# Patient Record
Sex: Female | Born: 1955
Health system: Southern US, Community
[De-identification: ages and names within clinical notes are randomized; demographics above are authoritative.]

## PROBLEM LIST (undated history)

## (undated) DIAGNOSIS — G609 Hereditary and idiopathic neuropathy, unspecified: Secondary | ICD-10-CM

## (undated) DIAGNOSIS — G43909 Migraine, unspecified, not intractable, without status migrainosus: Secondary | ICD-10-CM

## (undated) DIAGNOSIS — F32A Depression, unspecified: Secondary | ICD-10-CM

## (undated) DIAGNOSIS — G43719 Chronic migraine without aura, intractable, without status migrainosus: Secondary | ICD-10-CM

## (undated) DIAGNOSIS — G4710 Hypersomnia, unspecified: Secondary | ICD-10-CM

## (undated) DIAGNOSIS — N3281 Overactive bladder: Secondary | ICD-10-CM

## (undated) DIAGNOSIS — G4730 Sleep apnea, unspecified: Secondary | ICD-10-CM

## (undated) DIAGNOSIS — K219 Gastro-esophageal reflux disease without esophagitis: Secondary | ICD-10-CM

## (undated) DIAGNOSIS — G4700 Insomnia, unspecified: Secondary | ICD-10-CM

## (undated) DIAGNOSIS — E119 Type 2 diabetes mellitus without complications: Secondary | ICD-10-CM

## (undated) DIAGNOSIS — Z9889 Other specified postprocedural states: Secondary | ICD-10-CM

## (undated) DIAGNOSIS — F329 Major depressive disorder, single episode, unspecified: Secondary | ICD-10-CM

## (undated) DIAGNOSIS — IMO0001 Reserved for inherently not codable concepts without codable children: Secondary | ICD-10-CM

## (undated) DIAGNOSIS — M797 Fibromyalgia: Secondary | ICD-10-CM

## (undated) DIAGNOSIS — B1920 Unspecified viral hepatitis C without hepatic coma: Secondary | ICD-10-CM

## (undated) DIAGNOSIS — M545 Low back pain: Secondary | ICD-10-CM

## (undated) DIAGNOSIS — T7840XA Allergy, unspecified, initial encounter: Secondary | ICD-10-CM

## (undated) DIAGNOSIS — F17200 Nicotine dependence, unspecified, uncomplicated: Secondary | ICD-10-CM

## (undated) DIAGNOSIS — M1990 Unspecified osteoarthritis, unspecified site: Secondary | ICD-10-CM

## (undated) HISTORY — PX: HIP SURGERY: SHX245

## (undated) HISTORY — DX: Depression, unspecified: F32.A

## (undated) HISTORY — PX: PELVIC LAPAROSCOPY: SHX162

## (undated) HISTORY — DX: Chronic migraine without aura, intractable, without status migrainosus: G43.719

## (undated) HISTORY — DX: Other specified postprocedural states: Z98.890

## (undated) HISTORY — DX: Overactive bladder: N32.81

## (undated) HISTORY — DX: Unspecified viral hepatitis C without hepatic coma: B19.20

## (undated) HISTORY — DX: Fibromyalgia: M79.7

## (undated) HISTORY — DX: Low back pain: M54.5

## (undated) HISTORY — PX: SINUS SURGERY WITH INSTATRAK: SHX5215

## (undated) HISTORY — PX: COLONOSCOPY: SHX174

## (undated) HISTORY — DX: Insomnia, unspecified: G47.00

## (undated) HISTORY — DX: Sleep apnea, unspecified: G47.30

## (undated) HISTORY — DX: Gastro-esophageal reflux disease without esophagitis: K21.9

## (undated) HISTORY — DX: Unspecified osteoarthritis, unspecified site: M19.90

## (undated) HISTORY — DX: Allergy, unspecified, initial encounter: T78.40XA

## (undated) HISTORY — PX: FOOT SURGERY: SHX648

## (undated) HISTORY — DX: Hereditary and idiopathic neuropathy, unspecified: G60.9

## (undated) HISTORY — DX: Nicotine dependence, unspecified, uncomplicated: F17.200

## (undated) HISTORY — PX: EXPLORATORY LAPAROTOMY: SUR591

## (undated) HISTORY — DX: Migraine, unspecified, not intractable, without status migrainosus: G43.909

## (undated) HISTORY — DX: Type 2 diabetes mellitus without complications: E11.9

## (undated) HISTORY — DX: Reserved for inherently not codable concepts without codable children: IMO0001

## (undated) HISTORY — DX: Hypersomnia, unspecified: G47.10

## (undated) HISTORY — DX: Major depressive disorder, single episode, unspecified: F32.9

---

## 1978-01-13 DIAGNOSIS — B1920 Unspecified viral hepatitis C without hepatic coma: Secondary | ICD-10-CM

## 1978-01-13 HISTORY — DX: Unspecified viral hepatitis C without hepatic coma: B19.20

## 1997-08-06 ENCOUNTER — Inpatient Hospital Stay (HOSPITAL_COMMUNITY): Admission: AD | Admit: 1997-08-06 | Discharge: 1997-08-06 | Payer: Self-pay | Admitting: Gynecology

## 1997-08-17 ENCOUNTER — Ambulatory Visit (HOSPITAL_COMMUNITY): Admission: RE | Admit: 1997-08-17 | Discharge: 1997-08-17 | Payer: Self-pay | Admitting: Gynecology

## 1997-08-29 ENCOUNTER — Inpatient Hospital Stay (HOSPITAL_COMMUNITY): Admission: AD | Admit: 1997-08-29 | Discharge: 1997-08-29 | Payer: Self-pay | Admitting: Obstetrics and Gynecology

## 1997-10-25 ENCOUNTER — Inpatient Hospital Stay (HOSPITAL_COMMUNITY): Admission: AD | Admit: 1997-10-25 | Discharge: 1997-10-25 | Payer: Self-pay | Admitting: Gynecology

## 1997-10-26 ENCOUNTER — Inpatient Hospital Stay (HOSPITAL_COMMUNITY): Admission: AD | Admit: 1997-10-26 | Discharge: 1997-10-28 | Payer: Self-pay | Admitting: Gynecology

## 1997-11-02 ENCOUNTER — Encounter: Payer: Self-pay | Admitting: Obstetrics and Gynecology

## 1997-11-02 ENCOUNTER — Ambulatory Visit (HOSPITAL_COMMUNITY): Admission: RE | Admit: 1997-11-02 | Discharge: 1997-11-02 | Payer: Self-pay | Admitting: Obstetrics and Gynecology

## 1997-11-24 ENCOUNTER — Other Ambulatory Visit: Admission: RE | Admit: 1997-11-24 | Discharge: 1997-11-24 | Payer: Self-pay | Admitting: Gynecology

## 1999-01-14 HISTORY — PX: OTHER SURGICAL HISTORY: SHX169

## 1999-05-21 ENCOUNTER — Ambulatory Visit (HOSPITAL_COMMUNITY): Admission: RE | Admit: 1999-05-21 | Discharge: 1999-05-21 | Payer: Self-pay

## 1999-10-04 ENCOUNTER — Ambulatory Visit (HOSPITAL_COMMUNITY): Admission: RE | Admit: 1999-10-04 | Discharge: 1999-10-04 | Payer: Self-pay | Admitting: Gynecology

## 1999-10-04 ENCOUNTER — Encounter: Payer: Self-pay | Admitting: Gynecology

## 1999-10-08 ENCOUNTER — Other Ambulatory Visit: Admission: RE | Admit: 1999-10-08 | Discharge: 1999-10-08 | Payer: Self-pay | Admitting: Gynecology

## 1999-10-10 ENCOUNTER — Encounter: Payer: Self-pay | Admitting: Gynecology

## 1999-10-10 ENCOUNTER — Ambulatory Visit (HOSPITAL_COMMUNITY): Admission: RE | Admit: 1999-10-10 | Discharge: 1999-10-10 | Payer: Self-pay | Admitting: Gynecology

## 1999-10-22 ENCOUNTER — Encounter: Payer: Self-pay | Admitting: Gynecology

## 1999-10-22 ENCOUNTER — Ambulatory Visit (HOSPITAL_COMMUNITY): Admission: RE | Admit: 1999-10-22 | Discharge: 1999-10-22 | Payer: Self-pay | Admitting: Gynecology

## 1999-10-23 ENCOUNTER — Encounter (INDEPENDENT_AMBULATORY_CARE_PROVIDER_SITE_OTHER): Payer: Self-pay

## 1999-10-23 ENCOUNTER — Other Ambulatory Visit: Admission: RE | Admit: 1999-10-23 | Discharge: 1999-10-23 | Payer: Self-pay | Admitting: Gynecology

## 1999-10-29 ENCOUNTER — Encounter (INDEPENDENT_AMBULATORY_CARE_PROVIDER_SITE_OTHER): Payer: Self-pay | Admitting: *Deleted

## 1999-10-29 ENCOUNTER — Ambulatory Visit (HOSPITAL_COMMUNITY): Admission: RE | Admit: 1999-10-29 | Discharge: 1999-10-29 | Payer: Self-pay | Admitting: Gastroenterology

## 1999-10-29 DIAGNOSIS — Z9889 Other specified postprocedural states: Secondary | ICD-10-CM

## 1999-10-29 HISTORY — DX: Other specified postprocedural states: Z98.890

## 1999-10-30 ENCOUNTER — Encounter (INDEPENDENT_AMBULATORY_CARE_PROVIDER_SITE_OTHER): Payer: Self-pay | Admitting: Specialist

## 1999-10-30 ENCOUNTER — Inpatient Hospital Stay (HOSPITAL_COMMUNITY): Admission: RE | Admit: 1999-10-30 | Discharge: 1999-11-02 | Payer: Self-pay | Admitting: Gynecology

## 1999-10-30 ENCOUNTER — Encounter (INDEPENDENT_AMBULATORY_CARE_PROVIDER_SITE_OTHER): Payer: Self-pay

## 1999-10-30 HISTORY — PX: ABDOMINAL HYSTERECTOMY: SHX81

## 2001-02-17 ENCOUNTER — Other Ambulatory Visit: Admission: RE | Admit: 2001-02-17 | Discharge: 2001-02-17 | Payer: Self-pay | Admitting: Gynecology

## 2001-03-23 ENCOUNTER — Encounter: Admission: RE | Admit: 2001-03-23 | Discharge: 2001-03-23 | Payer: Self-pay | Admitting: Gynecology

## 2001-03-23 ENCOUNTER — Encounter: Payer: Self-pay | Admitting: Gynecology

## 2002-05-04 ENCOUNTER — Other Ambulatory Visit: Admission: RE | Admit: 2002-05-04 | Discharge: 2002-05-04 | Payer: Self-pay | Admitting: Gynecology

## 2003-10-23 ENCOUNTER — Other Ambulatory Visit: Admission: RE | Admit: 2003-10-23 | Discharge: 2003-10-23 | Payer: Self-pay | Admitting: Gynecology

## 2004-01-09 ENCOUNTER — Ambulatory Visit (HOSPITAL_COMMUNITY): Admission: RE | Admit: 2004-01-09 | Discharge: 2004-01-09 | Payer: Self-pay | Admitting: Neurology

## 2004-12-26 ENCOUNTER — Ambulatory Visit (HOSPITAL_COMMUNITY): Admission: RE | Admit: 2004-12-26 | Discharge: 2004-12-26 | Payer: Self-pay | Admitting: Preventative Medicine

## 2005-07-28 ENCOUNTER — Encounter: Admission: RE | Admit: 2005-07-28 | Discharge: 2005-07-28 | Payer: Self-pay | Admitting: Gynecology

## 2005-08-01 ENCOUNTER — Other Ambulatory Visit: Admission: RE | Admit: 2005-08-01 | Discharge: 2005-08-01 | Payer: Self-pay | Admitting: Gynecology

## 2005-08-15 ENCOUNTER — Encounter: Admission: RE | Admit: 2005-08-15 | Discharge: 2005-08-15 | Payer: Self-pay | Admitting: Gastroenterology

## 2006-03-26 ENCOUNTER — Ambulatory Visit (HOSPITAL_COMMUNITY): Admission: RE | Admit: 2006-03-26 | Discharge: 2006-03-26 | Payer: Self-pay | Admitting: Neurology

## 2006-09-22 ENCOUNTER — Other Ambulatory Visit: Admission: RE | Admit: 2006-09-22 | Discharge: 2006-09-22 | Payer: Self-pay | Admitting: Gynecology

## 2006-10-26 ENCOUNTER — Encounter: Admission: RE | Admit: 2006-10-26 | Discharge: 2006-10-26 | Payer: Self-pay | Admitting: Gynecology

## 2006-10-30 ENCOUNTER — Encounter: Admission: RE | Admit: 2006-10-30 | Discharge: 2006-10-30 | Payer: Self-pay | Admitting: Gynecology

## 2007-08-02 ENCOUNTER — Emergency Department (HOSPITAL_COMMUNITY): Admission: EM | Admit: 2007-08-02 | Discharge: 2007-08-02 | Payer: Self-pay | Admitting: Emergency Medicine

## 2007-10-12 ENCOUNTER — Emergency Department (HOSPITAL_COMMUNITY): Admission: EM | Admit: 2007-10-12 | Discharge: 2007-10-12 | Payer: Self-pay | Admitting: Emergency Medicine

## 2007-12-28 ENCOUNTER — Emergency Department (HOSPITAL_COMMUNITY): Admission: EM | Admit: 2007-12-28 | Discharge: 2007-12-28 | Payer: Self-pay | Admitting: Emergency Medicine

## 2008-05-07 ENCOUNTER — Encounter: Payer: Self-pay | Admitting: Orthopedic Surgery

## 2008-05-07 ENCOUNTER — Emergency Department (HOSPITAL_COMMUNITY): Admission: EM | Admit: 2008-05-07 | Discharge: 2008-05-07 | Payer: Self-pay | Admitting: Emergency Medicine

## 2008-05-10 ENCOUNTER — Ambulatory Visit: Payer: Self-pay | Admitting: Orthopedic Surgery

## 2008-05-10 DIAGNOSIS — S52539A Colles' fracture of unspecified radius, initial encounter for closed fracture: Secondary | ICD-10-CM | POA: Insufficient documentation

## 2008-05-17 ENCOUNTER — Ambulatory Visit: Payer: Self-pay | Admitting: Orthopedic Surgery

## 2008-05-25 ENCOUNTER — Ambulatory Visit: Payer: Self-pay | Admitting: Orthopedic Surgery

## 2008-05-31 ENCOUNTER — Ambulatory Visit: Payer: Self-pay | Admitting: Orthopedic Surgery

## 2008-06-22 ENCOUNTER — Ambulatory Visit: Payer: Self-pay | Admitting: Orthopedic Surgery

## 2008-07-26 ENCOUNTER — Ambulatory Visit: Payer: Self-pay | Admitting: Orthopedic Surgery

## 2008-08-02 ENCOUNTER — Encounter (HOSPITAL_COMMUNITY): Admission: RE | Admit: 2008-08-02 | Discharge: 2008-09-01 | Payer: Self-pay | Admitting: Orthopedic Surgery

## 2008-08-07 ENCOUNTER — Encounter: Payer: Self-pay | Admitting: Orthopedic Surgery

## 2008-08-18 ENCOUNTER — Encounter: Payer: Self-pay | Admitting: Orthopedic Surgery

## 2008-08-23 ENCOUNTER — Encounter: Payer: Self-pay | Admitting: Orthopedic Surgery

## 2008-08-28 ENCOUNTER — Encounter: Payer: Self-pay | Admitting: Orthopedic Surgery

## 2008-11-15 ENCOUNTER — Encounter: Payer: Self-pay | Admitting: Orthopedic Surgery

## 2009-01-29 ENCOUNTER — Ambulatory Visit (HOSPITAL_COMMUNITY): Admission: RE | Admit: 2009-01-29 | Discharge: 2009-01-29 | Payer: Self-pay | Admitting: Neurology

## 2009-10-24 ENCOUNTER — Ambulatory Visit: Payer: Self-pay | Admitting: Orthopedic Surgery

## 2009-10-24 DIAGNOSIS — M7660 Achilles tendinitis, unspecified leg: Secondary | ICD-10-CM

## 2009-10-24 DIAGNOSIS — B1710 Acute hepatitis C without hepatic coma: Secondary | ICD-10-CM | POA: Insufficient documentation

## 2009-10-25 ENCOUNTER — Encounter: Payer: Self-pay | Admitting: Orthopedic Surgery

## 2009-10-26 ENCOUNTER — Encounter: Payer: Self-pay | Admitting: Orthopedic Surgery

## 2009-11-01 ENCOUNTER — Encounter: Payer: Self-pay | Admitting: Orthopedic Surgery

## 2009-11-15 ENCOUNTER — Ambulatory Visit: Admission: AD | Admit: 2009-11-15 | Discharge: 2009-11-15 | Payer: Self-pay | Admitting: Neurology

## 2009-12-19 ENCOUNTER — Encounter (INDEPENDENT_AMBULATORY_CARE_PROVIDER_SITE_OTHER): Payer: Self-pay | Admitting: *Deleted

## 2010-02-03 ENCOUNTER — Encounter: Payer: Self-pay | Admitting: Gynecology

## 2010-02-04 ENCOUNTER — Encounter: Payer: Self-pay | Admitting: Gynecology

## 2010-02-12 NOTE — Letter (Signed)
Summary: *Orthopedic No Show Letter  [facility]  [address]  [place], [place] [address]   Phone: [phone]  Fax: [phone]    12/19/2009     Patricia Jones [address]  [place], [place] [address]        Dear [name],   Our records indicate that you missed your scheduled appointment with Dr. Beaulah Corin on December 06/2009. Please contact this office to reschedule your appointment as soon as possible.  It is important that you keep your scheduled appointments with your physician, so we can provide you the best care possible.        Sincerely,   Dr. Terrance Mass, MD [facility] Phone [phone]

## 2010-02-12 NOTE — Medication Information (Signed)
Summary: Prescription for a cane  Prescription for a cane   Imported By: Jacklynn Ganong 10/25/2009 07:59:46  _____________________________________________________________________  External Attachment:    Type:   Image     Comment:   External Document

## 2010-02-12 NOTE — Letter (Signed)
Summary: Pjhysician's order for equipment  Pjhysician's order for equipment   Imported By: Jacklynn Ganong 11/01/2009 16:54:32  _____________________________________________________________________  External Attachment:    Type:   Image     Comment:   External Document

## 2010-02-12 NOTE — Letter (Signed)
Summary: History form  History form   Imported By: Jacklynn Ganong 10/26/2009 08:28:34  _____________________________________________________________________  External Attachment:    Type:   Image     Comment:   External Document

## 2010-02-12 NOTE — Assessment & Plan Note (Signed)
Summary: RT ANKLE PAIN NEEDS XR/HUMANA/DOONQUAH/BSF   Visit Type:  new problem Referring Provider:  Dr. Gerilyn Pilgrim  CC:  right ankle pain.  History of Present Illness: I saw Patricia Jones in the office today for an initial visit.  She is a 55 years old woman with the complaint of:  right ankle pain.  Xrays today.  Medications: Methadone, Gabapentin, Adderal, Fioricet, Sumatriptan injection, Soma, Prozac, Valium, Tylenol.  The patient has pain in the Achilles tendon of her RIGHT ankle which is exacerbated by ambulation.  Sometimes the swelling will go down when she has stayed off of it for a period of time.  She is followed for chronic pain by a local neurologist.  At this point she has tried some ibuprofen, ice and bracing but continues to have discomfort.  Her pain is rated 9/10 and is described as constant.  She does not recall any injury.  Pain is present for 3 months  Allergies: 1)  ! Penicillin 2)  ! Sulfa  Past History:  Past Surgical History: Last updated: 05/10/2008 hysterectomy sinus surgery laproscopic left hip  Family History: Last updated: 05/10/2008 NA  Social History: Last updated: 05/10/2008 Patient is divorced.  unemployed  Risk Factors: Alcohol Use: 0 (05/10/2008) Caffeine Use: 3 (05/10/2008)  Risk Factors: Smoking Status: current (05/10/2008) Packs/Day: 0.5 (05/10/2008)  Past Medical History: fibromyalgia sleep apnea migraines chronic fatique depression Hepatitis C  Family History: Reviewed history from 05/10/2008 and no changes required. NA  Social History: Reviewed history from 05/10/2008 and no changes required. Patient is divorced.  unemployed  Review of Systems Constitutional:  Complains of fatigue; denies weight loss, weight gain, fever, and chills. Cardiovascular:  Denies chest pain, palpitations, fainting, and murmurs. Respiratory:  Complains of short of breath and snoring; denies wheezing, couch, tightness, pain on  inspiration, and snoring . Gastrointestinal:  Complains of heartburn, nausea, and vomiting; denies diarrhea, constipation, and blood in your stools. Genitourinary:  Complains of frequency, urgency, difficulty urinating, flank pain, and bleeding in urine; denies painful urination. Neurologic:  Complains of numbness, tingling, and unsteady gait; denies dizziness, tremors, and seizure. Musculoskeletal:  Complains of joint pain, swelling, stiffness, and muscle pain; denies instability. Endocrine:  Complains of heat or cold intolerance; denies excessive thirst and exessive urination. Psychiatric:  Complains of depression; denies nervousness, anxiety, and hallucinations. Skin:  Denies changes in the skin, poor healing, rash, itching, and redness. HEENT:  Denies blurred or double vision, eye pain, redness, and watering. Immunology:  Complains of seasonal allergies; denies sinus problems and allergic to bee stings. Hemoatologic:  Denies easy bleeding and brusing.  Physical Exam  Additional Exam:  this is a well-developed does somewhat contact slender female in no acute distress but is lying down on her side on the exam table when I came in the room saying that she is tired.  He has good pulse in perfusion no swelling or pretibial edema in the RIGHT lower extremity.  Her skin is intact no rashes.  Normal sensation is noted.  She is awake and alert his joint x3 her mood is calm.  She in place with a slight limp somewhat on her toes.  Inspection revealed swelling and tenderness over the Achilles, the plantar fascia is nontender the peroneal tendons are nontender the posterior tibial tendon nontender.  There is swelling at the Achilles insertion posteriorly there is no pump bump or gallops.  Tendon continuity feels good Janee Morn test is negative for rupture ankle range of motion is normal, plantarflexion strength normal.  Ankle joint stable.   Impression & Recommendations:  Problem # 1:  ACHILLES  TENDINITIS (ICD-726.71) Assessment New  x-rays were obtained of the RIGHT ankle 3 views AP lateral and oblique  Findings no bone spurs or calcifications are noted in the tendon.  The plantar fifth surface of the heel is normal as well.  Ankle joint looks good.  Impression normal ankle joint x-ray  Impression Achilles tendinitis recommend rest protected weightbearing, bracing, return in 6 weeks  Orders: Est. Patient Level IV ([address]109) Ankle x-ray complete,  minimum 3 views (60454)  Patient Instructions: 1)  WEAR CAM WALKER X 6 WEEKS  2)  CANE LEFT HAND  3)  RETURN 6 WEEKS

## 2010-02-15 NOTE — Letter (Signed)
Summary: Referral notes from Dr. Gerilyn Pilgrim  Referral notes from Dr. Gerilyn Pilgrim   Imported By: Jacklynn Ganong 10/26/2009 08:28:03  _____________________________________________________________________  External Attachment:    Type:   Image     Comment:   External Document

## 2010-03-05 ENCOUNTER — Other Ambulatory Visit: Payer: Self-pay | Admitting: Family Medicine

## 2010-03-05 DIAGNOSIS — Z1231 Encounter for screening mammogram for malignant neoplasm of breast: Secondary | ICD-10-CM

## 2010-05-22 ENCOUNTER — Ambulatory Visit: Payer: Self-pay | Admitting: Urgent Care

## 2010-05-31 NOTE — H&P (Signed)
[facility]  Patient:    Patricia Jones, Patricia Jones                       MRN: [id number] Attending:  Gaetano Hawthorne. Lily Peer, M.D.                         History and Physical  The patient is scheduled for surgery tomorrow at 1 p.m.  Please have History & Physical available.  CHIEF COMPLAINT: 1. Chronic left lower quadrant pain. 2. Left ovarian mass.  HISTORY:  The patient is a 55 year old, gravida 4, para 2, AB1, who was seen in the office on October 04, 1999, complaining on left lower quadrant pain that had been worse for several weeks.  Review of her records indicated that she was seen on February 15, 1999, for similar complaints and was found to have on ultrasound an ovary that had a left complex cystic mass measuring 37 x 26 x 38 mm with a bright echogenic internal echo in a reticular pattern with multiple septations.  There was questionable entertained diagnosed of a hemorrhagic cyst, and she was instructed to return for followup ultrasound and did not do so until September.  Her right ovary was reported to be normal. Her uterus had a frontal anterior fibroid measuring 28 x 13 x 27 mm with little calcification noted.  On examination on September 21, she was exquisitely tender in the left adnexa but no rebound or guarding.  She had good bowel sounds.  On pelvic examination, it was difficult to assess her adnexa adequately, so an ultrasound was performed to compare with previous ultrasound of 7 months prior.  The ultrasound demonstrated that the left adnexa mass had a complex cyst which had increased in size to 48 x 46 x 43 mm with a rim of ovarian tissue seen displaced peripherally. There was internal debris and layering inside the cyst, and the right ovary was normal.  The uterus essentially remained the same with the previously mentioned fibroid.  On further questioning, the patient stated that her sister was diagnosed with ovarian cancer at the age of 37 and  died at the age of 67.  In addition, as part of her workup, she had an abdominal and pelvic CT scan. The abdominal CT demonstrated no abnormality noted, no evidence of pelvic or periaortic lymphadenopathy.  The pelvic CT scan demonstrated cystic mass in the left adnexa measuring 5.3 x 4. 6 cm, no evidence of pelvic lymphadenopathy or ascites.  Due to the fact that she was also a chronic smoker, a chest x-ray was done.  No active disease was noted; only scoliosis was described.  As part f her evaluation, she also had a double contrast barium enema, and it was noted there was an approximately 1.5 cm filling defect in the ascending colon immediately adjacent to the to the cecum.  This filling defect had a rounded contour.  Given the lack of mobility, the appearance was most consistent with a polyp.  There was an 8 mm filling defect in the distal portion of the transverse colon as well which could reflect a small polyp as well.  She was asked to see Dr. Anselmo Rod, gastroenterologist, who prepped her the day before a planned colonoscopy and received a telephone call today, October 16, stating that no polyps were seen in the area described n the barium enema, but she had a small polyps in the rectosigmoid area at  10 cm which were biopsied, and she thought that it looked totally benign.  Of note, the pathology report is still pending since this was just done today.  Due to her sister with history of ovarian cancer and this persistent ovarian cyst, tumor markers were also obtained such as CA125, HCG, and alpha-fetoprotein which were all described to be normal.  Her mammogram done in October of this year was reported to be normal as well.  Due to the fact that recently, besides her left lower quadrant pain, she was complaining of menorrhagia, an endometrial biopsy was done on October 10 which demonstrated weakly secretory endometrium associated with degenerative changes consistent with  breakthrough bleeding associated with hormonal therapy.  The patient was not using any form of contraception but was placed on Megace 20 mg b.i.d. to stop her bleeding prior to her surgery.  She then had a normal hemoglobin and hematocrit reported, and her Pap smear also was normal on October 08, 1999. She was seen in the office recently, on October 10, for vaginal light discharge, and she was found to have evidence of bacterial vaginosis, and she was placed on clindamycin 300 mg b.i.d. for a 7-day course.  She also has complained of a lot of pelvic pain and has been on normal narcotics as well as nonsteroidals with minimal relief of the symptoms.  Due to her insurance requesting a second opinion, she had seen a Dr. Helane Gunther who concurred with the planned operation of total abdominal hysterectomy with bilateral salpingo-oophorectomy.  PAST MEDICAL HISTORY:  She had menarche at the age of 22.  She had an injury whereby she fell off a horse and had a nondisplaced fracture at the age of 80 of the right ileum and inferior pubic ramus on the left with no subsequent sequelae reported.  She has been a chronic smoker for many years.  She is using withdrawal as form of contraception.  During her pregnancy, she had preterm labor and also had gestational diabetes.  ALLERGIES:  PENICILLIN, SULFA, and VALIUM.  PAST SURGICAL HISTORY:  She has had multiple operations to include exploratory laparoscopy three times as well as exploratory laparotomy twice at which time she had ovarian cyst, pelvic adhesions, tubal disease requiring tuboplasty, as well as endometriosis being encountered at various times.  She stated she had been transfused after one of her surgeries and received 2 units of blood. She has complained as well of dysmenorrhea and heavy periods and dyspareunia.  FAMILY HISTORY:  Sister with ovarian cancer was diagnosed at age 40, and she died at the age of 92.  Mother has history of  heart disease and father with some form of cancer.  The patient does not recall the name.  She has five brothers and three sisters.  PHYSICAL EXAMINATION:  GENERAL:  Thin, white female.   HEENT:  Unremarkable.  NECK:  Supple.  Trachea midline.  No carotid bruits, no thyromegaly.  LUNGS:  Clear to auscultation without rhonchi or wheezes.  HEART:  Regular rate and rhythm without any murmurs or gallops.  BREASTS:  Examination done during her annual visit had been reported to be normal.  ABDOMEN:  Soft with tenderness in the left lower quadrant but no rebound or guarding.  PELVIC:  Bartholin, urethral, and Skene within normal limits.  Vagina and cervix without any gross lesions, recently being treated for bacterial vaginosis.  Uterus was upper limits of normal, anteverted.  Left adnexa difficult to evaluate due to the patients tenderness.  Right adnexa had no palpable masses or tenderness.  RECTAL:  Examination concurred.  ASSESSMENT:  A 55 year old gravida 3, para 2, AB1, with chronic worsening pelvic pain to left lower quadrant, enlarging ovarian cyst from February of this year to present ultrasound done in September of this year.  The patient has a strong family history of ovarian cancer, with sister diagnosed at the age of 25 and died at age of 32.  The patient had negative workup such as tumor markers, barium enema, suspicious for colonic polyps which was followed up with a colonoscopy on October 16 with two small polyps removed from the rectosigmoid area.  Pathology report pending at this time.  Abdominal and pelvic CT scan without evidence of abdominal or pelvic lymphadenopathy.  We had gone through an extensive discussion with the patient.  Initially, we had planned for an exploratory laparotomy, total abdominal hysterectomy, and left salpingo-oophorectomy; but, due to the fact that the patient is now 25 years of age and her sister with ovarian cancer, she would like  to proceed with bilateral salpingo-oophorectomy regardless of the findings intraoperatively.  She knows that she will be need to be placed on estrogen replacement therapy for the remainder of her life since she had had multiple operations.  She will be on a GoLYTELY bowel prep before her surgery.  She will receive prophylaxis antibiotic to prevent infection.  She will also have pneumatic compression stockings to prevent DVT and subsequently pulmonary embolism.  Potential scenarios from the operation such as the complications that we may encounter due to multiple pelvic surgeries she has had in the past include infection, hemorrhage which may require blood transfusion with potential risk of hepatitis, AIDS, and anaphylactic reaction, also in the event of any trauma to internal organs such as to the bladder, intestines, any corrective surgery such as colostomy, segmental resection.  In the event that this is a malignancy, the patient is fully aware that she will need to undergo a complete staging procedure to include periaortic and pelvic lymphadenectomy along with partial omentectomy as well as a TAH-BSO.  The patient is aware of all of the above and potential complications and accepts and will proceed with such procedure.  PLAN:  The patient is scheduled for total abdominal hysterectomy with bilateral salpingo-oophorectomy and possible staging procedure on Wednesday, October 17, at 1 p.m. at [facility].  Please have History & Physical available. DD:  10/29/99 TD:  10/29/99 Job: [id number]

## 2010-05-31 NOTE — Op Note (Signed)
[facility]  Patient:    Patricia Jones, Patricia Jones                MRN: [id number] Proc. Date: 10/30/99 Adm. Date:  [id number] Disc. Date: [id number] Attending:  Charna Elizabeth                           Operative Report  PREOPERATIVE DIAGNOSES: 1. Left pelvic mass. 2. Menorrhagia. 3. Pelvic pain.  POSTOPERATIVE DIAGNOSES: 1. Left pelvic pain. 2. Menorrhagia. 3. Pelvic pain. 4. Pelvic adhesions.  PROCEDURE PERFORMED: 1. Escharotomy. 2. Pelvic adhesiolysis. 3. Total abdominal hysterectomy and bilateral salpingo-oophorectomy.  SURGEON:  Juan H. Lily Peer, M.D.  Mammie LorenzoRande Brunt. Clarke-Pearson, M.D.  INDICATION FOR OPERATION:  A 55 year old gravida 4, para 2, AB 1, with left pelvic mass, chronic pelvic pain, and menorrhagia.  FINDINGS:  A left ovarian cyst measured 3 x 3 cm, encased in pelvic adhesions and adhered to the left pelvic sidewall, involving the sigmoid colon.  Normal right tube and ovary and uterus.  DESCRIPTION OF PROCEDURE:  After the patient was adequately counseled, she came to the operating room, where she underwent a successful general endotracheal anesthesia.  Her abdomen was prepped and draped in the usual sterile fashion. A Foley catheter had been previously placed for monitoring of urinary output.  The previous midline scar was excised and with a separate scalpel, the incision was carried down from the subcutaneous tissue down to the rectus fascia in midline.  A nick was made, and the fascia was incised from a cephalic to a caudal fashion, and the peritoneal cavity was entered. The Buchwalter retractors were in place after the patient was placed in the Trendelenburg position.  Inspection of the pelvic cavity demonstrated left ovarian mass adhered to the left pelvic sidewall with adherence of the sigmoid colon.  Meticulous dissection was required in an effort to free the left ovary from the sigmoid colon, keeping in full view  the left ureter.  The left infundibulopelvic ligament was identified and was clamped x 2 and excised and the proximal portion suture ligated x 2 with 0 Vicryl suture.  After this, since the left round ligament had been transected, the anterior broad ligament was incised to the level of the internal cervical os.  After skeletonization of the parametrial tissue, a Heaney clamp was placed and the remaining broad ligament and cardinal ligament were clamped, cut, and suture ligated with 0 Vicryl suture to the level of the left fornix.  Attention was then placed to the right round ligament, which was transected.  The anterior broad ligament was incised to the level of the anterior cervical os.  The right ureter was identified, and the right infundibulopelvic ligament was brought into view and was clamped, cut, and suture ligated with 0 Vicryl suture x 2.  After skeletonization, the remainder of the broad ligament and cardinal ligaments were serially clamped, cut, and suture ligated to the level of the right vaginal fornix.  At this time, curved Heaney clamps were utilized and placed at the angles, and with the use of Satinsky scissors, the cervix was excised from the vagina and passed off the operative field.  Of note, previous to this, the left ovarian mass had been passed off the operative field after it had been dissected away from the sigmoid colon, and it had been submitted for frozen sample.  Dr. Elijah Birk _____, the pathologist, had notified us and informed us that this was  a benign process, so no further additional procedure was required at this time.  Of note, also the patient did have pelvic washings, which were also submitted for histologic evaluation.  The angle sutures were securely ligated with 0 Vicryl sutures, and the remaining vaginal cuff was secured with interrupted sutures of 0 Vicryl suture.  The pelvic cavity was copiously irrigated with normal saline solution.  Individual bleeders  were Bovie cauterized, and the raw surfaces of both pelvic sidewalls were protected with individual layers of Interceed to prevent further adhesion formation in those areas.  The sponge count and needle count were correct, and the Buchwalter retractors were removed.  The visceral peritoneum was not reapproximated, and the fascia was closed with a running stitch of 0 PDS in a running stitch fashion.  The subcutaneous tissue was approximated with 2-0 Vicryl suture, and the skin was reapproximated with staples.  Xeroform gauze was placed along with _____ dressing.  The patient was extubated and transferred to the recovery room with stable vital signs.  Blood loss for the procedure was reported to be minimal, and IV fluid was 1650 cc of lactated Ringers, and urine output was 200 cc and clear.  The patient did receive 1 g of Cefotan prophylactically preoperatively, and she had pneumatic compression stockings to prevent DVT. DD:  10/30/99 TD:  10/31/99 Job: [id number]

## 2010-05-31 NOTE — Procedures (Signed)
[facility]  Patient:    Patricia Jones, Patricia Jones                MRN: [id number] Proc. Date: 10/29/99 Adm. Date:  [id number] Attending:  Charna Elizabeth CC:         Gaetano Hawthorne. Lily Peer, M.D.                           Procedure Report  DATE OF BIRTH:  24-Oct-1955  REFERRING PHYSICIAN:  Gaetano Hawthorne. Lily Peer, M.D.  PROCEDURE PERFORMED:  Colonoscopy with hot biopsies x 2.  ENDOSCOPIST:  Anselmo Rod, M.D.  INSTRUMENT USED:  Olympus video colonoscope.  INDICATIONS FOR PROCEDURE:  The patient is a 55 year old white female scheduled for a hysterectomy on October 30, 1998 and was found to have abnormal barium enema done for work-up of diarrhea.  The patient was found to have a filling defect in the ascending colon and transverse colon. Colonoscopy was being done for possible polypectomy preoperatively.  The patient has had a change in bowel habits with more diarrhea in the recent past and also has a family history of colon cancer.  PREPROCEDURE PREPARATION:  Informed consent was procured from the patient. The patient was fasted for eight hours prior to the procedure and prepped with a bottle of magnesium citrate and a gallon of NuLytely the night prior to the procedure.  PREPROCEDURE PHYSICAL:  The patient had stable vital signs.  Neck supple. Chest clear to auscultation.  S1, S2 regular.  Abdomen soft with normal abdominal bowel sounds.  DESCRIPTION OF PROCEDURE:  The patient was placed in the left lateral decubitus position and sedated with 50 mg of Demerol and 5 mg of Versed intravenously.  Once the patient was adequately sedated and maintained on low-flow oxygen and continuous cardiac monitoring, the Olympus video colonoscope was advanced from the rectum to the cecum without difficulty.  The patient had an excellent prep.  The patient had no masses, polyps, erosions, ulcerations in the cecum, right colon, transverse colon or descending  colon except for two diminutive polyps in the rectosigmoid at 10 cm.  The rest of the exam was normal.  The diminutive polyp was removed by hot biopsy forceps.  IMPRESSION:  Normal colonoscopy except for two diminutive polyps removed by hot biopsy forceps from 10 cm.  RECOMMENDATIONS: 1. Proceed with hysterectomy in the morning. 2. Considering the patients family history of colon cancer, repeat    colorectal  cancer screening is recommended in the next five years or    earlier if need be.DD:  10/29/99 TD:  10/30/99 Job: [id number]

## 2010-05-31 NOTE — Discharge Summary (Signed)
[facility]  Patient:    Patricia Jones, Patricia Jones                MRN: [id number] Adm. Date:  [id number] Disc. Date: [id number] Attending:  Tonye Royalty                           Discharge Summary  HISTORY OF PRESENT ILLNESS:  The patient is a 55 year old gravida 4, para 2, AB 1 who on the morning of October 17 underwent total abdominal hysterectomy with bilateral salpingo-oophorectomy along with escharatomy along with pelvic adhesiolysis secondary to a left pelvic mass, menorrhagia and chronic pelvic pain. Findings during this surgery demonstrated an ovarian cyst that measured 3.5 cm in size encased in pelvic adhesions to the left pelvic side wall wrapped around the sigmoid colon. A small cyst on both ovaries. The patient tolerated the procedure well. There was minimal blood loss. Intraoperatively, she had received a gram of Cefotan prophylaxis. Postoperatively she did well. Tmax was 99.2, hemoglobin and hematocrit were 11.9 and 35.1 respectively with a platelet count of 197,000. Urine output in the previous 24 hours had been adequate and clear. She was started on Climara transdermal patch 0.1 mg to apply q. weekly and was started on a clear liquid diet. On her second postoperative day, she continued to do well and her diet was advanced in the evening to a regular diet. She was up and ambulating. Her PCA pump had already been discontinued and she was voiding well. On her third postoperative day, she had already passed flatus, her vital signs were stable, she continued to remain afebrile. She did experience still some hot flashes despite being on Climara transdermal patch. She was ambulating, voiding well and tolerating a regular diet well and was ready to be discharged home. Her final path report had demonstrated the left ovary and fallopian tube benign hemorrhagic cyst, unremarkable fallopian tube, no evidence of malignancy. The cervix without  any evidence of dysplasia, endometrium, no evidence of hyperplasia or malignancy and uterus without evidence of submucosal intramural subserosal leiomyoma and adenomyosis was identified but no malignancy. Right ovary benign hemorrhagic cyst and benign follicular cyst, no evidence of malignancy and unremarkable right fallopian tube. Some of the hemosiderin deposits that were noted that werent suspicious for endometriosis of both ovaries. These will concur with the patients previous history of previous surgeries where this had been diagnosed as well as family history.  PROCEDURES PERFORMED: 1. Total abdominal hysterectomy and bilateral salpingo-oophorectomy. 2. Pelvic adhesiolysis. 3. Escharatomy.  FINAL DIAGNOSES:  Adenomyosis, uterine leiomyoma and hemorrhagic ovarian cyst.  FOLLOW-UP AND DISPOSITION:  The patient was discharged home on the third postoperative day. She was up ambulating well tolerating a regular diet well in good condition to be discharged and was afebrile. She was asked to discontinue the Climara transdermal patch tomorrow and to start estrotest 1 tablet p.o. q. daily. Also calcium supplementation consisting of 1200-1500 mg q. daily. She will return to the office on October 23 or 24 to have her staples removed. She was given discharge instructions and dos and donts and will follow in the office accordingly. DD:  11/02/99 TD:  11/03/99 Job: [id number]

## 2010-06-17 ENCOUNTER — Encounter: Payer: Self-pay | Admitting: Internal Medicine

## 2010-06-27 ENCOUNTER — Ambulatory Visit: Payer: Self-pay | Admitting: Urgent Care

## 2010-07-11 ENCOUNTER — Encounter: Payer: Self-pay | Admitting: Urgent Care

## 2010-07-11 ENCOUNTER — Ambulatory Visit (INDEPENDENT_AMBULATORY_CARE_PROVIDER_SITE_OTHER): Payer: Self-pay | Admitting: Urgent Care

## 2010-07-11 VITALS — BP 128/73 | HR 86 | Temp 98.0°F | Ht 68.0 in | Wt 162.0 lb

## 2010-07-11 DIAGNOSIS — K219 Gastro-esophageal reflux disease without esophagitis: Secondary | ICD-10-CM | POA: Insufficient documentation

## 2010-07-11 DIAGNOSIS — Z860100 Personal history of colon polyps, unspecified: Secondary | ICD-10-CM

## 2010-07-11 DIAGNOSIS — B1920 Unspecified viral hepatitis C without hepatic coma: Secondary | ICD-10-CM

## 2010-07-11 MED ORDER — OMEPRAZOLE 20 MG PO CPDR: 20.0000 mg | DELAYED_RELEASE_CAPSULE | Freq: Every day | ORAL | Status: DC

## 2010-07-11 NOTE — Assessment & Plan Note (Addendum)
Patricia Jones is a 55 y.o. caucasian female w/ chronic GERD for many yrs.  Not currently on PPI.  No hx EGD.  Given worsening symptoms, will need further evaluation w/ EGD to r/o complicated GERD, PUD, gastritis or less likely malignancy.  Concerns due to abdominal bloating/distention w/ CPAP & I suspect this is due to pt swallowing air at night.    Resume omeprazole 20mg  daily. GERD literature & diet. I have discussed risks & benefits which include, but are not limited to, bleeding, infection, perforation & drug reaction.  The patient agrees with this plan & written consent will be obtained.   EGD and possibly colonoscopy (if due) in the near future in the OR.  Procedure will need to be done with deep sedation (propofol) in the OR under the direction of anesthesia services for hx chronic use of multiple psychoactive medications.

## 2010-07-11 NOTE — Progress Notes (Signed)
Cc to pcp °

## 2010-07-11 NOTE — Patient Instructions (Addendum)
1-800-QUIT-NOW Begin omeprazole 20 mg daily 30 min before breakfast  Diet for GERD or PUD Nutrition therapy can help ease the discomfort of gastroesophageal reflux disease (GERD) and peptic ulcer disease (PUD).  HOME CARE INSTRUCTIONS  Eat your meals slowly, in a relaxed setting.   Eat 5 to 6 small meals per day.   If a food causes distress, stop eating it for a period of time.  FOODS TO AVOID:  Coffee, regular or decaffeinated.  Cola beverages, regular or low calorie.   Tea, regular or decaffeinated.   Pepper.   Cocoa.   High fat foods including meats.   Butter, margarine, hydrogenated oil (trans fats).  Peppermint or spearmint (if you have GERD).   Fruits and vegetables as tolerated.   Alcoholic beverages.   Nicotine (smoking or chewing). This is one of the most potent stimulants to acid production in the gastrointestinal tract.   Any food that seems to aggravate your condition.   If you have questions regarding your diet, call your caregiver's office or a registered dietitian. OTHER TIPS IF YOU HAVE GERD:  Lying flat may make symptoms worse. Keep the head of your bed raised 6 to 9 inches by using a foam wedge or blocks under the legs of the bed.   Do not lay down until 3 hours after eating a meal.   Daily physical activity may help reduce symptoms.  MAKE SURE YOU:   Understand these instructions.   Will watch your condition.   Will get help right away if you are not doing well or get worse.  Document Released: 12/30/2004 Document Re-Released: 05/18/2008 [facility] Patient Information 2011 [place], [place]. Acid Reflux (GERD) Acid reflux is also called gastroesophageal reflux disease (GERD). Your stomach makes acid to help digest food. Acid reflux happens when acid from your stomach goes into the tube between your mouth and stomach (esophagus). Your stomach is protected from the acid, but this tube is not. When acid gets into the tube, it may cause a burning  feeling in the chest (heartburn). Besides heartburn, other health problems can happen if the acid keeps going into the tube. Some causes of acid reflux include:  Being overweight.   Smoking.   Drinking alcohol.   Eating large meals.   Eating meals and then going to bed right away.   Eating certain foods.   Increased stomach acid production.  HOME CARE  Take all medicine as told by your doctor.   You may need to:   Lose weight.   Avoid alcohol.   Quit smoking.   Do not eat big meals. It is better to eat smaller meals throughout the day.   Do not eat a meal and then nap or go to bed.   Sleep with your head higher than your stomach.   Avoid foods that bother you.   You may need more tests, or you may need to see a special doctor.  GET HELP RIGHT AWAY IF:  You have chest pain that is different than before.   You have pain that goes to your arms, jaw, or between your shoulder blades.   You throw up (vomit) blood, dark brown liquid, or your throw up looks like coffee grounds.   You have trouble swallowing.   You have trouble breathing or cannot stop coughing.   You feel dizzy or pass out.   Your skin is cool, wet, and pale.   Your medicine is not helping.  MAKE SURE YOU:   Understand  these instructions.   Will watch your condition.   Will get help right away if you are not doing well or get worse.  Document Released: 06/18/2007 Document Re-Released: 03/26/2009 [facility] Patient Information 2011 [place], [place].

## 2010-07-11 NOTE — Assessment & Plan Note (Signed)
May be due for repeat colonoscopy given hx ? Adenomatous polyps. Will request records/biopsy report from Dr Arty Baumgartner in [place].

## 2010-07-11 NOTE — Assessment & Plan Note (Signed)
Carries hx of transmission years ago with documented jaundice & hospitalization.  Few years ago, pt says she had no active viremia.

## 2010-07-11 NOTE — Progress Notes (Signed)
Referring Provider: Gerilyn Pilgrim Primary Care Physician:  Beryle Beams, MD Primary Gastroenterologist:  Dr. Darrick Penna  Chief Complaint  Patient presents with  . Gastrophageal Reflux    cpap fills stomach with air     HPI:  Patricia Jones is a 55 y.o. female here as a referral from Dr. Gerilyn Pilgrim for GERD.  Pt gives hx chronic GERD.  Worse recently.  "If I lean forward everything rolls up throat & comes out nose."  C/o regurgitation several times per week.  +Water brash.  Symptoms worse at night.  Denies nausea or vomiting.  C/o lots of bloating w/ use of c pap & severe flatulence and gas pains after awakening.  Denies dysphagia, odynophagia.  Appetite ok.  Heartburn & indigestion several times per week.  Tried TUMS.  Has been off/on PPIs for multiple yrs, none recently.  Wt loss 20# unintentional x 1 yr.  Tried prilosec in past, seemed to help some.   BM normal.  No constipation, occasional diarrhea 3-5 stools/day.  Worse w/ certain foods w/ urgency.  Denies rectal bleeding or melena. Hx colonoscooy in [place] 2006 (?) believes she is overdue for follow-up on polyps.  Past Medical History  Diagnosis Date  . Depression   . Migraines   . Fibromyalgia   . Sleep apnea     CPAP  . S/P colonoscopy 2006/2001    Dr Tito Dine?  . GERD (gastroesophageal reflux disease)   . Hepatitis C 1980    symptomatic w/ jaundice, low viral load later, never treated per pt   Past Surgical History  Procedure Date  . Complete hyst 2001  . Sinus surgery with instatrak   . Hip surgery     laproscopic left   Current Outpatient Prescriptions  Medication Sig Dispense Refill  . amphetamine-dextroamphetamine (ADDERALL) 20 MG tablet Take 20 mg by mouth daily.        . butalbital-acetaminophen-caffeine (FIORICET, ESGIC) 50-325-40 MG per tablet       . carisoprodol (SOMA) 350 MG tablet       . diazepam (VALIUM) 5 MG tablet       . FLUoxetine (PROZAC) 40 MG capsule Take 20 mg by mouth Daily.      Marland Kitchen gabapentin  (NEURONTIN) 600 MG tablet       . methadone (DOLOPHINE) 5 MG tablet       . methylPREDNIsolone (MEDROL DOSPACK) 4 MG tablet       . SUMAtriptan (IMITREX) 6 MG/0.5ML SOLN       . omeprazole (PRILOSEC) 20 MG capsule Take 1 capsule (20 mg total) by mouth daily.  30 capsule  5   Allergies as of 07/11/2010 - Review Complete 07/11/2010  Allergen Reaction Noted  . Penicillins Rash   . Sulfonamide derivatives Rash 05/10/2008   Family History  Problem Relation Age of Onset  . Aneurysm Mother   . Coronary artery disease Father   . Stroke Father   . Ovarian cancer Sister    History   Social History  . Marital Status: Divorced    Spouse Name: N/A    Number of Children: 2  . Years of Education: N/A   Occupational History  . disabled      previously RN @ [employer]   Social History Main Topics  . Smoking status: Current Everyday Smoker -- 1.0 packs/day for 40 years    Types: Cigarettes  . Smokeless tobacco: Not on file  . Alcohol Use: No     heavy etoh x 30 yrs, quit 14  yrs ago  . Drug Use: No     maijuana use  . Sexually Active: Not on file  Review of Systems: Gen: c/o chronic fatigue & body aches w/ fibromyalgia. CV: Denies chest pain, angina, palpitations, syncope, orthopnea, PND, peripheral edema, and claudication. Resp: Denies dyspnea at rest, dyspnea with exercise, cough, sputum, wheezing, coughing up blood, and pleurisy. GI: Denies vomiting blood, jaundice, and fecal incontinence.    GU : Denies urinary burning, blood in urine, urinary frequency, urinary hesitancy, nocturnal urination, and urinary incontinence. MS: Chronic aches, pains, joint pain, back pain.  Derm: Denies rash, itching, dry skin, hives, moles, warts, or unhealing ulcers.  Psych: Denies depression, anxiety, memory loss, suicidal ideation, hallucinations, paranoia, and confusion. Heme: Denies bruising, bleeding, and enlarged lymph nodes.  Physical Exam: BP 128/73  Pulse 86  Temp(Src) 98 F (36.7 C) (Temporal)   Ht 5\' 8"  (1.727 m)  Wt 162 lb (73.483 kg)  BMI 24.63 kg/m2 General:   Alert,  Well-developed, well-nourished, pleasant and cooperative in NAD Head:  Normocephalic and atraumatic. Eyes:  Sclera clear, no icterus.   Conjunctiva pink. Ears:  Normal auditory acuity. Nose:  No deformity, discharge,  or lesions. Mouth:  No deformity or lesions, dentition normal. Neck:  Supple; no masses or thyromegaly. Lungs:  Clear throughout to auscultation.   No wheezes, crackles, or rhonchi. No acute distress. Heart:  Regular rate and rhythm; no murmurs, clicks, rubs,  or gallops. Abdomen:  Soft, nontender and nondistended. No masses, hepatosplenomegaly or hernias noted. Normal bowel sounds, without guarding, and without rebound.   Rectal:  Deferred until time of colonoscopy.   Msk:  Symmetrical without gross deformities. Normal posture. Pulses:  Normal pulses noted. Extremities:  Without clubbing or edema. Neurologic:  Alert and  oriented x4;  grossly normal neurologically. Skin:  Intact without significant lesions or rashes. Cervical Nodes:  No significant cervical adenopathy. Psych:  Alert and cooperative. Normal mood and affect.

## 2010-07-12 NOTE — Progress Notes (Signed)
AGREE

## 2010-07-16 NOTE — Progress Notes (Addendum)
Did we get TCS report/bx reports from Dr Loreta Ave is [facility] yet?  Reviewed 2007 & 2001 TCS/Bx reports-multiple hyperplastic polyps +maternal grandmother w/ hx of colon CA Will discuss w/ Dr Darrick Penna timing next colonoscopy.

## 2010-07-23 ENCOUNTER — Encounter: Payer: Self-pay | Admitting: Family Medicine

## 2010-07-23 DIAGNOSIS — G4700 Insomnia, unspecified: Secondary | ICD-10-CM | POA: Insufficient documentation

## 2010-07-23 DIAGNOSIS — F17200 Nicotine dependence, unspecified, uncomplicated: Secondary | ICD-10-CM | POA: Insufficient documentation

## 2010-07-24 ENCOUNTER — Encounter: Payer: Self-pay | Admitting: Urgent Care

## 2010-08-01 NOTE — Progress Notes (Signed)
Need PR from pt. How's omeprazole working?

## 2010-08-01 NOTE — Progress Notes (Signed)
Called patient, she is doing ok and the  Omeprazole is working.

## 2010-08-03 ENCOUNTER — Emergency Department (HOSPITAL_COMMUNITY): Payer: Medicare HMO

## 2010-08-03 ENCOUNTER — Encounter (HOSPITAL_COMMUNITY): Payer: Self-pay | Admitting: *Deleted

## 2010-08-03 ENCOUNTER — Emergency Department (HOSPITAL_COMMUNITY)
Admission: EM | Admit: 2010-08-03 | Discharge: 2010-08-03 | Disposition: A | Discharge status: Home / Self Care | Payer: Medicare HMO | Attending: Emergency Medicine | Admitting: Emergency Medicine

## 2010-08-03 DIAGNOSIS — F329 Major depressive disorder, single episode, unspecified: Secondary | ICD-10-CM | POA: Insufficient documentation

## 2010-08-03 DIAGNOSIS — Y92009 Unspecified place in unspecified non-institutional (private) residence as the place of occurrence of the external cause: Secondary | ICD-10-CM | POA: Insufficient documentation

## 2010-08-03 DIAGNOSIS — S51809A Unspecified open wound of unspecified forearm, initial encounter: Secondary | ICD-10-CM | POA: Insufficient documentation

## 2010-08-03 DIAGNOSIS — IMO0001 Reserved for inherently not codable concepts without codable children: Secondary | ICD-10-CM | POA: Insufficient documentation

## 2010-08-03 DIAGNOSIS — F3289 Other specified depressive episodes: Secondary | ICD-10-CM | POA: Insufficient documentation

## 2010-08-03 DIAGNOSIS — F17200 Nicotine dependence, unspecified, uncomplicated: Secondary | ICD-10-CM | POA: Insufficient documentation

## 2010-08-03 DIAGNOSIS — G4730 Sleep apnea, unspecified: Secondary | ICD-10-CM | POA: Insufficient documentation

## 2010-08-03 DIAGNOSIS — G43909 Migraine, unspecified, not intractable, without status migrainosus: Secondary | ICD-10-CM | POA: Insufficient documentation

## 2010-08-03 DIAGNOSIS — K219 Gastro-esophageal reflux disease without esophagitis: Secondary | ICD-10-CM | POA: Insufficient documentation

## 2010-08-03 DIAGNOSIS — T148XXA Other injury of unspecified body region, initial encounter: Secondary | ICD-10-CM

## 2010-08-03 MED ORDER — AMOXICILLIN-POT CLAVULANATE 875-125 MG PO TABS
ORAL_TABLET | ORAL | Status: AC
  Filled 2010-08-03: qty 1

## 2010-08-03 MED ORDER — AMOXICILLIN-POT CLAVULANATE 875-125 MG PO TABS: 1.0000 | ORAL_TABLET | Freq: Two times a day (BID) | ORAL | Status: AC

## 2010-08-03 MED ORDER — TETANUS-DIPHTH-ACELL PERTUSSIS 5-2.5-18.5 LF-MCG/0.5 IM SUSP
INTRAMUSCULAR | Status: AC
  Administered 2010-08-03: 0.5 mL via INTRAMUSCULAR
  Filled 2010-08-03: qty 0.5

## 2010-08-03 MED ORDER — HYDROCODONE-ACETAMINOPHEN 5-325 MG PO TABS: ORAL_TABLET | ORAL | Status: DC

## 2010-08-03 MED ORDER — DIPHTHERIA-TETANUS TOXOIDS 6.7-5 LFU/0.5ML IM INJ: 0.5000 mL | INJECTION | Freq: Once | INTRAMUSCULAR | Status: DC

## 2010-08-03 MED ORDER — AMOXICILLIN-POT CLAVULANATE 875-125 MG PO TABS
1.0000 | ORAL_TABLET | Freq: Once | ORAL | Status: AC
  Administered 2010-08-03: 1 via ORAL

## 2010-08-03 MED ORDER — BACITRACIN ZINC 500 UNIT/GM EX OINT
TOPICAL_OINTMENT | CUTANEOUS | Status: AC
  Administered 2010-08-03: 21:00:00
  Filled 2010-08-03: qty 0.9

## 2010-08-03 NOTE — ED Notes (Signed)
Pt states her cat bit and scratched her right forearm. Abrasions and puncture wounds to arm. Bleeding controlled. NAD. States cat is up to date on shots. Pt states deputy came to her home (animal control)

## 2010-08-03 NOTE — ED Notes (Signed)
Tetanus IM given to rt. deltoid

## 2010-08-03 NOTE — ED Provider Notes (Addendum)
History     Chief Complaint  Patient presents with  . Animal Bite   Patient is a 55 y.o. female presenting with animal bite. The history is provided by the patient. No language interpreter was used.  Animal Bite  The incident occurred just prior to arrival (pt states the cat is a pet.  she is not sure whether it has been vaccinated for rabies.   the cat was fighting with another pet cat.  the animal rarely if ever goes outdoors.). The incident occurred at home. She came to the ER via EMS. There is an injury to the right forearm. The pain is moderate. There have been no prior injuries to these areas. She is right-handed. Her tetanus status is unknown. She has been behaving normally. There were no sick contacts. She has received no recent medical care.    Past Medical History  Diagnosis Date  . Depression   . Migraines   . Fibromyalgia   . Sleep apnea     CPAP  . S/P colonoscopy 10/29/1999    Dr Georgia Dom polyps-hyperplastic  . GERD (gastroesophageal reflux disease)   . Hepatitis C 1980    symptomatic w/ jaundice, NEGATIVE HCV RNA  08/2005  . Allergy   . Tobacco dependence   . Insomnia   . S/P colonoscopy 09/12/2005    4 hyperplastic polyps    Past Surgical History  Procedure Date  . Complete hyst 2001  . Sinus surgery with instatrak   . Hip surgery     laproscopic left  . Abdominal hysterectomy     Family History  Problem Relation Age of Onset  . Aneurysm Mother   . Coronary artery disease Father   . Stroke Father   . Ovarian cancer Sister   . Colon cancer Maternal Grandmother     History  Substance Use Topics  . Smoking status: Current Everyday Smoker -- 1.0 packs/day for 40 years    Types: Cigarettes  . Smokeless tobacco: Not on file  . Alcohol Use: No     heavy etoh x 30 yrs, quit 14 yrs ago    OB History    Grav Para Term Preterm Abortions TAB SAB Ect Mult Living                  Review of Systems  Skin: Positive for wound.    Physical Exam  BP  118/67  Pulse 72  Temp(Src) 98.2 F (36.8 C) (Oral)  Resp 20  Ht 5' 8.5" (1.74 m)  Wt 160 lb (72.576 kg)  BMI 23.97 kg/m2  SpO2 97%  Physical Exam  Nursing note and vitals reviewed. Constitutional: She is oriented to person, place, and time. Vital signs are normal. She appears well-developed and well-nourished.  HENT:  Head: Normocephalic and atraumatic.  Right Ear: External ear normal.  Left Ear: External ear normal.  Nose: Nose normal.  Mouth/Throat: No oropharyngeal exudate.  Eyes: Conjunctivae and EOM are normal. Pupils are equal, round, and reactive to light. Right eye exhibits no discharge. Left eye exhibits no discharge. No scleral icterus.  Neck: Normal range of motion. Neck supple. No JVD present. No tracheal deviation present. No thyromegaly present.  Cardiovascular: Normal rate, regular rhythm, normal heart sounds, intact distal pulses and normal pulses.  Exam reveals no gallop and no friction rub.   No murmur heard. Pulmonary/Chest: Effort normal and breath sounds normal. No stridor. No respiratory distress. She has no wheezes. She has no rales. She exhibits no tenderness.  Abdominal: Soft. Normal appearance and bowel sounds are normal. She exhibits no distension and no mass. There is no tenderness. There is no rebound and no guarding.  Musculoskeletal: Normal range of motion. She exhibits no edema and no tenderness.  Lymphadenopathy:    She has no cervical adenopathy.  Neurological: She is alert and oriented to person, place, and time. She has normal reflexes. No cranial nerve deficit. Coordination normal. GCS eye subscore is 4. GCS verbal subscore is 5. GCS motor subscore is 6.  Reflex Scores:      Tricep reflexes are 2+ on the right side and 2+ on the left side.      Bicep reflexes are 2+ on the right side and 2+ on the left side.      Brachioradialis reflexes are 2+ on the right side and 2+ on the left side.      Patellar reflexes are 2+ on the right side and 2+ on  the left side.      Achilles reflexes are 2+ on the right side and 2+ on the left side. Skin: Skin is warm and dry. Abrasion noted. No rash noted. She is not diaphoretic.     Psychiatric: She has a normal mood and affect. Her speech is normal and behavior is normal. Judgment and thought content normal. Cognition and memory are normal.    ED Course  Procedures  MDM The pt initially stated that her mother told her she was allergic to penicillin.  She has taken amoxicillin, augmentin and several of the cephasporins without problem.   Medical screening examination/treatment/procedure(s) were performed by non-physician practitioner and as supervising physician I was immediately available for consultation/collaboration.  Worthy Rancher, PA 08/03/10 2007  Worthy Rancher, PA 08/03/10 2014  Donnetta Hutching, MD 08/22/10 1610  Donnetta Hutching, MD 09/09/10 [phone]

## 2010-08-07 ENCOUNTER — Telehealth: Payer: Self-pay | Admitting: Gastroenterology

## 2010-08-07 NOTE — Telephone Encounter (Signed)
Called pt to discuss need for EGD and TCS. 2 nonworking numbers on profile. LVM at [phone]. Will await call.

## 2010-08-12 ENCOUNTER — Telehealth: Payer: Self-pay | Admitting: Gastroenterology

## 2010-08-12 NOTE — Progress Notes (Signed)
Dr Darrick Penna recommends next colonoscopy 2017. Will arrange EGD w/ [facility].

## 2010-08-12 NOTE — Telephone Encounter (Signed)
Called pt to discuss. LVM.

## 2010-08-13 ENCOUNTER — Telehealth: Payer: Self-pay

## 2010-08-13 ENCOUNTER — Telehealth: Payer: Self-pay | Admitting: Gastroenterology

## 2010-08-13 NOTE — Telephone Encounter (Signed)
Pt is scheduled to see Lorenza Burton, NP on 08/16/2010 in office to set up for EGD in OR.

## 2010-08-13 NOTE — Telephone Encounter (Signed)
Called pt. LVM-call [phone] to discuss next TCS/EGD.

## 2010-08-16 ENCOUNTER — Ambulatory Visit: Payer: Medicare HMO | Admitting: Urgent Care

## 2010-12-03 ENCOUNTER — Other Ambulatory Visit: Payer: Self-pay | Admitting: *Deleted

## 2010-12-03 ENCOUNTER — Encounter: Payer: Self-pay | Admitting: Gynecology

## 2010-12-03 ENCOUNTER — Other Ambulatory Visit (HOSPITAL_COMMUNITY)
Admission: RE | Admit: 2010-12-03 | Discharge: 2010-12-03 | Disposition: A | Discharge status: Home / Self Care | Payer: Medicare HMO | Source: Ambulatory Visit | Attending: Gynecology | Admitting: Gynecology

## 2010-12-03 ENCOUNTER — Telehealth: Payer: Self-pay | Admitting: *Deleted

## 2010-12-03 ENCOUNTER — Ambulatory Visit (INDEPENDENT_AMBULATORY_CARE_PROVIDER_SITE_OTHER): Payer: Medicare HMO | Admitting: Gynecology

## 2010-12-03 DIAGNOSIS — E041 Nontoxic single thyroid nodule: Secondary | ICD-10-CM

## 2010-12-03 DIAGNOSIS — Z01419 Encounter for gynecological examination (general) (routine) without abnormal findings: Secondary | ICD-10-CM

## 2010-12-03 DIAGNOSIS — Z124 Encounter for screening for malignant neoplasm of cervix: Secondary | ICD-10-CM | POA: Insufficient documentation

## 2010-12-03 DIAGNOSIS — F17200 Nicotine dependence, unspecified, uncomplicated: Secondary | ICD-10-CM

## 2010-12-03 DIAGNOSIS — N951 Menopausal and female climacteric states: Secondary | ICD-10-CM

## 2010-12-03 DIAGNOSIS — R634 Abnormal weight loss: Secondary | ICD-10-CM

## 2010-12-03 DIAGNOSIS — Z86010 Personal history of colonic polyps: Secondary | ICD-10-CM

## 2010-12-03 DIAGNOSIS — M810 Age-related osteoporosis without current pathological fracture: Secondary | ICD-10-CM

## 2010-12-03 DIAGNOSIS — Z780 Asymptomatic menopausal state: Secondary | ICD-10-CM

## 2010-12-03 DIAGNOSIS — Z1211 Encounter for screening for malignant neoplasm of colon: Secondary | ICD-10-CM

## 2010-12-03 DIAGNOSIS — B1920 Unspecified viral hepatitis C without hepatic coma: Secondary | ICD-10-CM

## 2010-12-03 DIAGNOSIS — Z79899 Other long term (current) drug therapy: Secondary | ICD-10-CM

## 2010-12-03 DIAGNOSIS — R823 Hemoglobinuria: Secondary | ICD-10-CM

## 2010-12-03 LAB — COMPREHENSIVE METABOLIC PANEL
ALT: <8 U/L (ref 0–35)
Albumin: 3.9 g/dL (ref 3.5–5.2)
CO2: 32 mEq/L (ref 19–32)
Calcium: 9.3 mg/dL (ref 8.4–10.5)
Chloride: 102 mEq/L (ref 96–112)
Creat: 0.63 mg/dL (ref 0.50–1.10)
Potassium: 4.5 mEq/L (ref 3.5–5.3)
Sodium: 140 mEq/L (ref 135–145)
Total Protein: 6.8 g/dL (ref 6.0–8.3)

## 2010-12-03 LAB — T4: T4, Total: 9.7 ug/dL (ref 5.0–12.5)

## 2010-12-03 LAB — POC HEMOCCULT BLD/STL (OFFICE/1-CARD/DIAGNOSTIC): Fecal Occult Blood, POC: NEGATIVE

## 2010-12-03 MED ORDER — ESTRADIOL 1 MG PO TABS: 1.0000 mg | ORAL_TABLET | Freq: Every day | ORAL | Status: DC

## 2010-12-03 NOTE — Progress Notes (Signed)
REGIS WILAND July 13, 1955 [id number]   History:    55 y.o.  for annual exam . Patient has not been seen the office since 2008. Patient with past history of TAH BSO. Had been on Estratest but has not been taking it because she did not have a prescription without coming in for annual exam. She is having vasomotor symptoms. Review of her record indicates that she's a chronic smoker has history of osteoporosis, and hepatitis C. Patient's last chest x-ray was in 2007.  Patient's primary physician is Dr. Vernon Prey at [facility]. Patient is also been followed by Dr. Beryle Beams, who is been following her for sleep apnea. Review of her record also indicated 2007 patient had benign colonic polyps.    Past medical history,surgical history, family history and social history were all reviewed and documented in the EPIC chart.  Gynecologic History No LMP recorded. Patient has had a hysterectomy. Contraception: status post hysterectomy Last Pap:  2008 . Results were: normal Last mammogram:  2008 . Results were: normal  Obstetric History OB History    Grav Para Term Preterm Abortions TAB SAB Ect Mult Living   3 2 2  1  1   2      # Outc Date GA Lbr Len/2nd Wgt Sex Del Anes PTL Lv   1 TRM     F SVD  No Yes   2 TRM     F SVD  No Yes   3 SAB                ROS:  Was performed and pertinent positives and negatives are included in the history.  Exam: chaperone present  BP 136/84  Ht 5' 8.25" (1.734 m)  Wt 158 lb (71.668 kg)  BMI 23.85 kg/m2  Body mass index is 23.85 kg/(m^2).  General appearance : Well developed well nourished female. No acute distress HEENT: Neck supple, trachea midline, no carotid bruits, centrally located 2 cm mobile mass in her thyroid gland Lungs: Clear to auscultation, no rhonchi or wheezes, or rib retractions  Heart: Regular rate and rhythm, no murmurs or gallops Breast:Examined in sitting and supine position were symmetrical in appearance, no palpable  masses or tenderness,  no skin retraction, no nipple inversion, no nipple discharge, no skin discoloration, no axillary or supraclavicular lymphadenopathy Abdomen: no palpable masses or tenderness, no rebound or guarding Extremities: no edema or skin discoloration or tenderness  Pelvic:  Bartholin, Urethra, Skene Glands: Within normal limits             Vagina: No gross lesions or discharge  Cervix:Absent   Uterus  Absent   Adnexa  Without masses or tenderness  Anus and perineum  normal   Rectovaginal  normal sphincter tone without palpated masses or tenderness             Hemoccult  obtained results pending at time of this dictation      Assessment/Plan:  55 y.o. female for annual exam  with a multitude of medical problems please see problem list in epic. Patient will be scheduled to have a mammogram. It has been recommended she followup with her gastroenterologist for her colonoscopy. Due to her history of osteoporosis whereby her AP spine had a T score of -2.5 will reschedule bone density study and to see me shortly thereafter to discuss management. Meanwhile she was instructed to take calcium and vitamin D twice a day. She needs to curtail her smoking habit. She should engage  in weightbearing exercises 45 minutes 3 or 4 times a week. We will schedule for her to have a  Chest x-ray due to her chronic smoking. She was counseled as to the detrimental effects of smoking. During the examination today she was found to have a centrally located thyroid nodule and for this reason we're going to schedule her to have a thyroid scan as well. The following labs will be drawn today: CBC, comprehensive metabolic panel, thyroid function test, urinalysis, cholesterol, urinalysis, and Pap smear.     Ok Edwards MD, 2:57 PM 12/03/2010

## 2010-12-03 NOTE — Telephone Encounter (Signed)
Message copied by Libby Maw on Tue Dec 03, 2010  3:37 PM ------      Message from: Ok Edwards      Created: Tue Dec 03, 2010  3:11 PM       Arland Usery, we need to schedule a thyroid ultrasound on this patient with a 2 cm centrally located mobile mass. Also at the same time she is to schedule a chest x-ray PA and lateral because of her long-standing history of being a chronic smoker one pack per day.

## 2010-12-03 NOTE — Telephone Encounter (Signed)
Patient informed appointment set up with [facility] on 12/11/10 @ 9am.

## 2010-12-09 ENCOUNTER — Other Ambulatory Visit: Payer: Self-pay | Admitting: *Deleted

## 2010-12-09 DIAGNOSIS — R3129 Other microscopic hematuria: Secondary | ICD-10-CM

## 2010-12-11 ENCOUNTER — Ambulatory Visit (HOSPITAL_COMMUNITY)
Admission: RE | Admit: 2010-12-11 | Discharge: 2010-12-11 | Disposition: A | Discharge status: Home / Self Care | Payer: Medicare HMO | Source: Ambulatory Visit | Attending: Gynecology | Admitting: Gynecology

## 2010-12-11 ENCOUNTER — Ambulatory Visit (INDEPENDENT_AMBULATORY_CARE_PROVIDER_SITE_OTHER): Payer: Medicare HMO | Admitting: Gynecology

## 2010-12-11 DIAGNOSIS — E041 Nontoxic single thyroid nodule: Secondary | ICD-10-CM

## 2010-12-11 DIAGNOSIS — R599 Enlarged lymph nodes, unspecified: Secondary | ICD-10-CM | POA: Insufficient documentation

## 2010-12-11 DIAGNOSIS — R3129 Other microscopic hematuria: Secondary | ICD-10-CM

## 2010-12-11 DIAGNOSIS — F17200 Nicotine dependence, unspecified, uncomplicated: Secondary | ICD-10-CM

## 2010-12-11 DIAGNOSIS — E079 Disorder of thyroid, unspecified: Secondary | ICD-10-CM | POA: Insufficient documentation

## 2011-01-16 ENCOUNTER — Ambulatory Visit (INDEPENDENT_AMBULATORY_CARE_PROVIDER_SITE_OTHER): Payer: Medicare HMO

## 2011-01-16 DIAGNOSIS — M810 Age-related osteoporosis without current pathological fracture: Secondary | ICD-10-CM

## 2011-01-23 ENCOUNTER — Ambulatory Visit (INDEPENDENT_AMBULATORY_CARE_PROVIDER_SITE_OTHER): Payer: Medicare HMO | Admitting: Gynecology

## 2011-01-23 ENCOUNTER — Encounter: Payer: Self-pay | Admitting: Gynecology

## 2011-01-23 DIAGNOSIS — M810 Age-related osteoporosis without current pathological fracture: Secondary | ICD-10-CM

## 2011-01-23 DIAGNOSIS — E041 Nontoxic single thyroid nodule: Secondary | ICD-10-CM

## 2011-01-23 NOTE — Progress Notes (Signed)
Patient is a 56 year old who was seen in the office on November 20 for her annual exam. Patient with prior history of TAH BSO. Patient chronic smoker smokes one half pack cigarette per day for over 20 years. Patient with history of osteoporosis and hepatitis C. Patient was having vasomotor symptoms as well. Patient has not been seen the office since 2008. At time of her examination a 2 cm mobile mass was noted in her thyroid gland and she was sent for thyroid ultrasound as well as thyroid function tests results as follows:  Thyroid gland is diffusely inhomogeneous.  Focal nodules: There are multiple bilateral nodules. The total  approximately nine nodules are seen.  RIGHT LOBE:  1. Medial mid pole 1.9 x 1.2 x 1.3 cm mixed solid and cystic  nodule. No calcification is identified.  2. Lateral mid pole 1.8 x 1.1 x 0.9 cm mixed solid and cystic  nodule. No calcification identified.  3. 1.2 x 0.7 x 1.1 cm mixed solid and cystic nodule in the  superior pole.  4. Two sub-centimeter mixed cystic and solid nodules in the mid  right thyroid lobe, one measuring 7 mm in one measuring 6 mm in  greatest size.  LEFT LOBE:  1. Lower pole 2.0 x 1.8 x 1.8 cm mixed solid and cystic nodule.  No calcification identified.  2. Superior pole 1.8 x 1.0 x 1.9 cm mixed cystic and solid nodule.  No calcification identified.  3. 0.8 x 0.6 x 0.7 cm predominately cystic nodule lateral superior  pole.  ISTHMUS: 0.5 x 0.5 x 0.6 predominately cystic nodule in the left  aspect of the isthmus.  Lymphadenopathy: None visualized.  IMPRESSION:  Enlarged thyroid with multiple bilateral thyroid nodules. Findings  suggest goiter. Ultrasound-guided fine needle aspiration should be  considered of the largest nodule(s). The largest nodule in is in  the lower pole of the left lobe (2.0 cm). The second largest  nodule is in the mid pole of the right lobe (1.9 cm).  Patient had normal thyroid function tests as well as comprehensive  metabolic panel.  Patient also recently had a bone density study and was here to discuss both. Her bone density study indicated that her lowest T score was in the AP spine with a value of -2.8 the remainder of the regions of interest where in the osteopenic range. She's had significant bone loss in both the AP spine and left and right hip. We had a lengthy discussion of the severity of her osteoporosis been attributed to her many years of chronic smoking. Her bone mass is 25% below normal and she has an atonic greater risk of spinal fracture her hip T score was indicated she's 20-25% below normal on her bone mass and has a 7 time greater risk of hip fracture. We discussed importance of calcium and vitamin D twice a day as well as weightbearing exercises 45 minutes 3 or 4 times a week and stopping her smoking habit all together. Literature information was provided on anti-smoking techniques. We discussed different treatment options for her osteoporosis I believe that with her T score the AP spine of -2.8 and her chronic smoking that we need to build a bone quickly does Forteo IM daily for 2 years would be recommended. The risks benefits and pros and cons were discussed to include osteosarcoma. We will have her tested for calcium vitamin D and PTH today. She will be referred to Dr. Lurene Shadow endocrinologist whom I have discussed this case about her  thyroid nodules for further evaluation. Consultation 30 minutes.

## 2011-01-23 NOTE — Patient Instructions (Addendum)
Osteoporosis Osteoporosis is a disease of the bones that makes them weaker and prone to break (fracture). By their mid-30s, most people begin to gradually lose bone strength. If this is severe enough, osteoporosis may occur. Osteopenia is a less severe weakness of the bones, which places you at risk for osteoporosis. It is important to identify if you have osteoporosis or osteopenia. Bone fractures from osteoporosis (especially hip and spine fractures) are a major cause of hospitalization, loss of independence, and can lead to life-threatening complications. CAUSES  There are a number of causes and risk factors:  Gender. Women are at a higher risk for osteoporosis than men.   Age. Bone formation slows down with age.   Ethnicity. For unclear reasons, white and Asian women are at higher risk for osteoporosis. Hispanic and African American women are at increased, but lesser, risk.   Family history of osteoporosis can mean that you are at a higher risk for getting it.   History of bone fractures indicates you may be at higher risk of another.   Calcium is very important for bone health and strength. Not enough calcium in your diet increases your risk for osteoporosis. Vitamin D is important for calcium metabolism. You get vitamin D from sunlight, foods, or supplements.   Physical activity. Bones get stronger with weight-bearing exercise and weaker without use.   Smoking is associated with decreased bone strength.   Medicines. Cortisone medicines, too much thyroid medicine, some cancer and seizure medicines, and others can     weaken bones and cause osteoporosis.   Decreased body weight is associated with osteoporosis. The small amount of estrogen-type molecules produced in fat cells seems to protect the bones.   Menopausal decrease in the hormone estrogen can cause osteoporosis.   Low levels of the hormone testosterone can cause osteoporosis.   Some medical conditions can lead to  osteoporosis (hyperthyroidism, hyperparathyroidism, B12 deficiency).  SYMPTOMS  Usually, no symptoms are felt as the bones weaken. The first symptoms are generally related to bone fractures. You may have silent, tiny bone fractures, especially in your spine. This can cause height loss and forward bending of the spine (kyphosis). DIAGNOSIS  You or your caregiver may suspect osteoporosis based on height loss and kyphosis. Osteoporosis or osteopenia may be identified on an X-ray done for other reasons. A bone density measurement will likely be taken. Your bones are often measured at your lower spine or your hips. Measurement is done by an X-ray called a DEXA scan, or sometimes by a computerized X-ray scan (CT or CAT scan). Other tests may be done to find the cause of osteoporosis, such as blood tests to measure calcium and vitamin D, or to monitor treatment. TREATMENT  The goal of osteoporosis treatment is to prevent fractures. This is done through medicine and home care treatments. Treatment will slow the weakening of your bones and strengthen them where possible. Measures to decrease the likelihood of falling and fracturing a bone are also important. Medicine  You may need supplements if you are not getting enough calcium, vitamin D, and vitamin B12.   If you are female and menopausal, you should discuss the option of estrogen replacement or estrogen-like medicine with your caregiver.   Medicines can be taken by mouth or injection to help build bone strength. When taken by mouth, there are important directions that you need to follow.   Calcitonin is a hormone made by the thyroid gland that can help build bone strength and decrease fracture risk  in the spine. It can be taken by nasal spray or injection.   Parathyroid hormone can be injected to help build bone strength.   You will need to continue to get enough calcium intake with any of these medicines.  FALL PREVENTION  If you are unsteady on  your feet, use a cane, walker, or walk with someone's help.   Remove loose rugs or electrical cords from your home.   Keep your home well lit at night. Use glasses if you need them.   Avoid icy streets and wet or waxed floors.   Hold the railing when using stairs.   Watch out for your pets.   Install grab bars in your bathroom.   Exercise. Physical activity, especially weight-bearing exercise, helps strengthen bones. Strength and balance exercise, such as tai chi, helps prevent falls.   Alcohol and some medicines can make you more likely to fall. Discuss alcohol use with your caregiver. Ask your caregiver if any of your medicines might increase your risk for falling. Ask if safer alternatives are available.  HOME CARE INSTRUCTIONS   Try to prevent and avoid falls.   To pick up objects, bend at the knees. Do not bend with your back.   Do not smoke. If you smoke, ask for help to stop.   Have adequate calcium and vitamin D in your diet. Talk with your caregiver about amounts.   Before exercising, ask your caregiver what exercises will be good for you.   Only take over-the-counter or prescription medicines for pain, discomfort, or fever as directed by your caregiver.  SEEK MEDICAL CARE IF:   You have had a fracture and your pain is not controlled.   You have had a fracture and you are not able to return to activities as expected.   You are reinjured.   You develop side effects from medicines, especially stomach pain or trouble swallowing.   You develop new, unexplained problems.  SEEK IMMEDIATE MEDICAL CARE IF:   You develop sudden, severe pain in your back.   You develop pain after an injury or fall.  Document Released: 10/09/2004 Document Revised: 09/11/2010 Document Reviewed: 08/27/2006 [facility] Patient Information 2012 [place], [place].  Smoking Cessation This document explains the best ways for you to quit smoking and new treatments to help. It lists new medicines  that can double or triple your chances of quitting and quitting for good. It also considers ways to avoid relapses and concerns you may have about quitting, including weight gain. NICOTINE: A POWERFUL ADDICTION If you have tried to quit smoking, you know how hard it can be. It is hard because nicotine is a very addictive drug. For some people, it can be as addictive as heroin or cocaine. Usually, people make 2 or 3 tries, or more, before finally being able to quit. Each time you try to quit, you can learn about what helps and what hurts. Quitting takes hard work and a lot of effort, but you can quit smoking. QUITTING SMOKING IS ONE OF THE MOST IMPORTANT THINGS YOU WILL EVER DO.  You will live longer, feel better, and live better.   The impact on your body of quitting smoking is felt almost immediately:   Within 20 minutes, blood pressure decreases. Pulse returns to its normal level.   After 8 hours, carbon monoxide levels in the blood return to normal. Oxygen level increases.   After 24 hours, chance of heart attack starts to decrease. Breath, hair, and body stop smelling  like smoke.   After 48 hours, damaged nerve endings begin to recover. Sense of taste and smell improve.   After 72 hours, the body is virtually free of nicotine. Bronchial tubes relax and breathing becomes easier.   After 2 to 12 weeks, lungs can hold more air. Exercise becomes easier and circulation improves.   Quitting will reduce your risk of having a heart attack, stroke, cancer, or lung disease:   After 1 year, the risk of coronary heart disease is cut in half.   After 5 years, the risk of stroke falls to the same as a nonsmoker.   After 10 years, the risk of lung cancer is cut in half and the risk of other cancers decreases significantly.   After 15 years, the risk of coronary heart disease drops, usually to the level of a nonsmoker.   If you are pregnant, quitting smoking will improve your chances of having a  healthy baby.   The people you live with, especially your children, will be healthier.   You will have extra money to spend on things other than cigarettes.  FIVE KEYS TO QUITTING Studies have shown that these 5 steps will help you quit smoking and quit for good. You have the best chances of quitting if you use them together: 1. Get ready.  2. Get support and encouragement.  3. Learn new skills and behaviors.  4. Get medicine to reduce your nicotine addiction and use it correctly.  5. Be prepared for relapse or difficult situations. Be determined to continue trying to quit, even if you do not succeed at first.  1. GET READY  Set a quit date.   Change your environment.   Get rid of ALL cigarettes, ashtrays, matches, and lighters in your home, car, and place of work.   Do not let people smoke in your home.   Review your past attempts to quit. Think about what worked and what did not.   Once you quit, do not smoke. NOT EVEN A PUFF!  2. GET SUPPORT AND ENCOURAGEMENT Studies have shown that you have a better chance of being successful if you have help. You can get support in many ways.  Tell your family, friends, and coworkers that you are going to quit and need their support. Ask them not to smoke around you.   Talk to your caregivers (doctor, dentist, nurse, pharmacist, psychologist, and/or smoking counselor).   Get individual, group, or telephone counseling and support. The more counseling you have, the better your chances are of quitting. Programs are available at [employer] and health centers. Call your local health department for information about programs in your area.   Spiritual beliefs and practices may help some smokers quit.   Quit meters are Photographer that keep track of quit statistics, such as amount of "quit-time," cigarettes not smoked, and money saved.   Many smokers find one or more of the many self-help books available  useful in helping them quit and stay off tobacco.  3. LEARN NEW SKILLS AND BEHAVIORS  Try to distract yourself from urges to smoke. Talk to someone, go for a walk, or occupy your time with a task.   When you first try to quit, change your routine. Take a different route to work. Drink tea instead of coffee. Eat breakfast in a different place.   Do something to reduce your stress. Take a hot bath, exercise, or read a book.   Plan something enjoyable  to do every day. Reward yourself for not smoking.   Explore interactive web-based programs that specialize in helping you quit.  4. GET MEDICINE AND USE IT CORRECTLY Medicines can help you stop smoking and decrease the urge to smoke. Combining medicine with the above behavioral methods and support can quadruple your chances of successfully quitting smoking. The [place] Food and Drug Administration (FDA) has approved 7 medicines to help you quit smoking. These medicines fall into 3 categories.  Nicotine replacement therapy (delivers nicotine to your body without the negative effects and risks of smoking):   Nicotine gum: Available over-the-counter.   Nicotine lozenges: Available over-the-counter.   Nicotine inhaler: Available by prescription.   Nicotine nasal spray: Available by prescription.   Nicotine skin patches (transdermal): Available by prescription and over-the-counter.   Antidepressant medicine (helps people abstain from smoking, but how this works is unknown):   Bupropion sustained-release (SR) tablets: Available by prescription.   Nicotinic receptor partial agonist (simulates the effect of nicotine in your brain):   Varenicline tartrate tablets: Available by prescription.   Ask your caregiver for advice about which medicines to use and how to use them. Carefully read the information on the package.   Everyone who is trying to quit may benefit from using a medicine. If you are pregnant or trying to become pregnant, nursing an  infant, you are under age 32, or you smoke fewer than 10 cigarettes per day, talk to your caregiver before taking any nicotine replacement medicines.   You should stop using a nicotine replacement product and call your caregiver if you experience nausea, dizziness, weakness, vomiting, fast or irregular heartbeat, mouth problems with the lozenge or gum, or redness or swelling of the skin around the patch that does not go away.   Do not use any other product containing nicotine while using a nicotine replacement product.   Talk to your caregiver before using these products if you have diabetes, heart disease, asthma, stomach ulcers, you had a recent heart attack, you have high blood pressure that is not controlled with medicine, a history of irregular heartbeat, or you have been prescribed medicine to help you quit smoking.  5. BE PREPARED FOR RELAPSE OR DIFFICULT SITUATIONS  Most relapses occur within the first 3 months after quitting. Do not be discouraged if you start smoking again. Remember, most people try several times before they finally quit.   You may have symptoms of withdrawal because your body is used to nicotine. You may crave cigarettes, be irritable, feel very hungry, cough often, get headaches, or have difficulty concentrating.   The withdrawal symptoms are only temporary. They are strongest when you first quit, but they will go away within 10 to 14 days.  Here are some difficult situations to watch for:  Alcohol. Avoid drinking alcohol. Drinking lowers your chances of successfully quitting.   Caffeine. Try to reduce the amount of caffeine you consume. It also lowers your chances of successfully quitting.   Other smokers. Being around smoking can make you want to smoke. Avoid smokers.   Weight gain. Many smokers will gain weight when they quit, usually less than 10 pounds. Eat a healthy diet and stay active. Do not let weight gain distract you from your main goal, quitting  smoking. Some medicines that help you quit smoking may also help delay weight gain. You can always lose the weight gained after you quit.   Bad mood or depression. There are a lot of ways to improve your mood  other than smoking.  If you are having problems with any of these situations, talk to your caregiver. SPECIAL SITUATIONS AND CONDITIONS Studies suggest that everyone can quit smoking. Your situation or condition can give you a special reason to quit.  Pregnant women/new mothers: By quitting, you protect your baby's health and your own.   Hospitalized patients: By quitting, you reduce health problems and help healing.   Heart attack patients: By quitting, you reduce your risk of a second heart attack.   Lung, head, and neck cancer patients: By quitting, you reduce your chance of a second cancer.   Parents of children and adolescents: By quitting, you protect your children from illnesses caused by secondhand smoke.  QUESTIONS TO THINK ABOUT Think about the following questions before you try to stop smoking. You may want to talk about your answers with your caregiver.  Why do you want to quit?   If you tried to quit in the past, what helped and what did not?   What will be the most difficult situations for you after you quit? How will you plan to handle them?   Who can help you through the tough times? Your family? Friends? Caregiver?   What pleasures do you get from smoking? What ways can you still get pleasure if you quit?  Here are some questions to ask your caregiver:  How can you help me to be successful at quitting?   What medicine do you think would be best for me and how should I take it?   What should I do if I need more help?   What is smoking withdrawal like? How can I get information on withdrawal?  Quitting takes hard work and a lot of effort, but you can quit smoking. FOR MORE INFORMATION  Smokefree.gov (http://www.davis-sullivan.com/) provides free, accurate,  evidence-based information and professional assistance to help support the immediate and long-term needs of people trying to quit smoking. Document Released: 12/24/2000 Document Revised: 09/11/2010 Document Reviewed: 10/16/2008 [facility] Patient Information 2012 [place], [place].

## 2011-01-24 LAB — PTH, INTACT AND CALCIUM
Calcium, Total (PTH): 8.5 mg/dL (ref 8.4–10.5)
PTH: 71 pg/mL (ref 14.0–72.0)

## 2011-01-28 ENCOUNTER — Other Ambulatory Visit: Payer: Self-pay | Admitting: *Deleted

## 2011-01-28 DIAGNOSIS — R6889 Other general symptoms and signs: Secondary | ICD-10-CM

## 2011-01-28 MED ORDER — ERGOCALCIFEROL 1.25 MG (50000 UT) PO CAPS: 50000.0000 [IU] | ORAL_CAPSULE | ORAL | Status: AC

## 2011-01-30 ENCOUNTER — Other Ambulatory Visit (HOSPITAL_COMMUNITY): Payer: Self-pay | Admitting: Endocrinology

## 2011-01-30 DIAGNOSIS — E042 Nontoxic multinodular goiter: Secondary | ICD-10-CM

## 2011-02-06 ENCOUNTER — Encounter (HOSPITAL_COMMUNITY): Payer: Self-pay

## 2011-02-06 ENCOUNTER — Encounter (HOSPITAL_COMMUNITY)
Admission: RE | Admit: 2011-02-06 | Discharge: 2011-02-06 | Disposition: A | Discharge status: Home / Self Care | Payer: Medicare HMO | Source: Ambulatory Visit | Attending: Endocrinology | Admitting: Endocrinology

## 2011-02-06 DIAGNOSIS — R9389 Abnormal findings on diagnostic imaging of other specified body structures: Secondary | ICD-10-CM | POA: Insufficient documentation

## 2011-02-06 DIAGNOSIS — E042 Nontoxic multinodular goiter: Secondary | ICD-10-CM | POA: Insufficient documentation

## 2011-02-06 MED ORDER — SODIUM PERTECHNETATE TC 99M INJECTION
10.0000 | Freq: Once | INTRAVENOUS | Status: AC | PRN
  Administered 2011-02-06: 10 via INTRAVENOUS

## 2011-02-17 ENCOUNTER — Telehealth: Payer: Self-pay | Admitting: *Deleted

## 2011-02-17 NOTE — Telephone Encounter (Signed)
Pt called asking about forteo benefits and i did let her know they are in process. I will follow up tomorrow since [facility] not heard back. Pt will be contacted with insurance information.

## 2011-02-19 ENCOUNTER — Other Ambulatory Visit: Payer: Self-pay | Admitting: Endocrinology

## 2011-02-19 DIAGNOSIS — E041 Nontoxic single thyroid nodule: Secondary | ICD-10-CM

## 2011-02-24 ENCOUNTER — Telehealth: Payer: Self-pay | Admitting: *Deleted

## 2011-02-24 MED ORDER — RISEDRONATE SODIUM 150 MG PO TABS: ORAL_TABLET | ORAL | Status: DC

## 2011-02-24 NOTE — Telephone Encounter (Signed)
Pt informed and Rx sent.

## 2011-02-24 NOTE — Telephone Encounter (Signed)
Request for Patricia Jones was denied by her insurance because they require a failed 12 month treatment of alendronate. Please advise

## 2011-02-24 NOTE — Telephone Encounter (Signed)
Patient chronic smoker with evidence of osteoporosis with an AP spine T score of -2.8 insurance did not cover for Forteo. We will start her on Actonel 150 mg one by mouth q. monthly. Patient should be informed that when she takes a tablet it should be first thing in the morning on an empty stomach with a glass of water. She should not drink or eat anything or lay down for 30 minutes after taking the tablet. We'll prescribe one tablet with 11 refills.

## 2011-02-26 ENCOUNTER — Ambulatory Visit
Admission: RE | Admit: 2011-02-26 | Discharge: 2011-02-26 | Disposition: A | Discharge status: Home / Self Care | Payer: Medicare HMO | Source: Ambulatory Visit | Attending: Endocrinology | Admitting: Endocrinology

## 2011-02-26 ENCOUNTER — Other Ambulatory Visit (HOSPITAL_COMMUNITY)
Admission: RE | Admit: 2011-02-26 | Discharge: 2011-02-26 | Disposition: A | Discharge status: Home / Self Care | Payer: Medicare HMO | Source: Ambulatory Visit | Attending: Diagnostic Radiology | Admitting: Diagnostic Radiology

## 2011-02-26 DIAGNOSIS — E041 Nontoxic single thyroid nodule: Secondary | ICD-10-CM

## 2011-02-26 DIAGNOSIS — E049 Nontoxic goiter, unspecified: Secondary | ICD-10-CM | POA: Insufficient documentation

## 2011-07-02 ENCOUNTER — Other Ambulatory Visit (HOSPITAL_COMMUNITY): Payer: Self-pay | Admitting: Urology

## 2011-07-02 DIAGNOSIS — R3129 Other microscopic hematuria: Secondary | ICD-10-CM

## 2011-07-07 ENCOUNTER — Encounter (HOSPITAL_COMMUNITY): Payer: Self-pay

## 2011-07-07 ENCOUNTER — Ambulatory Visit (HOSPITAL_COMMUNITY)
Admission: RE | Admit: 2011-07-07 | Discharge: 2011-07-07 | Disposition: A | Discharge status: Home / Self Care | Payer: Medicare HMO | Source: Ambulatory Visit | Attending: Urology | Admitting: Urology

## 2011-07-07 DIAGNOSIS — Q619 Cystic kidney disease, unspecified: Secondary | ICD-10-CM | POA: Insufficient documentation

## 2011-07-07 DIAGNOSIS — IMO0002 Reserved for concepts with insufficient information to code with codable children: Secondary | ICD-10-CM | POA: Insufficient documentation

## 2011-07-07 DIAGNOSIS — K7689 Other specified diseases of liver: Secondary | ICD-10-CM | POA: Insufficient documentation

## 2011-07-07 DIAGNOSIS — R300 Dysuria: Secondary | ICD-10-CM | POA: Insufficient documentation

## 2011-07-07 DIAGNOSIS — R3129 Other microscopic hematuria: Secondary | ICD-10-CM | POA: Insufficient documentation

## 2011-07-07 MED ORDER — IOHEXOL 300 MG/ML  SOLN
125.0000 mL | Freq: Once | INTRAMUSCULAR | Status: AC | PRN
  Administered 2011-07-07: 125 mL via INTRAVENOUS

## 2011-09-12 ENCOUNTER — Other Ambulatory Visit (HOSPITAL_COMMUNITY): Payer: Self-pay | Admitting: Podiatry

## 2011-09-12 ENCOUNTER — Ambulatory Visit (HOSPITAL_COMMUNITY)
Admission: RE | Admit: 2011-09-12 | Discharge: 2011-09-12 | Disposition: A | Discharge status: Home / Self Care | Payer: Medicare HMO | Source: Ambulatory Visit | Attending: Podiatry | Admitting: Podiatry

## 2011-09-12 DIAGNOSIS — R609 Edema, unspecified: Secondary | ICD-10-CM

## 2011-09-12 DIAGNOSIS — M7989 Other specified soft tissue disorders: Secondary | ICD-10-CM | POA: Insufficient documentation

## 2011-09-12 DIAGNOSIS — M79609 Pain in unspecified limb: Secondary | ICD-10-CM | POA: Insufficient documentation

## 2011-09-12 DIAGNOSIS — R52 Pain, unspecified: Secondary | ICD-10-CM

## 2012-02-20 ENCOUNTER — Ambulatory Visit (HOSPITAL_COMMUNITY)
Admission: RE | Admit: 2012-02-20 | Discharge: 2012-02-20 | Disposition: A | Discharge status: Home / Self Care | Payer: Medicare HMO | Source: Ambulatory Visit | Attending: Physician Assistant | Admitting: Physician Assistant

## 2012-02-20 ENCOUNTER — Other Ambulatory Visit: Payer: Self-pay | Admitting: Physician Assistant

## 2012-02-20 DIAGNOSIS — M545 Low back pain, unspecified: Secondary | ICD-10-CM | POA: Insufficient documentation

## 2012-02-20 DIAGNOSIS — M51379 Other intervertebral disc degeneration, lumbosacral region without mention of lumbar back pain or lower extremity pain: Secondary | ICD-10-CM | POA: Insufficient documentation

## 2012-02-20 DIAGNOSIS — M899 Disorder of bone, unspecified: Secondary | ICD-10-CM | POA: Insufficient documentation

## 2012-02-20 DIAGNOSIS — M25559 Pain in unspecified hip: Secondary | ICD-10-CM | POA: Insufficient documentation

## 2012-02-20 DIAGNOSIS — Z9181 History of falling: Secondary | ICD-10-CM | POA: Insufficient documentation

## 2012-02-20 DIAGNOSIS — W19XXXA Unspecified fall, initial encounter: Secondary | ICD-10-CM

## 2012-02-20 DIAGNOSIS — M5137 Other intervertebral disc degeneration, lumbosacral region: Secondary | ICD-10-CM | POA: Insufficient documentation

## 2012-09-23 ENCOUNTER — Encounter: Payer: Self-pay | Admitting: Physician Assistant

## 2012-09-23 ENCOUNTER — Ambulatory Visit (INDEPENDENT_AMBULATORY_CARE_PROVIDER_SITE_OTHER): Payer: Medicare HMO | Admitting: Physician Assistant

## 2012-09-23 VITALS — BP 162/72 | HR 68 | Temp 98.1°F | Resp 18 | Ht 66.5 in | Wt 164.0 lb

## 2012-09-23 DIAGNOSIS — IMO0002 Reserved for concepts with insufficient information to code with codable children: Secondary | ICD-10-CM

## 2012-09-23 DIAGNOSIS — L570 Actinic keratosis: Secondary | ICD-10-CM

## 2012-09-23 DIAGNOSIS — N369 Urethral disorder, unspecified: Secondary | ICD-10-CM

## 2012-09-23 DIAGNOSIS — F17200 Nicotine dependence, unspecified, uncomplicated: Secondary | ICD-10-CM

## 2012-09-23 DIAGNOSIS — R300 Dysuria: Secondary | ICD-10-CM

## 2012-09-23 DIAGNOSIS — A499 Bacterial infection, unspecified: Secondary | ICD-10-CM

## 2012-09-23 DIAGNOSIS — J988 Other specified respiratory disorders: Secondary | ICD-10-CM

## 2012-09-23 LAB — URINALYSIS, ROUTINE W REFLEX MICROSCOPIC
Glucose, UA: NEGATIVE mg/dL
Leukocytes, UA: NEGATIVE
Nitrite: NEGATIVE
Specific Gravity, Urine: 1.02 (ref 1.005–1.030)
pH: 7 (ref 5.0–8.0)

## 2012-09-23 LAB — URINALYSIS, MICROSCOPIC ONLY
Casts: NONE SEEN
Crystals: NONE SEEN

## 2012-09-23 MED ORDER — AZITHROMYCIN 250 MG PO TABS: ORAL_TABLET | ORAL | Status: DC

## 2012-09-23 NOTE — Progress Notes (Signed)
Patient ID: Patricia Jones MRN: [id number], DOB: 1955-09-20, 57 y.o. Date of Encounter: @DATE @  Chief Complaint:  Chief Complaint  Patient presents with  . c/o bronchitis    all the kids have it  . wants Derm referral    ? skin cancer  . discuss chronic pain    esp back, causing her to have trouble urinating    HPI: 57 y.o. year old female  presents with :  All 3 of her daughters have been sick with similar symptoms. Patient has been sick for 2 weeks. She has had sore throat and cough. She was having a lot more congestion in her nose but this has decreased recently. Ears have felt some pressure but no ear ache. No known fevers or chills.  Also she has developed 2 areas on her skin that she is concerned about. About to the touch and scaly. She notes that they are similar to spots that she has had frozen by the dermatologist in the past and were told to be precancerous.  She also states she is concerned about her bladder. Says in the past she saw urologist and had a cystoscopy. She also had her urethra dilated. She says that she is still having problems with her urination. She has hesitancy. She feels that she needs to go but cannot get the urine to start swallowing for a while. Also she feels that she is not completely emptying her bladder.  She says that she wants to be referred to a different urologist.  She also has noticed her urine looking a little cloudy and dark as if she wants to also check her urine today for infection.  Also she is having some low back pain. She does see a specialist for chronic pain but she wanted to make sure that this is not related to a kidney infection as well.   Past Medical History  Diagnosis Date  . Depression   . Migraines   . Fibromyalgia   . Sleep apnea     CPAP  . S/P colonoscopy 10/29/1999    Dr [place] Dom polyps-hyperplastic  . GERD (gastroesophageal reflux disease)   . Hepatitis C 1980    symptomatic w/ jaundice, NEGATIVE HCV RNA  08/2005   . Allergy   . Tobacco dependence   . Insomnia   . S/P colonoscopy 09/12/2005    4 hyperplastic polyps  . DJD (degenerative joint disease)      Home Meds: See attached medication section for current medication list. Any medications entered into computer today will not appear on this note's list. The medications listed below were entered prior to today. Current Outpatient Prescriptions on File Prior to Visit  Medication Sig Dispense Refill  . butalbital-acetaminophen-caffeine (FIORICET, ESGIC) 50-325-40 MG per tablet       . carisoprodol (SOMA) 350 MG tablet       . diazepam (VALIUM) 5 MG tablet Take 5 mg by mouth as needed (1-2 a day).       Marland Kitchen gabapentin (NEURONTIN) 600 MG tablet       . methadone (DOLOPHINE) 5 MG tablet       . SUMAtriptan (IMITREX) 6 MG/0.5ML SOLN       . amphetamine-dextroamphetamine (ADDERALL) 20 MG tablet Take 20 mg by mouth daily.        Marland Kitchen estradiol (ESTRACE) 1 MG tablet Take 1 tablet (1 mg total) by mouth daily.  30 tablet  11  . FLUoxetine (PROZAC) 40 MG capsule Take 20 mg by  mouth Daily.      Marland Kitchen HYDROcodone-acetaminophen (NORCO) 5-325 MG per tablet One po q 4-6 hrs prn pain  20 tablet  0  . methylPREDNIsolone (MEDROL DOSPACK) 4 MG tablet       . omeprazole (PRILOSEC) 20 MG capsule Take 1 capsule (20 mg total) by mouth daily.  30 capsule  5  . risedronate (ACTONEL) 150 MG tablet with water on empty stomach, nothing by mouth or lie down for next 30 minutes.  1 tablet  11   No current facility-administered medications on file prior to visit.    Allergies:  Allergies  Allergen Reactions  . Penicillins Rash  . Sulfonamide Derivatives Rash    History   Social History  . Marital Status: Divorced    Spouse Name: N/A    Number of Children: 2  . Years of Education: N/A   Occupational History  . disabled      previously RN @ [employer]   Social History Main Topics  . Smoking status: Current Every Day Smoker -- 1.00 packs/day for 40 years    Types: Cigarettes   . Smokeless tobacco: Never Used  . Alcohol Use: No     Comment: heavy etoh x 30 yrs, quit 14 yrs ago  . Drug Use: No     Comment: maijuana use  . Sexual Activity: Yes    Partners: Male     Comment: [facility]   Other Topics Concern  . Not on file   Social History Narrative  . No narrative on file    Family History  Problem Relation Age of Onset  . Aneurysm Mother   . Heart disease Mother   . Coronary artery disease Father   . Stroke Father   . Heart disease Father   . Ovarian cancer Sister   . Colon cancer Maternal Grandmother   . Diabetes Maternal Aunt      Review of Systems:  See HPI for pertinent ROS. All other ROS negative.    Physical Exam: Blood pressure 162/72, pulse 68, temperature 98.1 F (36.7 C), temperature source Oral, resp. rate 18, height 5' 6.5" (1.689 m), weight 164 lb (74.39 kg)., Body mass index is 26.08 kg/(m^2). General: Well-nourished well-developed white female . Appears in no acute distress. Head: Normocephalic, atraumatic, eyes without discharge, sclera non-icteric, nares are without discharge. Bilateral auditory canals clear, TM's are without perforation, pearly grey and translucent with reflective cone of light bilaterally. Oral cavity moist, posterior pharynx without exudate, erythema, peritonsillar abscess, or post nasal drip.  Neck: Supple. No thyromegaly. Anterior cervical lymph nodes are somewhat enlarged and tender.  Lungs: Clear bilaterally to auscultation without wheezes, rales, or rhonchi. Breathing is unlabored. Heart: RRR with S1 S2. No murmurs, rubs, or gallops. Musculoskeletal:  Strength and tone normal for age. Extremities/Skin: On her left medial calf there is a skin lesion that is barely raised with white scaling on top and rough to the touch. On her right medial upper arm there is a similar lesion. This is also proximate 1/2 cm diameter. It is barely raised with some white scale on top and rough to the touch. Neuro: Alert and oriented X  3. Moves all extremities spontaneously. Gait is normal. CNII-XII grossly in tact. Psych:  Responds to questions appropriately with a normal affect.   Results for orders placed in visit on 09/23/12  URINALYSIS, ROUTINE W REFLEX MICROSCOPIC      Result Value Range   Color, Urine YELLOW  YELLOW   APPearance CLEAR  CLEAR   Specific Gravity, Urine 1.020  1.005 - 1.030   pH 7.0  5.0 - 8.0   Glucose, UA NEG  NEG mg/dL   Bilirubin Urine NEG  NEG   Ketones, ur NEG  NEG mg/dL   Hgb urine dipstick SMALL (*) NEG   Protein, ur NEG  NEG mg/dL   Urobilinogen, UA 0.2  0.0 - 1.0 mg/dL   Nitrite NEG  NEG   Leukocytes, UA NEG  NEG  URINALYSIS, MICROSCOPIC ONLY      Result Value Range   Squamous Epithelial / LPF RARE  RARE   Crystals NONE SEEN  NONE SEEN   Casts NONE SEEN  NONE SEEN   WBC, UA 0-2  <3 WBC/hpf   RBC / HPF 0-2  <3 RBC/hpf   Bacteria, UA RARE  RARE     ASSESSMENT AND PLAN:  57 y.o. year old female with  1. Bacterial respiratory infection - azithromycin (ZITHROMAX) 250 MG tablet; Day 1: Take 2 daily Days 2-5: Take 1 daily  Dispense: 6 tablet; Refill: 0  2. Tobacco dependence  3. Actinic keratosis Cryotherapy was applied to both sites -- 4 freeze-thaw cycles to each lesion Return to clinic for future treatment if this does not completely resolve with this therapy today   4. Urethra disorder - Ambulatory referral to Urology  5. Dysuria - Urinalysis, Routine w reflex microscopic   Signed, [address] [place], [place], [facility] 09/23/2012 5:06 PM

## 2012-10-06 ENCOUNTER — Telehealth: Payer: Self-pay | Admitting: Family Medicine

## 2012-10-06 NOTE — Telephone Encounter (Signed)
Returning call from patient left on my voice mail.  She wants to see Rheumatologist for several issues she discussed with Chiropractor.  I told patient she needs to be seen here first and be assessed and worked up here.  Then we have to send notes to specialist before appt can be made.  Patient said she would call back and make appt.

## 2012-10-19 ENCOUNTER — Encounter: Payer: Self-pay | Admitting: Gynecology

## 2012-10-19 ENCOUNTER — Ambulatory Visit (HOSPITAL_COMMUNITY)
Admission: RE | Admit: 2012-10-19 | Discharge: 2012-10-19 | Disposition: A | Discharge status: Home / Self Care | Payer: Medicare HMO | Source: Ambulatory Visit | Attending: Gynecology | Admitting: Gynecology

## 2012-10-19 ENCOUNTER — Ambulatory Visit (INDEPENDENT_AMBULATORY_CARE_PROVIDER_SITE_OTHER): Payer: Medicare HMO | Admitting: Gynecology

## 2012-10-19 VITALS — BP 126/78 | Ht 68.0 in | Wt 166.0 lb

## 2012-10-19 DIAGNOSIS — R29898 Other symptoms and signs involving the musculoskeletal system: Secondary | ICD-10-CM

## 2012-10-19 DIAGNOSIS — F17200 Nicotine dependence, unspecified, uncomplicated: Secondary | ICD-10-CM | POA: Insufficient documentation

## 2012-10-19 DIAGNOSIS — J438 Other emphysema: Secondary | ICD-10-CM | POA: Insufficient documentation

## 2012-10-19 DIAGNOSIS — M6289 Other specified disorders of muscle: Secondary | ICD-10-CM

## 2012-10-19 DIAGNOSIS — Z8639 Personal history of other endocrine, nutritional and metabolic disease: Secondary | ICD-10-CM

## 2012-10-19 DIAGNOSIS — Z23 Encounter for immunization: Secondary | ICD-10-CM

## 2012-10-19 DIAGNOSIS — M810 Age-related osteoporosis without current pathological fracture: Secondary | ICD-10-CM

## 2012-10-19 DIAGNOSIS — Z862 Personal history of diseases of the blood and blood-forming organs and certain disorders involving the immune mechanism: Secondary | ICD-10-CM

## 2012-10-19 MED ORDER — NONFORMULARY OR COMPOUNDED ITEM: Status: DC

## 2012-10-19 NOTE — Patient Instructions (Addendum)
Smoking Cessation Quitting smoking is important to your health and has many advantages. However, it is not always easy to quit since nicotine is a very addictive drug. Often times, people try 3 times or more before being able to quit. This document explains the best ways for you to prepare to quit smoking. Quitting takes hard work and a lot of effort, but you can do it. ADVANTAGES OF QUITTING SMOKING  You will live longer, feel better, and live better.  Your body will feel the impact of quitting smoking almost immediately.  Within 20 minutes, blood pressure decreases. Your pulse returns to its normal level.  After 8 hours, carbon monoxide levels in the blood return to normal. Your oxygen level increases.  After 24 hours, the chance of having a heart attack starts to decrease. Your breath, hair, and body stop smelling like smoke.  After 48 hours, damaged nerve endings begin to recover. Your sense of taste and smell improve.  After 72 hours, the body is virtually free of nicotine. Your bronchial tubes relax and breathing becomes easier.  After 2 to 12 weeks, lungs can hold more air. Exercise becomes easier and circulation improves.  The risk of having a heart attack, stroke, cancer, or lung disease is greatly reduced.  After 1 year, the risk of coronary heart disease is cut in half.  After 5 years, the risk of stroke falls to the same as a nonsmoker.  After 10 years, the risk of lung cancer is cut in half and the risk of other cancers decreases significantly.  After 15 years, the risk of coronary heart disease drops, usually to the level of a nonsmoker.  If you are pregnant, quitting smoking will improve your chances of having a healthy baby.  The people you live with, especially any children, will be healthier.  You will have extra money to spend on things other than cigarettes. QUESTIONS TO THINK ABOUT BEFORE ATTEMPTING TO QUIT You may want to talk about your answers with your  caregiver.  Why do you want to quit?  If you tried to quit in the past, what helped and what did not?  What will be the most difficult situations for you after you quit? How will you plan to handle them?  Who can help you through the tough times? Your family? Friends? A caregiver?  What pleasures do you get from smoking? What ways can you still get pleasure if you quit? Here are some questions to ask your caregiver:  How can you help me to be successful at quitting?  What medicine do you think would be best for me and how should I take it?  What should I do if I need more help?  What is smoking withdrawal like? How can I get information on withdrawal? GET READY  Set a quit date.  Change your environment by getting rid of all cigarettes, ashtrays, matches, and lighters in your home, car, or work. Do not let people smoke in your home.  Review your past attempts to quit. Think about what worked and what did not. GET SUPPORT AND ENCOURAGEMENT You have a better chance of being successful if you have help. You can get support in many ways.  Tell your family, friends, and co-workers that you are going to quit and need their support. Ask them not to smoke around you.  Get individual, group, or telephone counseling and support. Programs are available at local hospitals and health centers. Call your local health department for   information about programs in your area.  Spiritual beliefs and practices may help some smokers quit.  Download a "quit meter" on your computer to keep track of quit statistics, such as how long you have gone without smoking, cigarettes not smoked, and money saved.  Get a self-help book about quitting smoking and staying off of tobacco. LEARN NEW SKILLS AND BEHAVIORS  Distract yourself from urges to smoke. Talk to someone, go for a walk, or occupy your time with a task.  Change your normal routine. Take a different route to work. Drink tea instead of coffee.  Eat breakfast in a different place.  Reduce your stress. Take a hot bath, exercise, or read a book.  Plan something enjoyable to do every day. Reward yourself for not smoking.  Explore interactive web-based programs that specialize in helping you quit. GET MEDICINE AND USE IT CORRECTLY Medicines can help you stop smoking and decrease the urge to smoke. Combining medicine with the above behavioral methods and support can greatly increase your chances of successfully quitting smoking.  Nicotine replacement therapy helps deliver nicotine to your body without the negative effects and risks of smoking. Nicotine replacement therapy includes nicotine gum, lozenges, inhalers, nasal sprays, and skin patches. Some may be available over-the-counter and others require a prescription.  Antidepressant medicine helps people abstain from smoking, but how this works is unknown. This medicine is available by prescription.  Nicotinic receptor partial agonist medicine simulates the effect of nicotine in your brain. This medicine is available by prescription. Ask your caregiver for advice about which medicines to use and how to use them based on your health history. Your caregiver will tell you what side effects to look out for if you choose to be on a medicine or therapy. Carefully read the information on the package. Do not use any other product containing nicotine while using a nicotine replacement product.  RELAPSE OR DIFFICULT SITUATIONS Most relapses occur within the first 3 months after quitting. Do not be discouraged if you start smoking again. Remember, most people try several times before finally quitting. You may have symptoms of withdrawal because your body is used to nicotine. You may crave cigarettes, be irritable, feel very hungry, cough often, get headaches, or have difficulty concentrating. The withdrawal symptoms are only temporary. They are strongest when you first quit, but they will go away within  10 14 days. To reduce the chances of relapse, try to:  Avoid drinking alcohol. Drinking lowers your chances of successfully quitting.  Reduce the amount of caffeine you consume. Once you quit smoking, the amount of caffeine in your body increases and can give you symptoms, such as a rapid heartbeat, sweating, and anxiety.  Avoid smokers because they can make you want to smoke.  Do not let weight gain distract you. Many smokers will gain weight when they quit, usually less than 10 pounds. Eat a healthy diet and stay active. You can always lose the weight gained after you quit.  Find ways to improve your mood other than smoking. FOR MORE INFORMATION  www.smokefree.gov  Document Released: 12/24/2000 Document Revised: 07/01/2011 Document Reviewed: 04/10/2011 [facility] Patient Information 2014 [place], [place]. Influenza Vaccine (Flu Vaccine, Inactivated) 2013 2014 What You Need to Know WHY GET VACCINATED?  Influenza ("flu") is a contagious disease that spreads around the [place] every winter, usually between October and May.  Flu is caused by the influenza virus, and can be spread by coughing, sneezing, and close contact.  Anyone can get flu,  but the risk of getting flu is highest among children. Symptoms come on suddenly and may last several days. They can include:  Fever or chills.  Sore throat.  Muscle aches.  Fatigue.  Cough.  Headache.  Runny or stuffy nose. Flu can make some people much sicker than others. These people include young children, people 105 and older, pregnant women, and people with certain health conditions such as heart, lung or kidney disease, or a weakened immune system. Flu vaccine is especially important for these people, and anyone in close contact with them. Flu can also lead to pneumonia, and make existing medical conditions worse. It can cause diarrhea and seizures in children. Each year thousands of people in the [place] die from flu, and  many more are hospitalized. Flu vaccine is the best protection we have from flu and its complications. Flu vaccine also helps prevent spreading flu from person to person. INACTIVATED FLU VACCINE There are 2 types of influenza vaccine:  You are getting an inactivated flu vaccine, which does not contain any live influenza virus. It is given by injection with a needle, and often called the "flu shot."  A different live, attenuated (weakened) influenza vaccine is sprayed into the nostrils. This vaccine is described in a separate Vaccine Information Statement. Flu vaccine is recommended every year. Children 6 months through 45 years of age should get 2 doses the first year they get vaccinated. Flu viruses are always changing. Each year's flu vaccine is made to protect from viruses that are most likely to cause disease that year. While flu vaccine cannot prevent all cases of flu, it is our best defense against the disease. Inactivated flu vaccine protects against 3 or 4 different influenza viruses. It takes about 2 weeks for protection to develop after the vaccination, and protection lasts several months to a year. Some illnesses that are not caused by influenza virus are often mistaken for flu. Flu vaccine will not prevent these illnesses. It can only prevent influenza. A "high-dose" flu vaccine is available for people 50 years of age and older. The person giving you the vaccine can tell you more about it. Some inactivated flu vaccine contains a very small amount of a mercury-based preservative called thimerosal. Studies have shown that thimerosal in vaccines is not harmful, but flu vaccines that do not contain a preservative are available. SOME PEOPLE SHOULD NOT GET THIS VACCINE Tell the person who gives you the vaccine:  If you have any severe (life-threatening) allergies. If you ever had a life-threatening allergic reaction after a dose of flu vaccine, or have a severe allergy to any part of this  vaccine, you may be advised not to get a dose. Most, but not all, types of flu vaccine contain a small amount of egg.  If you ever had Guillain Barr Syndrome (a severe paralyzing illness, also called GBS). Some people with a history of GBS should not get this vaccine. This should be discussed with your doctor.  If you are not feeling well. They might suggest waiting until you feel better. But you should come back. RISKS OF A VACCINE REACTION With a vaccine, like any medicine, there is a chance of side effects. These are usually mild and go away on their own. Serious side effects are also possible, but are very rare. Inactivated flu vaccine does not contain live flu virus, sogetting flu from this vaccine is not possible. Brief fainting spells and related symptoms (such as jerking movements) can happen after any  medical procedure, including vaccination. Sitting or lying down for about 15 minutes after a vaccination can help prevent fainting and injuries caused by falls. Tell your doctor if you feel dizzy or lightheaded, or have vision changes or ringing in the ears. Mild problems following inactivated flu vaccine:  Soreness, redness, or swelling where the shot was given.  Hoarseness; sore, red or itchy eyes; or cough.  Fever.  Aches.  Headache.  Itching.  Fatigue. If these problems occur, they usually begin soon after the shot and last 1 or 2 days. Moderate problems following inactivated flu vaccine:  Young children who get inactivated flu vaccine and pneumococcal vaccine (PCV13) at the same time may be at increased risk for seizures caused by fever. Ask your doctor for more information. Tell your doctor if a child who is getting flu vaccine has ever had a seizure. Severe problems following inactivated flu vaccine:  A severe allergic reaction could occur after any vaccine (estimated less than 1 in a million doses).  There is a small possibility that inactivated flu vaccine could be  associated with Guillan Barr Syndrome (GBS), no more than 1 or 2 cases per million people vaccinated. This is much lower than the risk of severe complications from flu, which can be prevented by flu vaccine. The safety of vaccines is always being monitored. For more information, visit: http://floyd.org/ WHAT IF THERE IS A SERIOUS REACTION? What should I look for?  Look for anything that concerns you, such as signs of a severe allergic reaction, very high fever, or behavior changes. Signs of a severe allergic reaction can include hives, swelling of the face and throat, difficulty breathing, a fast heartbeat, dizziness, and weakness. These would start a few minutes to a few hours after the vaccination. What should I do?  If you think it is a severe allergic reaction or other emergency that cannot wait, call [phone] 1 1  or get the person to the nearest hospital. Otherwise, call your doctor.  Afterward, the reaction should be reported to the Vaccine Adverse Event Reporting System (VAERS). Your doctor might file this report, or you can do it yourself through the VAERS website at www.vaers.LAgents.no, or by calling 1-3255925653. VAERS is only for reporting reactions. They do not give medical advice. THE NATIONAL VACCINE INJURY COMPENSATION PROGRAM The National Vaccine Injury Compensation Program (VICP) is a federal program that was created to compensate people who may have been injured by certain vaccines. Persons who believe they may have been injured by a vaccine can learn about the program and about filing a claim by calling 1-239-325-8819 or visiting the VICP website at SpiritualWord.at HOW CAN I LEARN MORE?  Ask your doctor.  Call your local or state health department.  Contact the Centers for Disease Control and Prevention (CDC):  Call (725) 855-1254 (1-800-CDC-INFO) or  Visit CDC's website at BiotechRoom.com.cy CDC Inactivated Influenza Vaccine Interim VIS  (08/08/11) Document Released: 10/24/2005 Document Revised: 09/24/2011 Document Reviewed: 08/08/2011 [facility] Patient Information 2014 [place], [place]. Osteoporosis Throughout your life, your body breaks down old bone and replaces it with new bone. As you get older, your body does not replace bone as quickly as it breaks it down. By the age of 30 years, most people begin to gradually lose bone because of the imbalance between bone loss and replacement. Some people lose more bone than others. Bone loss beyond a specified normal degree is considered osteoporosis.  Osteoporosis affects the strength and durability of your bones. The inside of the ends of your  bones and your flat bones, like the bones of your pelvis, look like honeycomb, filled with tiny open spaces. As bone loss occurs, your bones become less dense. This means that the open spaces inside your bones become bigger and the walls between these spaces become thinner. This makes your bones weaker. Bones of a person with osteoporosis can become so weak that they can break (fracture) during minor accidents, such as a simple fall. CAUSES  The following factors have been associated with the development of osteoporosis:  Smoking.  Drinking more than 2 alcoholic drinks several days per week.  Long-term use of certain medicines:  Corticosteroids.  Chemotherapy medicines.  Thyroid medicines.  Antiepileptic medicines.  Gonadal hormone suppression medicine.  Immunosuppression medicine.  Being underweight.  Lack of physical activity.  Lack of exposure to the sun. This can lead to vitamin D deficiency.  Certain medical conditions:  Certain inflammatory bowel diseases, such as Crohn's disease and ulcerative colitis.  Diabetes.  Hyperthyroidism.  Hyperparathyroidism. RISK FACTORS Anyone can develop osteoporosis. However, the following factors can increase your risk of developing osteoporosis:  Gender Women are at higher risk than  men.  Age Being older than 50 years increases your risk.  Ethnicity White and Asian people have an increased risk.  Weight Being extremely underweight can increase your risk of osteoporosis.  Family history of osteoporosis Having a family member who has developed osteoporosis can increase your risk. SYMPTOMS  Usually, people with osteoporosis have no symptoms.  DIAGNOSIS  Signs during a physical exam that may prompt your caregiver to suspect osteoporosis include:  Decreased height. This is usually caused by the compression of the bones that form your spine (vertebrae) because they have weakened and become fractured.  A curving or rounding of the upper back (kyphosis). To confirm signs of osteoporosis, your caregiver may request a procedure that uses 2 low-dose X-ray beams with different levels of energy to measure your bone mineral density (dual-energy X-ray absorptiometry [DXA]). Also, your caregiver may check your level of vitamin D. TREATMENT  The goal of osteoporosis treatment is to strengthen bones in order to decrease the risk of bone fractures. There are different types of medicines available to help achieve this goal. Some of these medicines work by slowing the processes of bone loss. Some medicines work by increasing bone density. Treatment also involves making sure that your levels of calcium and vitamin D are adequate. PREVENTION  There are things you can do to help prevent osteoporosis. Adequate intake of calcium and vitamin D can help you achieve optimal bone mineral density. Regular exercise can also help, especially resistance and weight-bearing activities. If you smoke, quitting smoking is an important part of osteoporosis prevention. MAKE SURE YOU:  Understand these instructions.  Will watch your condition.  Will get help right away if you are not doing well or get worse. Document Released: 10/09/2004 Document Revised: 12/17/2011 Document Reviewed: 12/14/2010 [facility]  Patient Information 2014 [place], [place]. Teriparatide injection What is this medicine? TERIPARATIDE (terr ih PAR a tyd) increases bone mass and strength. It helps make healthy bone and to slow bone loss. This medicine is used to prevent bone fractures. This medicine may be used for other purposes; ask your health care provider or pharmacist if you have questions. What should I tell my health care provider before I take this medicine? They need to know if you have any of these conditions: -bone disease other than osteoporosis -heart, kidney or liver disease -history of cancer in the bone -  kidney stone -Paget's disease -parathyroid disease -receiving radiation therapy -an unusual or allergic reaction to teriparatide, other medicines, foods, dyes, or preservatives -pregnant or trying to get pregnant -breast-feeding How should I use this medicine? This medicine comes in an injection pen device. This pen injects the medicine under your skin. Follow the directions on the prescription label. You will be taught how to use this medicine. Take your medicine at regular intervals. Do not take your medicine more often than directed. Do not use this medicine if it has solid particles in it, or if it is cloudy or colored. It should be clear and colorless. Be sure that you are using your pen device correctly.  A special MedGuide will be given to you by the pharmacist with each prescription and refill. Be sure to read this information carefully each time. Talk to your pediatrician regarding the use of this medicine in children. Special care may be needed. Overdosage: If you think you have taken too much of this medicine contact a poison control center or emergency room at once. NOTE: This medicine is only for you. Do not share this medicine with others. What if I miss a dose? If you miss a dose, take it as soon as you can. If it is almost time for your next dose, take only that dose. Do not take double or  extra doses. What may interact with this medicine? -digoxin -other medicines to strengthen bone This list may not describe all possible interactions. Give your health care provider a list of all the medicines, herbs, non-prescription drugs, or dietary supplements you use. Also tell them if you smoke, drink alcohol, or use illegal drugs. Some items may interact with your medicine. What should I watch for while using this medicine? Visit your doctor or health care professional for regular checks on your progress. Your doctor may order blood tests and other tests to see how you are doing. You should make sure you get enough calcium and vitamin D while you are taking this medicine, unless your doctor tells you not to. Discuss the foods you eat and the vitamins you take with your health care professional. Bonita Quin may get drowsy or dizzy. Do not drive, use machinery, or do anything that needs mental alertness until you know how this medicine affects you. Do not stand or sit up quickly, especially if you are an older patient. This reduces the risk of dizzy or fainting spells. What side effects may I notice from receiving this medicine? Side effects that you should report to your doctor or health care professional as soon as possible: -allergic reactions like skin rash, itching or hives, swelling of the face, lips, or tongue -chronic constipation -high blood pressure -irregular heartbeat, chest pain -nausea, vomiting -unusually weak or tired Side effects that usually do not require medical attention (report to your doctor or health care professional if they continue or are bothersome): -bone pain -cough, runny nose -headache -leg cramps -muscle spasms in the back or legs -pain, redness, irritation or swelling at the injection site -stomach upset -trouble sleeping This list may not describe all possible side effects. Call your doctor for medical advice about side effects. You may report side effects to  FDA at 1-800-FDA-1088. Where should I keep my medicine? Keep out of the reach of children. Store in a refrigerator between 2 and 8 degrees C (36 and 46 degrees F). Do not freeze. Recap the pen injector when not in use to protect it from light and damage.  Use quickly after taking out of the refrigerator and return to refrigerator right after using. Throw away any unused medicine 28 days after the first injection from the device. Do not use after the expiration date printed on the pen and pen packaging. NOTE: This sheet is a summary. It may not cover all possible information. If you have questions about this medicine, talk to your doctor, pharmacist, or health care provider.  2013, [employer]. (12/28/2007 5:45:37 PM)

## 2012-10-19 NOTE — Progress Notes (Signed)
Patricia Jones 10-21-1955 [id number]   History:    57 y.o.  for annual gyn exam who is not been seen in the office for annual exam since November 2012. Prior to that she had not been in the office since 2008. Review of her records indicated that past history of TAH BSO and was on Estratest but is no longer on any hormone. She is complaining of vaginal dryness and irritation. Vasomotor symptoms are minimal. She is a chronic smoker of 1-1-1/2 packs per day and has history of osteoporosis and hepatitis C. Last chest x-ray was in 2007.Patient's primary physician is Dr. Vernon Prey at [facility]. Patient is also been followed by Dr. Beryle Beams, who is been following her for sleep apnea. Review of her record also indicated 2007 patient had benign colonic polyps.   In 2012 during physical exam she was found to have a centrally located thyroid nodule and was sent for a thyroid ultrasound with the following findings: Thyroid gland is diffusely inhomogeneous.  Focal nodules: There are multiple bilateral nodules. The total  approximately nine nodules are seen.  RIGHT LOBE:  1. Medial mid pole 1.9 x 1.2 x 1.3 cm mixed solid and cystic  nodule. No calcification is identified.  2. Lateral mid pole 1.8 x 1.1 x 0.9 cm mixed solid and cystic  nodule. No calcification identified.  3. 1.2 x 0.7 x 1.1 cm mixed solid and cystic nodule in the  superior pole.  4. Two sub-centimeter mixed cystic and solid nodules in the mid  right thyroid lobe, one measuring 7 mm in one measuring 6 mm in  greatest size.  LEFT LOBE:  1. Lower pole 2.0 x 1.8 x 1.8 cm mixed solid and cystic nodule.  No calcification identified.  2. Superior pole 1.8 x 1.0 x 1.9 cm mixed cystic and solid nodule.  No calcification identified.  3. 0.8 x 0.6 x 0.7 cm predominately cystic nodule lateral superior  pole.  ISTHMUS: 0.5 x 0.5 x 0.6 predominately cystic nodule in the left  aspect of the isthmus.  Lymphadenopathy: None  visualized.  IMPRESSION:  Enlarged thyroid with multiple bilateral thyroid nodules. Findings  suggest goiter. Ultrasound-guided fine needle aspiration should be  considered of the largest nodule(s). The largest nodule in is in  the lower pole of the left lobe (2.0 cm). The second largest  nodule is in the mid pole of the right lobe (1.9 cm).  Patient had normal thyroid function tests   She was referred to the endocrinologist Dr. Lurene Shadow who had ordered a nuclear medicine thyroid scan and the following was noted:  IMPRESSION:  Dominant cold nodule inferior pole left lobe.  This corresponds to a 2 cm nodule on prior ultrasound.  The 1.9 cm diameter mid right lobe nodule seen on the prior  ultrasound is not identified as a cold nodule on this exam,  question functional adenoma.  Questionable subtle area of diminished tracer at the lateral aspect  of the mid left lobe, and an area questionably corresponding to a  1.9 x 1.1 x 1.8 cm.  Tissue diagnosis of the cold nodule(s) is recommended to exclude  Malignancy.  There appears to be a compliance tissue patient currently on no medication and has not see the endocrinologist again.  Patient denies any prior history of abnormal Pap smear.  Because of her osteoporosis she was prescribed Forteo  but never picked up the medication because it was too cautiously. We then prescribed her Actonel 150  mg monthly she did not take it because she couldn't afford it as well. Patient states she now has Medicaid and Medicare. Patient did complain of muscle tiredness and fatigue.   Past medical history,surgical history, family history and social history were all reviewed and documented in the EPIC chart.  Gynecologic History No LMP recorded. Patient has had a hysterectomy. Contraception: status post hysterectomy Last Pap: 2012. Results were: normal Last mammogram: 2008. Results were: normal  Obstetric History OB History  Gravida Para Term Preterm AB SAB  TAB Ectopic Multiple Living  3 2 2  1 1    2     # Outcome Date GA Lbr Len/2nd Weight Sex Delivery Anes PTL Lv  3 SAB           2 TRM     F SVD  N Y  1 TRM     F SVD  N Y       ROS: A ROS was performed and pertinent positives and negatives are included in the history.  GENERAL: No fevers or chills. HEENT: No change in vision, no earache, sore throat or sinus congestion. NECK: No pain or stiffness. CARDIOVASCULAR: No chest pain or pressure. No palpitations. PULMONARY: No shortness of breath, cough or wheeze. GASTROINTESTINAL: No abdominal pain, nausea, vomiting or diarrhea, melena or bright red blood per rectum. GENITOURINARY: No urinary frequency, urgency, hesitancy or dysuria. MUSCULOSKELETAL: No joint or muscle pain, no back pain, no recent trauma. DERMATOLOGIC: No rash, no itching, no lesions. ENDOCRINE: No polyuria, polydipsia, no heat or cold intolerance. No recent change in weight. HEMATOLOGICAL: No anemia or easy bruising or bleeding. NEUROLOGIC: No headache, seizures, numbness, tingling or weakness. PSYCHIATRIC: No depression, no loss of interest in normal activity or change in sleep pattern.     Exam: chaperone present  BP 126/78  Ht 5\' 8"  (1.727 m)  Wt 166 lb (75.297 kg)  BMI 25.25 kg/m2  Body mass index is 25.25 kg/(m^2).  General appearance : Well developed well nourished female. No acute distress HEENT: Neck supple, trachea midline, no carotid bruits, no thyroidmegaly Lungs: Clear to auscultation, no rhonchi or wheezes, or rib retractions  Heart: Regular rate and rhythm, no murmurs or gallops Breast:Examined in sitting and supine position were symmetrical in appearance, no palpable masses or tenderness,  no skin retraction, no nipple inversion, no nipple discharge, no skin discoloration, no axillary or supraclavicular lymphadenopathy Abdomen: no palpable masses or tenderness, no rebound or guarding Extremities: no edema or skin discoloration or  tenderness  Pelvic:  Bartholin, Urethra, Skene Glands: Within normal limits             Vagina: No gross lesions or discharge  Cervix:absence  Uterus  absent  Adnexa  Without masses or tenderness  Anus and perineum  normal   Rectovaginal  normal sphincter tone without palpated masses or tenderness             Hemoccult card provided     Assessment/Plan:  57 y.o. female for annual exam with history of osteoporosis, change smoker, menopausal who has been poor compliance on her health and recommendations. She states that she would like to follow through now with recommendations. We outlined the following in written format and was handed to her initiated paper:  #1. Patient to schedule her overdue mammogram #2 patient to schedule followup colonoscopy Which is over due to her history of colon polyps #3 patient to schedule a bone density study here in the office #[facility] PA and lateral because of her chronic smoking #5 to followup with her endocrinologist  We will make arrangements once again to see if she can be started on the Forteo because her last bone density study indicated her AP spine had a value -2.8 and the other risk factor of her being thin, Caucasian, smoking up to 2 packs a day. We will check a thyroid panel today PTH and calcium, and vitamin D level. Pap smear was not done today in accordance with the new guidelines.  She received the flu vaccine today.    Note: This dictation was prepared with  Dragon/digital dictation along withSmart phrase technology. Any transcriptional errors that result from this process are unintentional.   Ok Edwards MD, 5:43 PM 10/19/2012

## 2012-10-20 ENCOUNTER — Other Ambulatory Visit: Payer: Self-pay | Admitting: Gynecology

## 2012-10-20 DIAGNOSIS — E559 Vitamin D deficiency, unspecified: Secondary | ICD-10-CM

## 2012-10-20 LAB — THYROID PANEL WITH TSH
Free Thyroxine Index: 3.1 (ref 1.0–3.9)
T3 Uptake: 36.3 % (ref 22.5–37.0)
T4, Total: 8.5 ug/dL (ref 5.0–12.5)

## 2012-10-20 MED ORDER — ERGOCALCIFEROL 1.25 MG (50000 UT) PO CAPS: 50000.0000 [IU] | ORAL_CAPSULE | ORAL | Status: DC

## 2012-10-21 LAB — PTH, INTACT AND CALCIUM
Calcium: 9.2 mg/dL (ref 8.4–10.5)
PTH: 62.9 pg/mL (ref 14.0–72.0)

## 2012-10-25 ENCOUNTER — Other Ambulatory Visit: Payer: Self-pay

## 2012-10-25 DIAGNOSIS — Z1231 Encounter for screening mammogram for malignant neoplasm of breast: Secondary | ICD-10-CM

## 2012-10-26 ENCOUNTER — Encounter (INDEPENDENT_AMBULATORY_CARE_PROVIDER_SITE_OTHER): Payer: Self-pay

## 2012-10-26 ENCOUNTER — Ambulatory Visit (INDEPENDENT_AMBULATORY_CARE_PROVIDER_SITE_OTHER): Payer: Medicare HMO | Admitting: Urology

## 2012-10-26 DIAGNOSIS — R3989 Other symptoms and signs involving the genitourinary system: Secondary | ICD-10-CM

## 2012-11-03 ENCOUNTER — Encounter: Payer: Self-pay | Admitting: Gynecology

## 2012-11-09 ENCOUNTER — Encounter: Payer: Self-pay | Admitting: *Deleted

## 2012-11-10 ENCOUNTER — Ambulatory Visit: Payer: Medicare HMO | Admitting: Gastroenterology

## 2012-11-11 ENCOUNTER — Ambulatory Visit: Payer: Medicare HMO

## 2012-11-17 ENCOUNTER — Other Ambulatory Visit: Payer: Medicare HMO | Admitting: Anesthesiology

## 2012-11-17 ENCOUNTER — Other Ambulatory Visit (HOSPITAL_COMMUNITY): Payer: Self-pay | Admitting: Endocrinology

## 2012-11-17 DIAGNOSIS — E049 Nontoxic goiter, unspecified: Secondary | ICD-10-CM

## 2012-11-17 DIAGNOSIS — Z1211 Encounter for screening for malignant neoplasm of colon: Secondary | ICD-10-CM

## 2012-11-17 DIAGNOSIS — Z1212 Encounter for screening for malignant neoplasm of rectum: Secondary | ICD-10-CM

## 2012-11-19 ENCOUNTER — Ambulatory Visit (HOSPITAL_COMMUNITY)
Admission: RE | Admit: 2012-11-19 | Discharge: 2012-11-19 | Disposition: A | Discharge status: Home / Self Care | Payer: Medicare HMO | Source: Ambulatory Visit | Attending: Endocrinology | Admitting: Endocrinology

## 2012-11-19 DIAGNOSIS — E042 Nontoxic multinodular goiter: Secondary | ICD-10-CM | POA: Insufficient documentation

## 2012-11-19 DIAGNOSIS — E049 Nontoxic goiter, unspecified: Secondary | ICD-10-CM

## 2012-11-29 ENCOUNTER — Ambulatory Visit: Payer: Medicare HMO | Admitting: Gastroenterology

## 2012-11-30 ENCOUNTER — Telehealth: Payer: Self-pay | Admitting: *Deleted

## 2012-11-30 NOTE — Telephone Encounter (Signed)
After Starting benefits request on 10/20/12 and having to get a Prior Authorization which was denied then an appeal was done which was approved and then [facility] pharmacy which distributes Forteo gave [place] a copay of $275 a month. The patient could not afford that therefore patient assistance was estalished. The Patient received assistance through Patient [employer], has enough grant to get through this year and in January 2015, it will be reinvestigated to get her a grant for 2015 costs. I spoke to [employer] [facility] pharmacy today the pts shipment will go out today and I spoke with the patient and she will call me to set up training. Marland Kitchen KW CMA

## 2012-12-06 ENCOUNTER — Ambulatory Visit: Payer: Medicare HMO

## 2012-12-08 ENCOUNTER — Encounter: Payer: Self-pay | Admitting: Family Medicine

## 2012-12-08 DIAGNOSIS — G43719 Chronic migraine without aura, intractable, without status migrainosus: Secondary | ICD-10-CM

## 2012-12-08 DIAGNOSIS — M545 Low back pain, unspecified: Secondary | ICD-10-CM

## 2012-12-08 DIAGNOSIS — IMO0001 Reserved for inherently not codable concepts without codable children: Secondary | ICD-10-CM | POA: Insufficient documentation

## 2012-12-08 HISTORY — DX: Chronic migraine without aura, intractable, without status migrainosus: G43.719

## 2012-12-08 HISTORY — DX: Low back pain, unspecified: M54.50

## 2012-12-08 HISTORY — DX: Reserved for inherently not codable concepts without codable children: IMO0001

## 2012-12-16 ENCOUNTER — Ambulatory Visit: Payer: Medicare HMO | Admitting: Gastroenterology

## 2012-12-27 ENCOUNTER — Telehealth: Payer: Self-pay | Admitting: Physician Assistant

## 2012-12-27 NOTE — Telephone Encounter (Signed)
Pt wants to know if she can have a referral to someone who specializes in diabetes because she has recently found out she is a diabetic Call back number is [phone]

## 2012-12-29 NOTE — Telephone Encounter (Signed)
Tell her that we could manage this here. No need for a referral.

## 2012-12-29 NOTE — Telephone Encounter (Signed)
Wants referral to diabetic place stated that when she went to see Endocrinologist last year they told her she was diabetic they ran a Hgb A1C on her and it was 6.5%

## 2013-01-17 ENCOUNTER — Encounter: Payer: Self-pay | Admitting: Family Medicine

## 2013-01-17 NOTE — Telephone Encounter (Signed)
Have not been able to reach patient.  Sent letter to make appt here.

## 2013-01-19 ENCOUNTER — Ambulatory Visit: Payer: Medicare HMO | Admitting: Gastroenterology

## 2013-02-14 ENCOUNTER — Encounter: Payer: Self-pay | Admitting: Physician Assistant

## 2013-02-14 ENCOUNTER — Ambulatory Visit (INDEPENDENT_AMBULATORY_CARE_PROVIDER_SITE_OTHER): Payer: Medicare HMO | Admitting: Physician Assistant

## 2013-02-14 VITALS — BP 96/64 | HR 68 | Temp 98.1°F | Resp 18 | Ht 67.75 in | Wt 165.0 lb

## 2013-02-14 DIAGNOSIS — R739 Hyperglycemia, unspecified: Secondary | ICD-10-CM | POA: Insufficient documentation

## 2013-02-14 DIAGNOSIS — E785 Hyperlipidemia, unspecified: Secondary | ICD-10-CM

## 2013-02-14 DIAGNOSIS — R7309 Other abnormal glucose: Secondary | ICD-10-CM

## 2013-02-14 LAB — COMPLETE METABOLIC PANEL WITH GFR
ALT: 12 U/L (ref 0–35)
AST: 14 U/L (ref 0–37)
Albumin: 4 g/dL (ref 3.5–5.2)
Alkaline Phosphatase: 68 U/L (ref 39–117)
BUN: 11 mg/dL (ref 6–23)
CALCIUM: 9.5 mg/dL (ref 8.4–10.5)
CHLORIDE: 103 meq/L (ref 96–112)
CO2: 27 meq/L (ref 19–32)
CREATININE: 0.55 mg/dL (ref 0.50–1.10)
GFR, Est Non African American: >89 mL/min
Glucose, Bld: 91 mg/dL (ref 70–99)
Potassium: 4.3 mEq/L (ref 3.5–5.3)
Sodium: 140 mEq/L (ref 135–145)
Total Bilirubin: 0.3 mg/dL (ref 0.2–1.2)
Total Protein: 6.5 g/dL (ref 6.0–8.3)

## 2013-02-14 LAB — HEMOGLOBIN A1C
Hgb A1c MFr Bld: 6 % — ABNORMAL HIGH (ref <5.7)
Mean Plasma Glucose: 126 mg/dL — ABNORMAL HIGH (ref <117)

## 2013-02-14 LAB — LIPID PANEL
Cholesterol: 140 mg/dL (ref 0–200)
HDL: 37 mg/dL — ABNORMAL LOW (ref >39)
LDL CALC: 78 mg/dL (ref 0–99)
TRIGLYCERIDES: 124 mg/dL (ref <150)
Total CHOL/HDL Ratio: 3.8 Ratio
VLDL: 25 mg/dL (ref 0–40)

## 2013-02-14 NOTE — Progress Notes (Signed)
Patient ID: Patricia Jones MRN: [id number], DOB: 1955/06/27, 58 y.o. Date of Encounter: @DATE @  Chief Complaint:  Chief Complaint  Patient presents with  . diabetic check up    intersted in class at [facility], excessively tired, c/o flank swelling, has question about Hepatitis C    HPI: 58 y.o. year old female  presents with :  Says that recently when she saw Dr. Bubba Camp regarding her thyroid, Dr. York Spaniel did an A1c which was elevated. However, secondary to the patient's insurance, pt was told  that she would have to have another referral in order for Dr. Bubba Camp to manage the diabetes. Says that she only had a referral to see Dr. Bubba Camp regarding her thyroid. Therefore, patient states that Dr. Bubba Camp did A1c but that she has done no followup or management of the condition. She is here for followup evaluation of this today.    Past Medical History  Diagnosis Date  . Depression   . Migraines   . Fibromyalgia   . Sleep apnea     CPAP  . S/P colonoscopy 10/29/1999    Dr Arita Miss polyps-hyperplastic  . GERD (gastroesophageal reflux disease)   . Hepatitis C 1980    symptomatic w/ jaundice, NEGATIVE HCV RNA  08/2005  . Allergy   . Tobacco dependence   . Insomnia   . S/P colonoscopy 09/12/2005    4 hyperplastic polyps  . DJD (degenerative joint disease)   . Chronic migraine without aura, with intractable migraine, so stated, without mention of status migrainosus 12/08/2012  . Lumbago 12/08/2012  . Myalgia and myositis, unspecified 12/08/2012     Home Meds: See attached medication section for current medication list. Any medications entered into computer today will not appear on this note's list. The medications listed below were entered prior to today. Current Outpatient Prescriptions on File Prior to Visit  Medication Sig Dispense Refill  . amphetamine-dextroamphetamine (ADDERALL) 20 MG tablet Take 20 mg by mouth daily.        . butalbital-acetaminophen-caffeine (FIORICET, ESGIC)  50-325-40 MG per tablet       . carisoprodol (SOMA) 350 MG tablet 350 mg 3 (three) times daily as needed.       . diazepam (VALIUM) 5 MG tablet Take 5 mg by mouth as needed (1-2 a day).       Marland Kitchen FLUoxetine (PROZAC) 40 MG capsule Take 40 mg by mouth Daily.       Marland Kitchen gabapentin (NEURONTIN) 600 MG tablet Take by mouth 3 (three) times daily as needed.       . methadone (DOLOPHINE) 5 MG tablet Take 5 mg by mouth 4 (four) times daily as needed.       . OnabotulinumtoxinA (BOTOX IJ) Inject as directed. q45mths for migraines      . SUMAtriptan (IMITREX) 6 MG/0.5ML SOLN Inject 6 mg into the skin as needed.       Marland Kitchen HYDROcodone-acetaminophen (NORCO) 5-325 MG per tablet One po q 4-6 hrs prn pain  20 tablet  0  . omeprazole (PRILOSEC) 20 MG capsule Take 1 capsule (20 mg total) by mouth daily.  30 capsule  5   No current facility-administered medications on file prior to visit.    Allergies:  Allergies  Allergen Reactions  . Penicillins Rash  . Sulfonamide Derivatives Rash    History   Social History  . Marital Status: Divorced    Spouse Name: N/A    Number of Children: 2  . Years of Education:  N/A   Occupational History  . disabled      previously RN @ [employer]   Social History Main Topics  . Smoking status: Current Every Day Smoker -- 1.00 packs/day for 40 years    Types: Cigarettes  . Smokeless tobacco: Never Used  . Alcohol Use: No     Comment: heavy etoh x 30 yrs, quit 14 yrs ago  . Drug Use: No     Comment: maijuana use  . Sexual Activity: Yes    Partners: Male     Comment: TAH   Other Topics Concern  . Not on file   Social History Narrative  . No narrative on file    Family History  Problem Relation Age of Onset  . Aneurysm Mother   . Heart disease Mother   . Coronary artery disease Father   . Stroke Father   . Heart disease Father   . Ovarian cancer Sister   . Colon cancer Maternal Grandmother   . Diabetes Maternal Aunt      Review of Systems:  See HPI for pertinent  ROS. All other ROS negative.    Physical Exam: Blood pressure 96/64, pulse 68, temperature 98.1 F (36.7 C), temperature source Oral, resp. rate 18, height 5' 7.75" (1.721 m), weight 165 lb (74.844 kg)., Body mass index is 25.27 kg/(m^2). General: WF. Talks very slowly, which is chronic for her. Appears in no acute distress. Neck: Supple. No thyromegaly. No lymphadenopathy. No carotid bruit. Lungs: Clear bilaterally to auscultation without wheezes, rales, or rhonchi. Breathing is unlabored. Heart: RRR with S1 S2. No murmurs, rubs, or gallops. Abdomen: Soft, non-tender, non-distended with normoactive bowel sounds. No hepatomegaly. No rebound/guarding. No obvious abdominal masses. Musculoskeletal:  Strength and tone normal for age. Extremities/Skin: Warm and dry.  Neuro: Alert and oriented X 3. Moves all extremities spontaneously. Gait is normal. CNII-XII grossly in tact. Psych:  Responds to questions appropriately with a normal affect.     ASSESSMENT AND PLAN:  58 y.o. year old female with  1. Hyperglycemia A1c done on 11/16/12 by Dr. Bubba Camp was 6.5. This is scanned into the computer. We will recheck A1c now. I have given her a handout regarding low carbohydrate diet. - COMPLETE METABOLIC PANEL WITH GFR - Hemoglobin A1c  2. Hyperlipidemia I reviewed her labs and at back as well as in her paper chart. I see no recent lipid panel. Last one in the paper chart was 02/19/10. We'll go ahead and recheck lipids now. She does develop diabetes, and her goal LDL will be much lower than it was prior to the diagnosis of hyperglycemia/diabetes. - COMPLETE METABOLIC PANEL WITH GFR - Lipid panel  Followup office visit in 3 months.   Will further discuss eye exam and foot exams at that visit.   blood pressure is low at 96/64.   [address] [place], [place], [facility] 02/14/2013 11:29 AM

## 2013-03-17 ENCOUNTER — Ambulatory Visit (INDEPENDENT_AMBULATORY_CARE_PROVIDER_SITE_OTHER): Payer: Medicare HMO

## 2013-03-17 DIAGNOSIS — M810 Age-related osteoporosis without current pathological fracture: Secondary | ICD-10-CM

## 2013-05-19 ENCOUNTER — Ambulatory Visit: Payer: Self-pay | Admitting: Physician Assistant

## 2013-06-15 ENCOUNTER — Other Ambulatory Visit: Payer: Self-pay | Admitting: Neurology

## 2013-06-15 DIAGNOSIS — M5416 Radiculopathy, lumbar region: Secondary | ICD-10-CM

## 2013-06-24 ENCOUNTER — Encounter (HOSPITAL_COMMUNITY): Payer: Self-pay

## 2013-06-24 ENCOUNTER — Ambulatory Visit (HOSPITAL_COMMUNITY)
Admission: RE | Admit: 2013-06-24 | Discharge: 2013-06-24 | Disposition: A | Discharge status: Home / Self Care | Payer: Medicare HMO | Source: Ambulatory Visit | Attending: Neurology | Admitting: Neurology

## 2013-06-24 DIAGNOSIS — K8020 Calculus of gallbladder without cholecystitis without obstruction: Secondary | ICD-10-CM | POA: Insufficient documentation

## 2013-06-24 DIAGNOSIS — M47817 Spondylosis without myelopathy or radiculopathy, lumbosacral region: Secondary | ICD-10-CM | POA: Insufficient documentation

## 2013-06-24 DIAGNOSIS — M5126 Other intervertebral disc displacement, lumbar region: Secondary | ICD-10-CM | POA: Diagnosis not present

## 2013-06-24 DIAGNOSIS — M5137 Other intervertebral disc degeneration, lumbosacral region: Secondary | ICD-10-CM | POA: Diagnosis not present

## 2013-06-24 DIAGNOSIS — M5416 Radiculopathy, lumbar region: Secondary | ICD-10-CM

## 2013-06-24 DIAGNOSIS — IMO0002 Reserved for concepts with insufficient information to code with codable children: Secondary | ICD-10-CM | POA: Insufficient documentation

## 2013-06-24 DIAGNOSIS — M51379 Other intervertebral disc degeneration, lumbosacral region without mention of lumbar back pain or lower extremity pain: Secondary | ICD-10-CM | POA: Insufficient documentation

## 2013-09-10 ENCOUNTER — Encounter: Payer: Self-pay | Admitting: *Deleted

## 2013-11-03 ENCOUNTER — Encounter: Payer: Self-pay | Admitting: Family Medicine

## 2013-11-03 DIAGNOSIS — M797 Fibromyalgia: Secondary | ICD-10-CM | POA: Insufficient documentation

## 2013-11-03 DIAGNOSIS — G43909 Migraine, unspecified, not intractable, without status migrainosus: Secondary | ICD-10-CM | POA: Insufficient documentation

## 2013-11-03 DIAGNOSIS — G609 Hereditary and idiopathic neuropathy, unspecified: Secondary | ICD-10-CM | POA: Insufficient documentation

## 2013-11-03 DIAGNOSIS — M5416 Radiculopathy, lumbar region: Secondary | ICD-10-CM | POA: Insufficient documentation

## 2013-11-03 DIAGNOSIS — N3281 Overactive bladder: Secondary | ICD-10-CM | POA: Insufficient documentation

## 2013-11-03 DIAGNOSIS — G43101 Migraine with aura, not intractable, with status migrainosus: Secondary | ICD-10-CM | POA: Insufficient documentation

## 2013-11-03 DIAGNOSIS — G4710 Hypersomnia, unspecified: Secondary | ICD-10-CM | POA: Insufficient documentation

## 2013-11-03 DIAGNOSIS — E119 Type 2 diabetes mellitus without complications: Secondary | ICD-10-CM | POA: Insufficient documentation

## 2013-11-03 DIAGNOSIS — G4701 Insomnia due to medical condition: Secondary | ICD-10-CM | POA: Insufficient documentation

## 2013-11-03 DIAGNOSIS — G4730 Sleep apnea, unspecified: Secondary | ICD-10-CM

## 2013-11-14 ENCOUNTER — Encounter: Payer: Self-pay | Admitting: *Deleted

## 2014-01-09 ENCOUNTER — Other Ambulatory Visit: Payer: Self-pay

## 2014-01-09 NOTE — Telephone Encounter (Signed)
This patient has compliance issues. Please forward this message to Sharrie Rothman. This patient has not been seen in the office for a while. We need to set an appointment for her to  See me for annual exam and to discuss treatment for her osteoporosis. We will need to get baseline blood work as well. Will need to see if she would qualify for assistance for Forteo if not will need to put her on Fosomax. Will discuss at office appointment.

## 2014-01-10 ENCOUNTER — Encounter: Payer: Self-pay | Admitting: *Deleted

## 2014-01-12 NOTE — Telephone Encounter (Signed)
I have had scheduling contact the patient to an annual exam in order to continue Forteo. She never called them back. I have just recently mailed the patient a letter to advise needs her annual exam in order to continue [place]. KW CMA

## 2014-01-20 DIAGNOSIS — M5416 Radiculopathy, lumbar region: Secondary | ICD-10-CM | POA: Diagnosis not present

## 2014-01-20 DIAGNOSIS — M545 Low back pain: Secondary | ICD-10-CM | POA: Diagnosis not present

## 2014-02-16 DIAGNOSIS — M5416 Radiculopathy, lumbar region: Secondary | ICD-10-CM | POA: Diagnosis not present

## 2014-02-16 DIAGNOSIS — M545 Low back pain: Secondary | ICD-10-CM | POA: Diagnosis not present

## 2014-02-16 DIAGNOSIS — G4701 Insomnia due to medical condition: Secondary | ICD-10-CM | POA: Diagnosis not present

## 2014-02-16 DIAGNOSIS — M79603 Pain in arm, unspecified: Secondary | ICD-10-CM | POA: Diagnosis not present

## 2014-02-16 DIAGNOSIS — M533 Sacrococcygeal disorders, not elsewhere classified: Secondary | ICD-10-CM | POA: Diagnosis not present

## 2014-02-16 DIAGNOSIS — M797 Fibromyalgia: Secondary | ICD-10-CM | POA: Diagnosis not present

## 2014-03-08 ENCOUNTER — Encounter: Payer: Self-pay | Admitting: Physician Assistant

## 2014-03-08 ENCOUNTER — Ambulatory Visit (INDEPENDENT_AMBULATORY_CARE_PROVIDER_SITE_OTHER): Payer: Medicare HMO | Admitting: Physician Assistant

## 2014-03-08 VITALS — BP 118/82 | HR 92 | Temp 98.3°F | Resp 20 | Wt 156.0 lb

## 2014-03-08 DIAGNOSIS — F17200 Nicotine dependence, unspecified, uncomplicated: Secondary | ICD-10-CM | POA: Insufficient documentation

## 2014-03-08 DIAGNOSIS — M778 Other enthesopathies, not elsewhere classified: Secondary | ICD-10-CM

## 2014-03-08 DIAGNOSIS — M7582 Other shoulder lesions, left shoulder: Secondary | ICD-10-CM

## 2014-03-08 NOTE — Progress Notes (Signed)
Patient ID: Patricia Jones MRN: [id number], DOB: [birth year], 59 y.o. Date of Encounter: 03/08/2014, 1:51 PM    Chief Complaint:  Chief Complaint  Patient presents with  . c/o severe left arm pain x 2 weeks    getting worse, has lumps in arm very painful     HPI: 59 y.o. year old white female says that she has been having pain in the left upper arm.  Says that she has no past history of problems with the left shoulder or arm. Says that she has had no surgery to the left shoulder or arm. Says there has been no trauma or injury to that area. Says that she has done no activity involving the left upper arm. Says that the left upper arm hurts all the time when she just sitting at rest or even just trying to sleep she feels discomfort there.     Home Meds:   Outpatient Prescriptions Prior to Visit  Medication Sig Dispense Refill  . Blood Glucose Calibration (EMBRACE CONTROL) LOW SOLN     . Blood Glucose Monitoring Suppl (EMBRACE BLOOD GLUCOSE MONITOR) DEVI     . butalbital-acetaminophen-caffeine (FIORICET, ESGIC) 50-325-40 MG per tablet     . Calcium Carbonate (CALCIUM 600 PO) Take 1 tablet by mouth daily.    . Cholecalciferol (VITAMIN D) 2000 UNITS tablet Take 2,000 Units by mouth daily.    . cyclobenzaprine (FLEXERIL) 10 MG tablet Take 10 mg by mouth 3 (three) times daily as needed for muscle spasms.    . diazepam (VALIUM) 5 MG tablet Take 5 mg by mouth as needed (1-2 a day).     Rosealee Albee BLOOD GLUCOSE TEST test strip     . esomeprazole (NEXIUM) 40 MG capsule Take 40 mg by mouth daily at 12 noon.    Marland Kitchen FLUoxetine (PROZAC) 40 MG capsule Take 40 mg by mouth Daily.     Marland Kitchen FORTEO 600 MCG/2.4ML SOLN daily.    Marland Kitchen gabapentin (NEURONTIN) 600 MG tablet Take by mouth 3 (three) times daily as needed.     Elmore Guise Devices (SIMPLE DIAGNOSTICS LANCING DEV) MISC     . methadone (DOLOPHINE) 5 MG tablet Take 5 mg by mouth 4 (four) times daily as needed.     Marland Kitchen PHARMACIST CHOICE LANCETS MISC       . SUMAtriptan (IMITREX) 6 MG/0.5ML SOLN Inject 6 mg into the skin as needed.     . OnabotulinumtoxinA (BOTOX IJ) Inject as directed. q53mths for migraines    . amphetamine-dextroamphetamine (ADDERALL) 20 MG tablet Take 20 mg by mouth daily.      . carisoprodol (SOMA) 350 MG tablet 350 mg 3 (three) times daily as needed.     Marland Kitchen HYDROcodone-acetaminophen (NORCO) 5-325 MG per tablet One po q 4-6 hrs prn pain 20 tablet 0  . omeprazole (PRILOSEC) 20 MG capsule Take 1 capsule (20 mg total) by mouth daily. 30 capsule 5   No facility-administered medications prior to visit.    Allergies:  Allergies  Allergen Reactions  . Penicillins Rash  . Sulfonamide Derivatives Rash      Review of Systems: See HPI for pertinent ROS. All other ROS negative.    Physical Exam: Blood pressure 118/82, pulse 92, temperature 98.3 F (36.8 C), temperature source Oral, resp. rate 20, weight 156 lb (70.761 kg)., Body mass index is 23.89 kg/(m^2). General:  WF. Ashen color skin. Unkept appearance.  Neck: Supple. No thyromegaly. No lymphadenopathy. Lungs: Clear bilaterally to auscultation without wheezes,  rales, or rhonchi. Breathing is unlabored. Heart: Regular rhythm. No murmurs, rubs, or gallops. Msk:  She is sitting on exam table, with very poor posture, with both shoulders rounded forward and head drooped forward and downward.  Has left arm in, close to body, holding it there with the right arm.  Left Shoulder--Forward Extension: Can extend to almost 90 degree angle. Causes pain at upper arm.  Left Shoulder --Abduction---Can abduct most of the way but then c/o pain at upper arm.  Forearm Strength with Adduction and Abduction- 5/5.  Palpation: When I palpate upper arm--area of bicep and tricep--- she litererally jumps.  When I palpate the shoulder joint and AC joint, she does not jump and does not report pain with palpation of those sites. Extremities/Skin: Warm and dry. Neuro: Alert and oriented X 3. Moves  all extremities spontaneously. Gait is normal. CNII-XII grossly in tact. Psych:  Responds to questions appropriately with a normal affect.     ASSESSMENT AND PLAN:  59 y.o. year old female with  1. Left shoulder tendonitis I discussed with patient that I think a lot of her pain is secondary to her poor posture. Discussed the muscle strain being created by her head drooping forward and downward  and by her shoulders pulling forward. She already has the following medications--- Flexeril, Valium, Neurontin, methadone. I asked about the frequency of her visits at pain clinic. He says that she was there every fourth and goes there every 2 months. I discussed to make sure she does not keep the left arm still and to make sure she does range of motion movement with that shoulder throughout the day to prevent frozen shoulder. I gave her a handout with specific stretches that she can do at home to stretch the left shoulder and left upper arm. Discussed improvements with her posture.    Signed, [address] [place], [place], [facility] 03/08/2014 1:51 PM

## 2014-04-11 ENCOUNTER — Encounter: Payer: Self-pay | Admitting: *Deleted

## 2014-04-11 NOTE — Patient Outreach (Signed)
[facility]) Care Management  04/11/2014  Patricia Jones Sep 11, 1955 [id number]   Previous notes in [employer].  Referral per [facility].  Attempts to contact patient via phone calls and outreach letter have been unsuccessful.   This case will be closed out.  MD closure letter sent.  [name], RN BSN [facility] Management Coordinator [facility] Care Management  [phone]

## 2014-04-14 NOTE — Patient Outreach (Signed)
[facility]) Care Management  04/14/2014  ALYSSAMARIE MOUNSEY 1955/04/11 [id number]  See Legacy system for previous notes.  No patient contact after 3 call attempts and outreach letter.   Plan: Will send MD closure letter; close out case.

## 2014-04-20 NOTE — Patient Outreach (Signed)
[facility]) Care Management  04/20/2014  Patricia Jones 12/03/1955 [id number]   Received notification from [name], RN to close case due to unable to contact patient for [facility] Management services.  Case closed at this time.  [name]. [place] CM Assistant Phone: [phone] Fax: [phone]

## 2014-05-30 DIAGNOSIS — M79603 Pain in arm, unspecified: Secondary | ICD-10-CM | POA: Diagnosis not present

## 2014-05-30 DIAGNOSIS — G4701 Insomnia due to medical condition: Secondary | ICD-10-CM | POA: Diagnosis not present

## 2014-05-30 DIAGNOSIS — G43909 Migraine, unspecified, not intractable, without status migrainosus: Secondary | ICD-10-CM | POA: Diagnosis not present

## 2014-05-30 DIAGNOSIS — M533 Sacrococcygeal disorders, not elsewhere classified: Secondary | ICD-10-CM | POA: Diagnosis not present

## 2014-05-30 DIAGNOSIS — M797 Fibromyalgia: Secondary | ICD-10-CM | POA: Diagnosis not present

## 2014-06-09 DIAGNOSIS — M545 Low back pain: Secondary | ICD-10-CM | POA: Diagnosis not present

## 2014-06-09 DIAGNOSIS — M5416 Radiculopathy, lumbar region: Secondary | ICD-10-CM | POA: Diagnosis not present

## 2014-07-26 DIAGNOSIS — M533 Sacrococcygeal disorders, not elsewhere classified: Secondary | ICD-10-CM | POA: Diagnosis not present

## 2014-07-26 DIAGNOSIS — M79603 Pain in arm, unspecified: Secondary | ICD-10-CM | POA: Diagnosis not present

## 2014-07-26 DIAGNOSIS — M545 Low back pain: Secondary | ICD-10-CM | POA: Diagnosis not present

## 2014-07-26 DIAGNOSIS — R252 Cramp and spasm: Secondary | ICD-10-CM | POA: Diagnosis not present

## 2014-07-26 DIAGNOSIS — M797 Fibromyalgia: Secondary | ICD-10-CM | POA: Diagnosis not present

## 2014-07-26 DIAGNOSIS — G4701 Insomnia due to medical condition: Secondary | ICD-10-CM | POA: Diagnosis not present

## 2014-09-26 DIAGNOSIS — R252 Cramp and spasm: Secondary | ICD-10-CM | POA: Diagnosis not present

## 2014-09-26 DIAGNOSIS — G4701 Insomnia due to medical condition: Secondary | ICD-10-CM | POA: Diagnosis not present

## 2014-09-26 DIAGNOSIS — M533 Sacrococcygeal disorders, not elsewhere classified: Secondary | ICD-10-CM | POA: Diagnosis not present

## 2014-09-26 DIAGNOSIS — M797 Fibromyalgia: Secondary | ICD-10-CM | POA: Diagnosis not present

## 2014-10-04 DIAGNOSIS — M545 Low back pain: Secondary | ICD-10-CM | POA: Diagnosis not present

## 2014-10-04 DIAGNOSIS — G43909 Migraine, unspecified, not intractable, without status migrainosus: Secondary | ICD-10-CM | POA: Diagnosis not present

## 2014-10-04 DIAGNOSIS — R252 Cramp and spasm: Secondary | ICD-10-CM | POA: Diagnosis not present

## 2014-10-04 DIAGNOSIS — M533 Sacrococcygeal disorders, not elsewhere classified: Secondary | ICD-10-CM | POA: Diagnosis not present

## 2014-10-04 DIAGNOSIS — M797 Fibromyalgia: Secondary | ICD-10-CM | POA: Diagnosis not present

## 2014-10-04 DIAGNOSIS — G4701 Insomnia due to medical condition: Secondary | ICD-10-CM | POA: Diagnosis not present

## 2014-10-17 DIAGNOSIS — E538 Deficiency of other specified B group vitamins: Secondary | ICD-10-CM | POA: Diagnosis not present

## 2014-10-17 DIAGNOSIS — E559 Vitamin D deficiency, unspecified: Secondary | ICD-10-CM | POA: Diagnosis not present

## 2014-10-17 DIAGNOSIS — Z79899 Other long term (current) drug therapy: Secondary | ICD-10-CM | POA: Diagnosis not present

## 2014-10-17 DIAGNOSIS — M818 Other osteoporosis without current pathological fracture: Secondary | ICD-10-CM | POA: Diagnosis not present

## 2014-10-17 DIAGNOSIS — R5383 Other fatigue: Secondary | ICD-10-CM | POA: Diagnosis not present

## 2014-10-18 DIAGNOSIS — M545 Low back pain: Secondary | ICD-10-CM | POA: Diagnosis not present

## 2014-10-18 DIAGNOSIS — R252 Cramp and spasm: Secondary | ICD-10-CM | POA: Diagnosis not present

## 2014-10-18 DIAGNOSIS — Z79899 Other long term (current) drug therapy: Secondary | ICD-10-CM | POA: Diagnosis not present

## 2014-10-18 DIAGNOSIS — M5416 Radiculopathy, lumbar region: Secondary | ICD-10-CM | POA: Diagnosis not present

## 2014-10-18 DIAGNOSIS — M533 Sacrococcygeal disorders, not elsewhere classified: Secondary | ICD-10-CM | POA: Diagnosis not present

## 2014-10-18 DIAGNOSIS — G609 Hereditary and idiopathic neuropathy, unspecified: Secondary | ICD-10-CM | POA: Diagnosis not present

## 2014-11-10 DIAGNOSIS — M5416 Radiculopathy, lumbar region: Secondary | ICD-10-CM | POA: Diagnosis not present

## 2014-11-10 DIAGNOSIS — M545 Low back pain: Secondary | ICD-10-CM | POA: Diagnosis not present

## 2014-12-14 DIAGNOSIS — E119 Type 2 diabetes mellitus without complications: Secondary | ICD-10-CM | POA: Diagnosis not present

## 2014-12-14 DIAGNOSIS — G43909 Migraine, unspecified, not intractable, without status migrainosus: Secondary | ICD-10-CM | POA: Diagnosis not present

## 2014-12-14 DIAGNOSIS — G609 Hereditary and idiopathic neuropathy, unspecified: Secondary | ICD-10-CM | POA: Diagnosis not present

## 2014-12-14 DIAGNOSIS — G4701 Insomnia due to medical condition: Secondary | ICD-10-CM | POA: Diagnosis not present

## 2014-12-14 DIAGNOSIS — K219 Gastro-esophageal reflux disease without esophagitis: Secondary | ICD-10-CM | POA: Diagnosis not present

## 2014-12-14 DIAGNOSIS — E559 Vitamin D deficiency, unspecified: Secondary | ICD-10-CM | POA: Diagnosis not present

## 2014-12-14 DIAGNOSIS — M797 Fibromyalgia: Secondary | ICD-10-CM | POA: Diagnosis not present

## 2014-12-14 DIAGNOSIS — G4710 Hypersomnia, unspecified: Secondary | ICD-10-CM | POA: Diagnosis not present

## 2015-02-12 DIAGNOSIS — M545 Low back pain: Secondary | ICD-10-CM | POA: Diagnosis not present

## 2015-02-12 DIAGNOSIS — G4701 Insomnia due to medical condition: Secondary | ICD-10-CM | POA: Diagnosis not present

## 2015-02-12 DIAGNOSIS — M797 Fibromyalgia: Secondary | ICD-10-CM | POA: Diagnosis not present

## 2015-02-12 DIAGNOSIS — G43909 Migraine, unspecified, not intractable, without status migrainosus: Secondary | ICD-10-CM | POA: Diagnosis not present

## 2015-02-12 DIAGNOSIS — G609 Hereditary and idiopathic neuropathy, unspecified: Secondary | ICD-10-CM | POA: Diagnosis not present

## 2015-02-12 DIAGNOSIS — M5416 Radiculopathy, lumbar region: Secondary | ICD-10-CM | POA: Diagnosis not present

## 2015-02-12 DIAGNOSIS — Z79899 Other long term (current) drug therapy: Secondary | ICD-10-CM | POA: Diagnosis not present

## 2015-02-26 ENCOUNTER — Ambulatory Visit: Payer: Medicare HMO | Admitting: Physician Assistant

## 2015-05-15 DIAGNOSIS — G43909 Migraine, unspecified, not intractable, without status migrainosus: Secondary | ICD-10-CM | POA: Diagnosis not present

## 2015-05-15 DIAGNOSIS — M545 Low back pain: Secondary | ICD-10-CM | POA: Diagnosis not present

## 2015-05-15 DIAGNOSIS — M797 Fibromyalgia: Secondary | ICD-10-CM | POA: Diagnosis not present

## 2015-05-15 DIAGNOSIS — Z79899 Other long term (current) drug therapy: Secondary | ICD-10-CM | POA: Diagnosis not present

## 2015-05-15 DIAGNOSIS — G4710 Hypersomnia, unspecified: Secondary | ICD-10-CM | POA: Diagnosis not present

## 2015-05-15 DIAGNOSIS — E119 Type 2 diabetes mellitus without complications: Secondary | ICD-10-CM | POA: Diagnosis not present

## 2015-05-15 DIAGNOSIS — K219 Gastro-esophageal reflux disease without esophagitis: Secondary | ICD-10-CM | POA: Diagnosis not present

## 2015-05-15 DIAGNOSIS — G4701 Insomnia due to medical condition: Secondary | ICD-10-CM | POA: Diagnosis not present

## 2015-05-15 DIAGNOSIS — G609 Hereditary and idiopathic neuropathy, unspecified: Secondary | ICD-10-CM | POA: Diagnosis not present

## 2015-06-12 ENCOUNTER — Telehealth: Payer: Self-pay | Admitting: Physician Assistant

## 2015-06-12 NOTE — Telephone Encounter (Signed)
Patient has appt with dr Merlene Laughter on Thursday, and she would need referral for this asap if possible

## 2015-06-12 NOTE — Telephone Encounter (Signed)
Has been seeing him for several years.  Needs referral renewal.  Sleep apnea, fibromyalgia. Seeing him Thursday.  Referral submitted and approved Auth [id number] 6 visits from 06/14/15 - 12/11/15  DX M53.3, G47.01 and M79.7

## 2015-06-14 ENCOUNTER — Ambulatory Visit (HOSPITAL_COMMUNITY)
Admission: RE | Admit: 2015-06-14 | Discharge: 2015-06-14 | Disposition: A | Discharge status: Home / Self Care | Payer: Medicare HMO | Source: Ambulatory Visit | Attending: Neurology | Admitting: Neurology

## 2015-06-14 ENCOUNTER — Other Ambulatory Visit (HOSPITAL_COMMUNITY): Payer: Self-pay | Admitting: Neurology

## 2015-06-14 DIAGNOSIS — M542 Cervicalgia: Secondary | ICD-10-CM

## 2015-06-14 DIAGNOSIS — M50322 Other cervical disc degeneration at C5-C6 level: Secondary | ICD-10-CM | POA: Diagnosis not present

## 2015-06-14 DIAGNOSIS — E119 Type 2 diabetes mellitus without complications: Secondary | ICD-10-CM | POA: Diagnosis not present

## 2015-06-14 DIAGNOSIS — G43909 Migraine, unspecified, not intractable, without status migrainosus: Secondary | ICD-10-CM | POA: Diagnosis not present

## 2015-06-14 DIAGNOSIS — G609 Hereditary and idiopathic neuropathy, unspecified: Secondary | ICD-10-CM | POA: Diagnosis not present

## 2015-06-14 DIAGNOSIS — M129 Arthropathy, unspecified: Secondary | ICD-10-CM | POA: Insufficient documentation

## 2015-06-14 DIAGNOSIS — M5416 Radiculopathy, lumbar region: Secondary | ICD-10-CM | POA: Diagnosis not present

## 2015-06-14 DIAGNOSIS — M4310 Spondylolisthesis, site unspecified: Secondary | ICD-10-CM | POA: Diagnosis not present

## 2015-06-14 DIAGNOSIS — M797 Fibromyalgia: Secondary | ICD-10-CM | POA: Diagnosis not present

## 2015-06-14 DIAGNOSIS — M545 Low back pain: Secondary | ICD-10-CM | POA: Diagnosis not present

## 2015-06-14 DIAGNOSIS — Z79899 Other long term (current) drug therapy: Secondary | ICD-10-CM | POA: Diagnosis not present

## 2015-06-14 DIAGNOSIS — G4701 Insomnia due to medical condition: Secondary | ICD-10-CM | POA: Diagnosis not present

## 2015-06-14 DIAGNOSIS — K219 Gastro-esophageal reflux disease without esophagitis: Secondary | ICD-10-CM | POA: Diagnosis not present

## 2015-06-14 DIAGNOSIS — G4710 Hypersomnia, unspecified: Secondary | ICD-10-CM | POA: Diagnosis not present

## 2015-06-18 ENCOUNTER — Telehealth: Payer: Self-pay | Admitting: Physician Assistant

## 2015-06-18 NOTE — Telephone Encounter (Signed)
Received fax from [facility] back Auth# [id number] Treating Provider Dr. Phillips Odor # of Visits 6 Start Date 06/14/2015 End Date 12/11/2015 CPT 99499 DX M53.3, G47.01, and M79.7

## 2015-06-28 ENCOUNTER — Other Ambulatory Visit: Payer: Self-pay | Admitting: Physician Assistant

## 2015-06-28 ENCOUNTER — Encounter: Payer: Self-pay | Admitting: Physician Assistant

## 2015-06-28 ENCOUNTER — Ambulatory Visit (HOSPITAL_COMMUNITY)
Admission: RE | Admit: 2015-06-28 | Discharge: 2015-06-28 | Disposition: A | Discharge status: Home / Self Care | Payer: Commercial Managed Care - HMO | Source: Ambulatory Visit | Attending: Physician Assistant | Admitting: Physician Assistant

## 2015-06-28 ENCOUNTER — Ambulatory Visit (INDEPENDENT_AMBULATORY_CARE_PROVIDER_SITE_OTHER): Payer: Commercial Managed Care - HMO | Admitting: Physician Assistant

## 2015-06-28 VITALS — BP 104/64 | HR 64 | Temp 98.1°F | Resp 18 | Ht 66.5 in | Wt 149.0 lb

## 2015-06-28 DIAGNOSIS — M542 Cervicalgia: Secondary | ICD-10-CM

## 2015-06-28 DIAGNOSIS — Z23 Encounter for immunization: Secondary | ICD-10-CM

## 2015-06-28 DIAGNOSIS — R918 Other nonspecific abnormal finding of lung field: Secondary | ICD-10-CM | POA: Insufficient documentation

## 2015-06-28 DIAGNOSIS — Z0000 Encounter for general adult medical examination without abnormal findings: Secondary | ICD-10-CM

## 2015-06-28 DIAGNOSIS — M419 Scoliosis, unspecified: Secondary | ICD-10-CM | POA: Insufficient documentation

## 2015-06-28 DIAGNOSIS — M546 Pain in thoracic spine: Secondary | ICD-10-CM | POA: Insufficient documentation

## 2015-06-28 DIAGNOSIS — S199XXA Unspecified injury of neck, initial encounter: Secondary | ICD-10-CM | POA: Diagnosis not present

## 2015-06-28 DIAGNOSIS — Z1329 Encounter for screening for other suspected endocrine disorder: Secondary | ICD-10-CM | POA: Diagnosis not present

## 2015-06-28 DIAGNOSIS — M8580 Other specified disorders of bone density and structure, unspecified site: Secondary | ICD-10-CM | POA: Insufficient documentation

## 2015-06-28 DIAGNOSIS — Z13220 Encounter for screening for lipoid disorders: Secondary | ICD-10-CM | POA: Diagnosis not present

## 2015-06-28 DIAGNOSIS — R05 Cough: Secondary | ICD-10-CM | POA: Diagnosis not present

## 2015-06-28 DIAGNOSIS — E119 Type 2 diabetes mellitus without complications: Secondary | ICD-10-CM | POA: Diagnosis not present

## 2015-06-28 DIAGNOSIS — Z1231 Encounter for screening mammogram for malignant neoplasm of breast: Secondary | ICD-10-CM

## 2015-06-28 LAB — CBC WITH DIFFERENTIAL/PLATELET
BASOS ABS: 0 {cells}/uL (ref 0–200)
Basophils Relative: 0 %
EOS PCT: 4 %
Eosinophils Absolute: 520 cells/uL — ABNORMAL HIGH (ref 15–500)
HCT: 42.8 % (ref 35.0–45.0)
Hemoglobin: 13.9 g/dL (ref 12.0–15.0)
LYMPHS ABS: 3120 {cells}/uL (ref 850–3900)
LYMPHS PCT: 24 %
MCH: 28.3 pg (ref 27.0–33.0)
MCHC: 32.5 g/dL (ref 32.0–36.0)
MCV: 87 fL (ref 80.0–100.0)
MPV: 9.6 fL (ref 7.5–12.5)
Monocytes Absolute: 910 cells/uL (ref 200–950)
Monocytes Relative: 7 %
NEUTROS PCT: 65 %
Neutro Abs: 8450 cells/uL — ABNORMAL HIGH (ref 1500–7800)
Platelets: 278 10*3/uL (ref 140–400)
RBC: 4.92 MIL/uL (ref 3.80–5.10)
RDW: 14.3 % (ref 11.0–15.0)
WBC: 13 10*3/uL — AB (ref 3.8–10.8)

## 2015-06-28 NOTE — Progress Notes (Signed)
Patient ID: ISYSS PATELLA MRN: [id number], DOB: May 02, 1955, 60 y.o. Date of Encounter: 06/28/2015,   Chief Complaint: Physical (CPE)  HPI: 60 y.o. y/o female  here for CPE.   Says she has to have Referral from [place] each year for to cont to see Dr. Merlene Laughter. Says we already sent that in for her but she had to come for check up today b/c she had not seen [place] recently.   Says that yesterday evening she was sitting on side of her bed, with head bent forward. Fell asleep. Fell onto floor. Wants to check XRay of neck and upper back b/c since that fall is having some achiness in those areas.  Also wants CXR to check for lung cnaner since she smokes.   No other complaint/concerns.  Says she does see Gyn for Gyn exams. However, did not have mammogram in past year so agreeable for me to go ahead and order this but defers Gyn exam today as she sees Gyn.   Review of Systems: Consitutional: No fever, chills, fatigue, night sweats, lymphadenopathy. No significant/unexplained weight changes. Eyes: No visual changes, eye redness, or discharge. ENT/Mouth: No ear pain, sore throat, nasal drainage, or sinus pain. Cardiovascular: No chest pressure,heaviness, tightness or squeezing, even with exertion. No increased shortness of breath or dyspnea on exertion.No palpitations, edema, orthopnea, PND. Respiratory: No cough, hemoptysis, SOB, or wheezing. Gastrointestinal: No anorexia, dysphagia, reflux, pain, nausea, vomiting, hematemesis, diarrhea, constipation, BRBPR, or melena. Breast: No mass, nodules, bulging, or retraction. No skin changes or inflammation. No nipple discharge. No lymphadenopathy. Genitourinary: No dysuria, hematuria, incontinence, vaginal discharge, pruritis, burning, abnormal bleeding, or pain. Musculoskeletal: See HPI Skin: No rash, pruritis, or concerning lesions. Neurological: No syncope, seizures.  Psychological: No anxiety, depression, hallucinations, SI/HI. Endocrine: No  polydipsia, polyphagia, polyuria, or known diabetes.No increased fatigue. No palpitations/rapid heart rate. No significant/unexplained weight change. All other systems were reviewed and are otherwise negative.  Past Medical History  Diagnosis Date  . Depression   . Migraines   . Fibromyalgia   . Sleep apnea     CPAP  . S/P colonoscopy 10/29/1999    Dr Arita Miss polyps-hyperplastic  . GERD (gastroesophageal reflux disease)   . Hepatitis C 1980    symptomatic w/ jaundice, NEGATIVE HCV RNA  08/2005  . Allergy   . Tobacco dependence   . Insomnia   . S/P colonoscopy 09/12/2005    4 hyperplastic polyps  . DJD (degenerative joint disease)   . Chronic migraine without aura, with intractable migraine, so stated, without mention of status migrainosus 12/08/2012  . Lumbago 12/08/2012  . Myalgia and myositis, unspecified 12/08/2012  . Hypersomnia with sleep apnea, unspecified   . Unspecified hereditary and idiopathic peripheral neuropathy   . Overactive bladder   . Type II or unspecified type diabetes mellitus without mention of complication, not stated as uncontrolled      Past Surgical History  Procedure Laterality Date  . Complete hyst  2001  . Sinus surgery with instatrak    . Hip surgery      laproscopic left  . Abdominal hysterectomy  10/30/1999    TAH/BSO  . Pelvic laparoscopy      X3  . Foot surgery Left     Home Meds:  Outpatient Prescriptions Prior to Visit  Medication Sig Dispense Refill  . Blood Glucose Calibration (EMBRACE CONTROL) LOW SOLN     . Blood Glucose Monitoring Suppl (EMBRACE BLOOD GLUCOSE MONITOR) DEVI     . butalbital-acetaminophen-caffeine (FIORICET,  ESGIC) 50-325-40 MG per tablet     . Calcium Carbonate (CALCIUM 600 PO) Take 1 tablet by mouth daily.    . Cholecalciferol (VITAMIN D) 2000 UNITS tablet Take 2,000 Units by mouth daily.    Marland Kitchen EMBRACE BLOOD GLUCOSE TEST test strip     . esomeprazole (NEXIUM) 40 MG capsule Take 40 mg by mouth daily at 12 noon.      Marland Kitchen FLUoxetine (PROZAC) 40 MG capsule Take 40 mg by mouth Daily.     Marland Kitchen FORTEO 600 MCG/2.4ML SOLN daily.    Marland Kitchen gabapentin (NEURONTIN) 600 MG tablet Take by mouth 3 (three) times daily as needed.     Elmore Guise Devices (SIMPLE DIAGNOSTICS LANCING DEV) MISC     . methadone (DOLOPHINE) 5 MG tablet Take 5 mg by mouth 4 (four) times daily as needed.     . OnabotulinumtoxinA (BOTOX IJ) Inject as directed. q53mths for migraines    . PHARMACIST CHOICE LANCETS MISC     . SUMAtriptan (IMITREX) 6 MG/0.5ML SOLN Inject 6 mg into the skin as needed.     . cyclobenzaprine (FLEXERIL) 10 MG tablet Take 10 mg by mouth 3 (three) times daily as needed for muscle spasms.    . diazepam (VALIUM) 5 MG tablet Take 5 mg by mouth as needed (1-2 a day).      No facility-administered medications prior to visit.    Allergies:  Allergies  Allergen Reactions  . Penicillins Rash  . Sulfonamide Derivatives Rash    Social History   Social History  . Marital Status: Divorced    Spouse Name: N/A  . Number of Children: 2  . Years of Education: N/A   Occupational History  . disabled      previously RN @ [employer]   Social History Main Topics  . Smoking status: Current Every Day Smoker -- 1.00 packs/day for 40 years    Types: Cigarettes  . Smokeless tobacco: Never Used  . Alcohol Use: No     Comment: heavy etoh x 30 yrs, quit 14 yrs ago  . Drug Use: No     Comment: maijuana use  . Sexual Activity:    Partners: Male     Comment: TAH   Other Topics Concern  . Not on file   Social History Narrative    Family History  Problem Relation Age of Onset  . Aneurysm Mother   . Heart disease Mother   . Coronary artery disease Father   . Stroke Father   . Heart disease Father   . Ovarian cancer Sister   . Colon cancer Maternal Grandmother   . Diabetes Maternal Aunt     Physical Exam: Blood pressure 104/64, pulse 64, temperature 98.1 F (36.7 C), temperature source Oral, resp. rate 18, height 5' 6.5" (1.689 m),  weight 149 lb (67.586 kg)., Body mass index is 23.69 kg/(m^2). General: WF. Appears in no acute distress.  She sits hunched over, eyes droopy.(Chronic) HEENT: Normocephalic, atraumatic. Conjunctiva pink, sclera non-icteric.   Internal auditory canal clear. TMs with good cone of light and without pathology. Nasal mucosa pink. Nares are without discharge. No sinus tenderness. Oral mucosa pink.  Pharynx without exudate.   Neck: Supple. Trachea midline. No thyromegaly. Full ROM. No lymphadenopathy.No Carotid Bruits. Lungs: Clear to auscultation bilaterally without wheezes, rales, or rhonchi. Breathing is of normal effort and unlabored. Cardiovascular: RRR with S1 S2. No murmurs, rubs, or gallops. Distal pulses 2+ symmetrically. No carotid or abdominal bruits. Breast: Deferred. Per Gyn.  Abdomen: Soft, non-tender, non-distended with normoactive bowel sounds. No hepatosplenomegaly or masses. No rebound/guarding. No CVA tenderness. No hernias.  Genitourinary: Deferred. Per Gyn. Musculoskeletal: she sits hunched over, eyes droopy.(Chronic) Skin: chronic ashen color to her skin. No rashes or suspicious lesions. Neuro: A+Ox3. Moves all extremities spontaneously.  Normal gait. Psych:  Flat affect.    Assessment/Plan:  60 y.o. y/o female here for CPE 1. Visit for preventive health examination  A. Screening Labs: - CBC with Differential/Platelet - COMPLETE METABOLIC PANEL WITH GFR - Lipid panel - TSH  B. Pap:Per [place] Mammogram: - MM Digital Screening; Future  D. Lung Cancer Screening---Smoker - DG Chest 2 View; Future  E. Colorectal Cancer Screening: She says she has had 2 colonoscopies. 1st was ~10 years ago--showed polyps.   2nd one was ~ 5 years ago. She thinks was with Dr. Sydell Axon.Will refer back to him for f/u.She does not know when next colonoscopy due. - Ambulatory referral to Gastroenterology  F. Immunizations:  Influenza:  N/A Tetanus: --Given  08/03/2010 Pneumococcal:--Has had no Pneumonia Vaccine. Given smoker, needs Pneumovax 23. Pt agreeable. Give today Zostavax: Discuss at age 11   2. Need for prophylactic vaccination against Streptococcus pneumoniae (pneumococcus) - Pneumococcal polysaccharide vaccine 23-valent greater than or equal to 2yo subcutaneous/IM  3. Neck pain - DG Cervical Spine Complete; Future  4. Bilateral thoracic back pain - DG Thoracic Spine W/Swimmers; Future   Signed, Olean Ree [place], [place], [facility] 06/28/2015 1:37 PM

## 2015-06-29 ENCOUNTER — Telehealth: Payer: Self-pay | Admitting: Physician Assistant

## 2015-06-29 LAB — COMPLETE METABOLIC PANEL WITH GFR
ALBUMIN: 3.7 g/dL (ref 3.6–5.1)
ALK PHOS: 73 U/L (ref 33–130)
ALT: 10 U/L (ref 6–29)
AST: 15 U/L (ref 10–35)
BILIRUBIN TOTAL: 0.3 mg/dL (ref 0.2–1.2)
BUN: 10 mg/dL (ref 7–25)
CALCIUM: 8.9 mg/dL (ref 8.6–10.4)
CO2: 25 mmol/L (ref 20–31)
CREATININE: 0.5 mg/dL (ref 0.50–1.05)
Chloride: 103 mmol/L (ref 98–110)
GFR, Est African American: >89 mL/min (ref >=60)
GFR, Est Non African American: >89 mL/min (ref >=60)
Glucose, Bld: 91 mg/dL (ref 70–99)
POTASSIUM: 4.3 mmol/L (ref 3.5–5.3)
Sodium: 137 mmol/L (ref 135–146)
TOTAL PROTEIN: 6.9 g/dL (ref 6.1–8.1)

## 2015-06-29 LAB — LIPID PANEL
CHOLESTEROL: 154 mg/dL (ref 125–200)
HDL: 46 mg/dL (ref >=46)
LDL Cholesterol: 93 mg/dL (ref <130)
Total CHOL/HDL Ratio: 3.3 Ratio (ref <=5.0)
Triglycerides: 74 mg/dL (ref <150)
VLDL: 15 mg/dL (ref <30)

## 2015-06-29 LAB — TSH: TSH: 1.99 mIU/L

## 2015-06-29 NOTE — Telephone Encounter (Signed)
Patient called and she was told that her xrays had not abnormalities but she says she is still hurting really bad  Would like to know what to do next [phone]

## 2015-06-29 NOTE — Telephone Encounter (Signed)
Pt told needs time for this to feel better. X-rays were normal.  Can not add to her pain meds from Dr Merlene Laughter.  Needs to rest.  At this point can put heat to area 3-4 times a day.  May take 1-2 weeks to feel better.

## 2015-06-29 NOTE — Telephone Encounter (Signed)
Agree 

## 2015-07-03 ENCOUNTER — Encounter: Payer: Self-pay | Admitting: Family Medicine

## 2015-07-04 ENCOUNTER — Inpatient Hospital Stay (HOSPITAL_COMMUNITY): Admission: RE | Admit: 2015-07-04 | Payer: Commercial Managed Care - HMO | Source: Ambulatory Visit

## 2015-07-11 DIAGNOSIS — G4701 Insomnia due to medical condition: Secondary | ICD-10-CM | POA: Diagnosis not present

## 2015-07-11 DIAGNOSIS — E119 Type 2 diabetes mellitus without complications: Secondary | ICD-10-CM | POA: Diagnosis not present

## 2015-07-11 DIAGNOSIS — K219 Gastro-esophageal reflux disease without esophagitis: Secondary | ICD-10-CM | POA: Diagnosis not present

## 2015-07-11 DIAGNOSIS — M545 Low back pain: Secondary | ICD-10-CM | POA: Diagnosis not present

## 2015-07-11 DIAGNOSIS — M797 Fibromyalgia: Secondary | ICD-10-CM | POA: Diagnosis not present

## 2015-07-11 DIAGNOSIS — G43909 Migraine, unspecified, not intractable, without status migrainosus: Secondary | ICD-10-CM | POA: Diagnosis not present

## 2015-07-11 DIAGNOSIS — Z79899 Other long term (current) drug therapy: Secondary | ICD-10-CM | POA: Diagnosis not present

## 2015-07-11 DIAGNOSIS — G609 Hereditary and idiopathic neuropathy, unspecified: Secondary | ICD-10-CM | POA: Diagnosis not present

## 2015-07-11 DIAGNOSIS — G4710 Hypersomnia, unspecified: Secondary | ICD-10-CM | POA: Diagnosis not present

## 2015-07-12 ENCOUNTER — Ambulatory Visit (HOSPITAL_COMMUNITY)
Admission: RE | Admit: 2015-07-12 | Discharge: 2015-07-12 | Disposition: A | Discharge status: Home / Self Care | Payer: Commercial Managed Care - HMO | Source: Ambulatory Visit | Attending: Physician Assistant | Admitting: Physician Assistant

## 2015-07-12 DIAGNOSIS — Z1231 Encounter for screening mammogram for malignant neoplasm of breast: Secondary | ICD-10-CM | POA: Diagnosis not present

## 2015-09-12 DIAGNOSIS — M545 Low back pain: Secondary | ICD-10-CM | POA: Diagnosis not present

## 2015-09-12 DIAGNOSIS — E119 Type 2 diabetes mellitus without complications: Secondary | ICD-10-CM | POA: Diagnosis not present

## 2015-09-12 DIAGNOSIS — M797 Fibromyalgia: Secondary | ICD-10-CM | POA: Diagnosis not present

## 2015-09-12 DIAGNOSIS — G609 Hereditary and idiopathic neuropathy, unspecified: Secondary | ICD-10-CM | POA: Diagnosis not present

## 2015-09-12 DIAGNOSIS — G4710 Hypersomnia, unspecified: Secondary | ICD-10-CM | POA: Diagnosis not present

## 2015-09-12 DIAGNOSIS — M5416 Radiculopathy, lumbar region: Secondary | ICD-10-CM | POA: Diagnosis not present

## 2015-09-12 DIAGNOSIS — G43909 Migraine, unspecified, not intractable, without status migrainosus: Secondary | ICD-10-CM | POA: Diagnosis not present

## 2015-09-12 DIAGNOSIS — G4701 Insomnia due to medical condition: Secondary | ICD-10-CM | POA: Diagnosis not present

## 2015-09-12 DIAGNOSIS — K219 Gastro-esophageal reflux disease without esophagitis: Secondary | ICD-10-CM | POA: Diagnosis not present

## 2015-09-19 ENCOUNTER — Ambulatory Visit (INDEPENDENT_AMBULATORY_CARE_PROVIDER_SITE_OTHER): Payer: Commercial Managed Care - HMO | Admitting: Physician Assistant

## 2015-09-19 ENCOUNTER — Encounter: Payer: Self-pay | Admitting: Physician Assistant

## 2015-09-19 VITALS — BP 102/82 | HR 80 | Temp 98.7°F | Resp 16 | Wt 149.5 lb

## 2015-09-19 DIAGNOSIS — B9689 Other specified bacterial agents as the cause of diseases classified elsewhere: Principal | ICD-10-CM

## 2015-09-19 DIAGNOSIS — J988 Other specified respiratory disorders: Secondary | ICD-10-CM | POA: Diagnosis not present

## 2015-09-19 MED ORDER — AZITHROMYCIN 250 MG PO TABS: ORAL_TABLET | ORAL | 0 refills | Status: DC

## 2015-09-19 NOTE — Progress Notes (Signed)
Patient ID: Patricia Jones MRN: [id number], DOB: [birth year], 58 y.o. Date of Encounter: 09/19/2015, 2:39 PM    Chief Complaint:  Chief Complaint  Patient presents with  . URI    started 8 days, fever  and chills, sinus, ears taking otc cold med      HPI: 60 y.o. year old female presents with above.   Says started with sinus congestion. Says just the last couple days has started moving down into her chest as well. Fever/ chills just started last night. She has been using over-the-counter decongestants and cough medicines but symptoms persist. No sore throat.     Home Meds:   Outpatient Medications Prior to Visit  Medication Sig Dispense Refill  . amphetamine-dextroamphetamine (ADDERALL) 10 MG tablet     . baclofen (LIORESAL) 10 MG tablet Take 10 mg by mouth 3 (three) times daily as needed.     . Blood Glucose Calibration (EMBRACE CONTROL) LOW SOLN     . Blood Glucose Monitoring Suppl (EMBRACE BLOOD GLUCOSE MONITOR) DEVI     . butalbital-acetaminophen-caffeine (FIORICET, ESGIC) 50-325-40 MG per tablet     . Calcium Carbonate (CALCIUM 600 PO) Take 1 tablet by mouth daily.    . Cholecalciferol (VITAMIN D) 2000 UNITS tablet Take 2,000 Units by mouth daily.    . diazepam (VALIUM) 10 MG tablet Take 10 mg by mouth at bedtime as needed.     Rosealee Albee BLOOD GLUCOSE TEST test strip     . esomeprazole (NEXIUM) 40 MG capsule Take 40 mg by mouth daily at 12 noon.    Marland Kitchen FLUoxetine (PROZAC) 40 MG capsule Take 40 mg by mouth Daily.     Marland Kitchen gabapentin (NEURONTIN) 600 MG tablet Take by mouth 3 (three) times daily as needed.     Marland Kitchen ibuprofen (ADVIL,MOTRIN) 800 MG tablet     . Lancet Devices (SIMPLE DIAGNOSTICS LANCING DEV) MISC     . methadone (DOLOPHINE) 5 MG tablet Take 5 mg by mouth 4 (four) times daily as needed.     Marland Kitchen PHARMACIST CHOICE LANCETS MISC     . SUMAtriptan (IMITREX) 6 MG/0.5ML SOLN Inject 6 mg into the skin as needed.     . traMADol (ULTRAM) 50 MG tablet     . FORTEO 600 MCG/2.4ML  SOLN daily.    . OnabotulinumtoxinA (BOTOX IJ) Inject as directed. q27mths for migraines     No facility-administered medications prior to visit.     Allergies:  Allergies  Allergen Reactions  . Penicillins Rash  . Sulfonamide Derivatives Rash      Review of Systems: See HPI for pertinent ROS. All other ROS negative.    Physical Exam: Blood pressure 102/82, pulse 80, temperature 98.7 F (37.1 C), temperature source Oral, resp. rate 16, weight 149 lb 8 oz (67.8 kg)., Body mass index is 23.77 kg/m. General:  Appears in no acute distress. HEENT: Normocephalic, atraumatic, eyes without discharge, sclera non-icteric, nares are without discharge. Bilateral auditory canals clear, TM's are without perforation, pearly grey and translucent with reflective cone of light bilaterally. Oral cavity moist, posterior pharynx without exudate, erythema, peritonsillar abscess. No tenderness reported with percussion to frontal and maxillary sinuses bilaterally.  Neck: Supple. No thyromegaly. No lymphadenopathy. Lungs: Clear bilaterally to auscultation without wheezes, rales, or rhonchi. Breathing is unlabored. Heart: Regular rhythm. No murmurs, rubs, or gallops. Msk:  Strength and tone normal for age. Extremities/Skin: Warm and dry. Neuro: Alert and oriented X 3. Moves all extremities spontaneously. Gait is normal.  CNII-XII grossly in tact. Psych:  Responds to questions appropriately with a normal affect.     ASSESSMENT AND PLAN:  60 y.o. year old female with  1. Bacterial respiratory infection She is to take the azithromycin as directed. Can continue over-the-counter decongestants and cough medications as needed. Follow-up if symptoms do not resolve within 1 week after completion of antibiotic. - azithromycin (ZITHROMAX) 250 MG tablet; Day 1: Take 2 daily.  Days 2-5: Take 1 daily.  Dispense: 6 tablet; Refill: 0   Signed, [address] [place], [place], [facility] 09/19/2015 2:39 PM

## 2015-11-07 DIAGNOSIS — M797 Fibromyalgia: Secondary | ICD-10-CM | POA: Diagnosis not present

## 2015-11-07 DIAGNOSIS — G43909 Migraine, unspecified, not intractable, without status migrainosus: Secondary | ICD-10-CM | POA: Diagnosis not present

## 2015-11-07 DIAGNOSIS — G4710 Hypersomnia, unspecified: Secondary | ICD-10-CM | POA: Diagnosis not present

## 2015-11-07 DIAGNOSIS — K219 Gastro-esophageal reflux disease without esophagitis: Secondary | ICD-10-CM | POA: Diagnosis not present

## 2015-11-07 DIAGNOSIS — G609 Hereditary and idiopathic neuropathy, unspecified: Secondary | ICD-10-CM | POA: Diagnosis not present

## 2015-11-07 DIAGNOSIS — Z79891 Long term (current) use of opiate analgesic: Secondary | ICD-10-CM | POA: Diagnosis not present

## 2015-11-07 DIAGNOSIS — E119 Type 2 diabetes mellitus without complications: Secondary | ICD-10-CM | POA: Diagnosis not present

## 2015-11-07 DIAGNOSIS — G4701 Insomnia due to medical condition: Secondary | ICD-10-CM | POA: Diagnosis not present

## 2015-11-07 DIAGNOSIS — M545 Low back pain: Secondary | ICD-10-CM | POA: Diagnosis not present

## 2015-11-07 DIAGNOSIS — M542 Cervicalgia: Secondary | ICD-10-CM | POA: Diagnosis not present

## 2015-11-07 DIAGNOSIS — M5416 Radiculopathy, lumbar region: Secondary | ICD-10-CM | POA: Diagnosis not present

## 2016-01-11 ENCOUNTER — Telehealth: Payer: Self-pay | Admitting: Physician Assistant

## 2016-01-11 NOTE — Telephone Encounter (Signed)
Received fax from [place] back Auth# [id number] Treating Provider Dr. Merlene Laughter # of Visits 6 Start Date 01/11/2016 End Date 07/08/2016 CPT 99499 DX M53.3, G47.01, and M79.7

## 2016-01-16 DIAGNOSIS — M5416 Radiculopathy, lumbar region: Secondary | ICD-10-CM | POA: Diagnosis not present

## 2016-01-16 DIAGNOSIS — M533 Sacrococcygeal disorders, not elsewhere classified: Secondary | ICD-10-CM | POA: Diagnosis not present

## 2016-01-16 DIAGNOSIS — Z79891 Long term (current) use of opiate analgesic: Secondary | ICD-10-CM | POA: Diagnosis not present

## 2016-01-16 DIAGNOSIS — E119 Type 2 diabetes mellitus without complications: Secondary | ICD-10-CM | POA: Diagnosis not present

## 2016-01-16 DIAGNOSIS — G609 Hereditary and idiopathic neuropathy, unspecified: Secondary | ICD-10-CM | POA: Diagnosis not present

## 2016-01-16 DIAGNOSIS — K219 Gastro-esophageal reflux disease without esophagitis: Secondary | ICD-10-CM | POA: Diagnosis not present

## 2016-01-16 DIAGNOSIS — G4701 Insomnia due to medical condition: Secondary | ICD-10-CM | POA: Diagnosis not present

## 2016-01-16 DIAGNOSIS — M797 Fibromyalgia: Secondary | ICD-10-CM | POA: Diagnosis not present

## 2016-01-16 DIAGNOSIS — M545 Low back pain: Secondary | ICD-10-CM | POA: Diagnosis not present

## 2016-01-16 DIAGNOSIS — G43909 Migraine, unspecified, not intractable, without status migrainosus: Secondary | ICD-10-CM | POA: Diagnosis not present

## 2016-01-16 DIAGNOSIS — G4710 Hypersomnia, unspecified: Secondary | ICD-10-CM | POA: Diagnosis not present

## 2016-01-17 ENCOUNTER — Telehealth: Payer: Self-pay | Admitting: Family Medicine

## 2016-01-17 NOTE — Telephone Encounter (Signed)
Auth # [id number] req from Dr Dennard Schaumann for Dr Merlene Laughter approved for 6 visits Dx M53.3, G47.01 and M79.7 dates 01/11/16 - 07/08/16.

## 2016-01-24 ENCOUNTER — Ambulatory Visit (INDEPENDENT_AMBULATORY_CARE_PROVIDER_SITE_OTHER): Payer: Medicare HMO | Admitting: Physician Assistant

## 2016-01-24 ENCOUNTER — Encounter: Payer: Self-pay | Admitting: Physician Assistant

## 2016-01-24 VITALS — BP 104/72 | HR 63 | Temp 97.6°F | Resp 16 | Wt 142.8 lb

## 2016-01-24 DIAGNOSIS — J988 Other specified respiratory disorders: Secondary | ICD-10-CM | POA: Diagnosis not present

## 2016-01-24 DIAGNOSIS — B9689 Other specified bacterial agents as the cause of diseases classified elsewhere: Secondary | ICD-10-CM

## 2016-01-24 MED ORDER — AZITHROMYCIN 250 MG PO TABS: ORAL_TABLET | ORAL | 0 refills | Status: DC

## 2016-01-24 NOTE — Progress Notes (Signed)
Patient ID: DONDA DEMORET MRN: [id number], DOB: Jun 03, 1955, 61 y.o. Date of Encounter: 01/24/2016, 9:15 AM    Chief Complaint:  Chief Complaint  Patient presents with  . Cough    mucus  . Tinnitus     HPI: 61 y.o. year old female presents with above.   States that symptoms been going on well over a week. A lot of congestion in her head and nose and also chest. No significant sore throat. Has had no known fevers or chills. Been using over-the-counter cold medicines and guaifenesin.     Home Meds:   Outpatient Medications Prior to Visit  Medication Sig Dispense Refill  . amphetamine-dextroamphetamine (ADDERALL) 10 MG tablet     . baclofen (LIORESAL) 10 MG tablet Take 10 mg by mouth 3 (three) times daily as needed.     . Blood Glucose Calibration (EMBRACE CONTROL) LOW SOLN     . Blood Glucose Monitoring Suppl (EMBRACE BLOOD GLUCOSE MONITOR) DEVI     . butalbital-acetaminophen-caffeine (FIORICET, ESGIC) 50-325-40 MG per tablet     . Calcium Carbonate (CALCIUM 600 PO) Take 1 tablet by mouth daily.    . Cholecalciferol (VITAMIN D) 2000 UNITS tablet Take 2,000 Units by mouth daily.    . diazepam (VALIUM) 10 MG tablet Take 10 mg by mouth at bedtime as needed.     Rosealee Albee BLOOD GLUCOSE TEST test strip     . esomeprazole (NEXIUM) 40 MG capsule Take 40 mg by mouth daily at 12 noon.    Marland Kitchen FLUoxetine (PROZAC) 40 MG capsule Take 40 mg by mouth Daily.     Marland Kitchen FORTEO 600 MCG/2.4ML SOLN daily.    Marland Kitchen gabapentin (NEURONTIN) 600 MG tablet Take by mouth 3 (three) times daily as needed.     Marland Kitchen ibuprofen (ADVIL,MOTRIN) 800 MG tablet     . Lancet Devices (SIMPLE DIAGNOSTICS LANCING DEV) MISC     . methadone (DOLOPHINE) 5 MG tablet Take 5 mg by mouth 4 (four) times daily as needed.     . OnabotulinumtoxinA (BOTOX IJ) Inject as directed. q64mths for migraines    . PHARMACIST CHOICE LANCETS MISC     . SUMAtriptan (IMITREX) 6 MG/0.5ML SOLN Inject 6 mg into the skin as needed.     . traMADol (ULTRAM)  50 MG tablet     . azithromycin (ZITHROMAX) 250 MG tablet Day 1: Take 2 daily.  Days 2-5: Take 1 daily. (Patient not taking: Reported on 01/24/2016) 6 tablet 0   No facility-administered medications prior to visit.     Allergies:  Allergies  Allergen Reactions  . Penicillins Rash  . Sulfonamide Derivatives Rash      Review of Systems: See HPI for pertinent ROS. All other ROS negative.    Physical Exam: Blood pressure 104/72, pulse 63, temperature 97.6 F (36.4 C), temperature source Oral, resp. rate 16, weight 142 lb 12.8 oz (64.8 kg), SpO2 97 %., Body mass index is 22.7 kg/m. General:  WF. Appears in no acute distress. HEENT: Normocephalic, atraumatic, eyes without discharge, sclera non-icteric, nares are without discharge. Bilateral auditory canals clear, TM's are without perforation, pearly grey and translucent with reflective cone of light bilaterally. Oral cavity moist, posterior pharynx without exudate, erythema, peritonsillar abscess. No tenderness with percussion to frontal or maxillary sinuses bilaterally.  Neck: Supple. No thyromegaly. No lymphadenopathy. Lungs: Clear bilaterally to auscultation without wheezes, rales, or rhonchi. Breathing is unlabored. Heart: Regular rhythm. No murmurs, rubs, or gallops. Msk:  Strength and tone normal  for age. Extremities/Skin: Warm and dry.  Neuro: Alert and oriented X 3. Moves all extremities spontaneously. Gait is normal. CNII-XII grossly in tact. Psych:  Responds to questions appropriately with a normal affect.     ASSESSMENT AND PLAN:  61 y.o. year old female with  1. Bacterial respiratory infection She is to take the antibiotic as directed. Can continue over-the-counter medicines and expectorant for symptom relief. Follow-up if symptoms do not resolve within 1 week after completion of antibiotic. - azithromycin (ZITHROMAX) 250 MG tablet; Day 1: Take 2 daily. Days 2-5: Take 1 daily.  Dispense: 6 tablet; Refill:  0   Signed, [address] [place], [place], [facility] 01/24/2016 9:15 AM

## 2016-02-12 DIAGNOSIS — G609 Hereditary and idiopathic neuropathy, unspecified: Secondary | ICD-10-CM | POA: Diagnosis not present

## 2016-02-12 DIAGNOSIS — G43909 Migraine, unspecified, not intractable, without status migrainosus: Secondary | ICD-10-CM | POA: Diagnosis not present

## 2016-02-12 DIAGNOSIS — G4701 Insomnia due to medical condition: Secondary | ICD-10-CM | POA: Diagnosis not present

## 2016-02-12 DIAGNOSIS — M5416 Radiculopathy, lumbar region: Secondary | ICD-10-CM | POA: Diagnosis not present

## 2016-02-12 DIAGNOSIS — G4710 Hypersomnia, unspecified: Secondary | ICD-10-CM | POA: Diagnosis not present

## 2016-02-12 DIAGNOSIS — K219 Gastro-esophageal reflux disease without esophagitis: Secondary | ICD-10-CM | POA: Diagnosis not present

## 2016-02-12 DIAGNOSIS — M797 Fibromyalgia: Secondary | ICD-10-CM | POA: Diagnosis not present

## 2016-02-12 DIAGNOSIS — M533 Sacrococcygeal disorders, not elsewhere classified: Secondary | ICD-10-CM | POA: Diagnosis not present

## 2016-02-12 DIAGNOSIS — Z79891 Long term (current) use of opiate analgesic: Secondary | ICD-10-CM | POA: Diagnosis not present

## 2016-02-14 DIAGNOSIS — G4710 Hypersomnia, unspecified: Secondary | ICD-10-CM | POA: Diagnosis not present

## 2016-02-14 DIAGNOSIS — G43909 Migraine, unspecified, not intractable, without status migrainosus: Secondary | ICD-10-CM | POA: Diagnosis not present

## 2016-02-14 DIAGNOSIS — M797 Fibromyalgia: Secondary | ICD-10-CM | POA: Diagnosis not present

## 2016-02-14 DIAGNOSIS — G609 Hereditary and idiopathic neuropathy, unspecified: Secondary | ICD-10-CM | POA: Diagnosis not present

## 2016-02-14 DIAGNOSIS — M533 Sacrococcygeal disorders, not elsewhere classified: Secondary | ICD-10-CM | POA: Diagnosis not present

## 2016-02-14 DIAGNOSIS — M5416 Radiculopathy, lumbar region: Secondary | ICD-10-CM | POA: Diagnosis not present

## 2016-02-14 DIAGNOSIS — Z79891 Long term (current) use of opiate analgesic: Secondary | ICD-10-CM | POA: Diagnosis not present

## 2016-02-14 DIAGNOSIS — G4701 Insomnia due to medical condition: Secondary | ICD-10-CM | POA: Diagnosis not present

## 2016-02-14 DIAGNOSIS — M545 Low back pain: Secondary | ICD-10-CM | POA: Diagnosis not present

## 2016-03-12 DIAGNOSIS — M797 Fibromyalgia: Secondary | ICD-10-CM | POA: Diagnosis not present

## 2016-03-12 DIAGNOSIS — M5416 Radiculopathy, lumbar region: Secondary | ICD-10-CM | POA: Diagnosis not present

## 2016-03-12 DIAGNOSIS — M533 Sacrococcygeal disorders, not elsewhere classified: Secondary | ICD-10-CM | POA: Diagnosis not present

## 2016-03-12 DIAGNOSIS — G43909 Migraine, unspecified, not intractable, without status migrainosus: Secondary | ICD-10-CM | POA: Diagnosis not present

## 2016-03-12 DIAGNOSIS — K219 Gastro-esophageal reflux disease without esophagitis: Secondary | ICD-10-CM | POA: Diagnosis not present

## 2016-03-12 DIAGNOSIS — G609 Hereditary and idiopathic neuropathy, unspecified: Secondary | ICD-10-CM | POA: Diagnosis not present

## 2016-03-12 DIAGNOSIS — G4701 Insomnia due to medical condition: Secondary | ICD-10-CM | POA: Diagnosis not present

## 2016-03-12 DIAGNOSIS — Z79891 Long term (current) use of opiate analgesic: Secondary | ICD-10-CM | POA: Diagnosis not present

## 2016-03-12 DIAGNOSIS — G4710 Hypersomnia, unspecified: Secondary | ICD-10-CM | POA: Diagnosis not present

## 2016-04-01 ENCOUNTER — Ambulatory Visit (INDEPENDENT_AMBULATORY_CARE_PROVIDER_SITE_OTHER): Payer: Medicare HMO | Admitting: Family Medicine

## 2016-04-01 ENCOUNTER — Ambulatory Visit (HOSPITAL_COMMUNITY)
Admission: RE | Admit: 2016-04-01 | Discharge: 2016-04-01 | Disposition: A | Discharge status: Home / Self Care | Payer: Medicare HMO | Source: Ambulatory Visit | Attending: Family Medicine | Admitting: Family Medicine

## 2016-04-01 ENCOUNTER — Encounter: Payer: Self-pay | Admitting: Family Medicine

## 2016-04-01 VITALS — BP 112/62 | HR 80 | Temp 98.4°F | Resp 14 | Ht 66.5 in | Wt 134.0 lb

## 2016-04-01 DIAGNOSIS — M25552 Pain in left hip: Secondary | ICD-10-CM | POA: Diagnosis not present

## 2016-04-01 DIAGNOSIS — R51 Headache: Secondary | ICD-10-CM

## 2016-04-01 DIAGNOSIS — W19XXXA Unspecified fall, initial encounter: Secondary | ICD-10-CM | POA: Diagnosis not present

## 2016-04-01 DIAGNOSIS — Z79899 Other long term (current) drug therapy: Secondary | ICD-10-CM | POA: Diagnosis not present

## 2016-04-01 DIAGNOSIS — M25551 Pain in right hip: Secondary | ICD-10-CM | POA: Insufficient documentation

## 2016-04-01 DIAGNOSIS — G319 Degenerative disease of nervous system, unspecified: Secondary | ICD-10-CM | POA: Insufficient documentation

## 2016-04-01 DIAGNOSIS — S060X9A Concussion with loss of consciousness of unspecified duration, initial encounter: Secondary | ICD-10-CM

## 2016-04-01 DIAGNOSIS — R519 Headache, unspecified: Secondary | ICD-10-CM

## 2016-04-01 DIAGNOSIS — S79911A Unspecified injury of right hip, initial encounter: Secondary | ICD-10-CM | POA: Diagnosis not present

## 2016-04-01 DIAGNOSIS — M542 Cervicalgia: Secondary | ICD-10-CM

## 2016-04-01 DIAGNOSIS — S060X1A Concussion with loss of consciousness of 30 minutes or less, initial encounter: Secondary | ICD-10-CM

## 2016-04-01 DIAGNOSIS — S199XXA Unspecified injury of neck, initial encounter: Secondary | ICD-10-CM | POA: Diagnosis not present

## 2016-04-01 DIAGNOSIS — M50321 Other cervical disc degeneration at C4-C5 level: Secondary | ICD-10-CM | POA: Diagnosis not present

## 2016-04-01 DIAGNOSIS — J3489 Other specified disorders of nose and nasal sinuses: Secondary | ICD-10-CM | POA: Insufficient documentation

## 2016-04-01 DIAGNOSIS — M4183 Other forms of scoliosis, cervicothoracic region: Secondary | ICD-10-CM | POA: Diagnosis not present

## 2016-04-01 DIAGNOSIS — S79912A Unspecified injury of left hip, initial encounter: Secondary | ICD-10-CM | POA: Diagnosis not present

## 2016-04-01 NOTE — Patient Instructions (Signed)
Get the xrays done, we will call with results CT scan of your head F/U as needed

## 2016-04-01 NOTE — Progress Notes (Signed)
Subjective:    Patient ID: Patricia Jones, female    DOB: [birth year], 61 y.o.   MRN: [id number]  Patient presents for S/P Fall (fall x2- fell during snow last week and hit back of her head, fell off comode 4 days prior and hit side of head- states that she has HA , hip pain)   She slipped in snow last Monday, her head did hit the ground and she had a knot , few days later she got up in middle of night to urinate and fell asleep on toliet and fell forward and hit her head, has had left sided head pain and base of skull, she felt her neck getting crunched. Also complains of bilateral hip pain, states she broke her pelvis in the past She has underlying migraines and fibromyalgia Has been taking fioricet for headaches, and has imitrex shots  No change in vision since fall, had one episode of vomiting with headache but stated this was typical for her headaches   She is in pain management- takes ultram and methadone/valium ( only as needed- but has taken past nights/ gabapentin/ baclofen three times a day  Uses Adderall only to drive   Review Of Systems:  GEN- denies fatigue, fever, weight loss,weakness, recent illness HEENT- denies eye drainage, change in vision, nasal discharge, CVS- denies chest pain, palpitations RESP- denies SOB, cough, wheeze ABD- denies N/V, change in stools, abd pain GU- denies dysuria, hematuria, dribbling, incontinence MSK- +joint pain, muscle aches, injury Neuro-+ headache, +dizziness, syncope, seizure activity       Objective:    BP 112/62   Pulse 80   Temp 98.4 F (36.9 C) (Oral)   Resp 14   Ht 5' 6.5" (1.689 m)   Wt 134 lb (60.8 kg)   SpO2 98%   BMI 21.30 kg/m  GEN- NAD, alert and oriented , but very odd behavior, appeared intoxicated but she kept stating she gets this way with headaches, smelled heavily of tobacco,appears older than stated age 32- PERRL, EOMI, non injected sclera, pink conjunctiva, MMM, oropharynx clear Neck- Supple, fair  ROM, TTP mid cervical spine, neg spurlings  CVS- RRR, no murmur RESP-CTAB Neuro-CNII-XII in tact, no focal deficits but complaining headache was severe MSK- pain with IR bilat hips, NT to palpated, neg SLR EXT- No edema Pulses- Radial  2+        Assessment & Plan:      Problem List Items Addressed This Visit    None    Visit Diagnoses    Acute intractable headache, unspecified headache type    -  Primary   Very odd encounter, possible her drowsy state just due to her polypharmacy with narcotics and benzo's and muscle relaxers, but she also had injury to head x 2, 4 I am then obtain a CT scan to make sure there is no bleed. I am concerned about the significant amount of medications that she states that she is taking for her migraines fibromyalgia and chronic pain. I'll plan to contact her pain clinic to let them know how she presented today especially in the setting of no acute bleed and her brain causing any type of midline shift or cerebral edema to cause any major changes. I will go ahead and obtain the x-rays of her neck as well as her hip status post a fall. I did not prescribe her any new medications today our office as she does not prescribe any of the medications on her list.  Relevant Orders   CT Head Wo Contrast   Fall, initial encounter       Relevant Orders   CT Head Wo Contrast   DG HIPS BILAT W OR W/O PELVIS 3-4 VIEWS   DG Cervical Spine Complete   Concussion with brief LOC       Relevant Orders   CT Head Wo Contrast   Bilateral hip pain       Relevant Orders   DG HIPS BILAT W OR W/O PELVIS 3-4 VIEWS   Neck pain       Relevant Orders   DG Cervical Spine Complete   Polypharmacy          Note: This dictation was prepared with Dragon dictation along with smaller phrase technology. Any transcriptional errors that result from this process are unintentional.

## 2016-04-04 ENCOUNTER — Telehealth: Payer: Self-pay | Admitting: Family Medicine

## 2016-04-04 NOTE — Telephone Encounter (Signed)
Results previously sent.

## 2016-04-04 NOTE — Telephone Encounter (Signed)
Pt wants her results from her CT and X-ray sent to doctor [place] at [facility] neurology. I let her know that I think she has to fill out a release form for [place] to send that to them, but she didn't want to listen to what I had to say. She said that she had already told doctor [place] and she never mentioned it, and said she was told that she would send it to them as soon as she got the results. Their fax # [phone].

## 2016-04-09 DIAGNOSIS — M5416 Radiculopathy, lumbar region: Secondary | ICD-10-CM | POA: Diagnosis not present

## 2016-04-09 DIAGNOSIS — K219 Gastro-esophageal reflux disease without esophagitis: Secondary | ICD-10-CM | POA: Diagnosis not present

## 2016-04-09 DIAGNOSIS — R2689 Other abnormalities of gait and mobility: Secondary | ICD-10-CM | POA: Diagnosis not present

## 2016-04-09 DIAGNOSIS — M533 Sacrococcygeal disorders, not elsewhere classified: Secondary | ICD-10-CM | POA: Diagnosis not present

## 2016-04-09 DIAGNOSIS — G4701 Insomnia due to medical condition: Secondary | ICD-10-CM | POA: Diagnosis not present

## 2016-04-09 DIAGNOSIS — G43909 Migraine, unspecified, not intractable, without status migrainosus: Secondary | ICD-10-CM | POA: Diagnosis not present

## 2016-04-09 DIAGNOSIS — Z79891 Long term (current) use of opiate analgesic: Secondary | ICD-10-CM | POA: Diagnosis not present

## 2016-04-09 DIAGNOSIS — G4710 Hypersomnia, unspecified: Secondary | ICD-10-CM | POA: Diagnosis not present

## 2016-04-09 DIAGNOSIS — M797 Fibromyalgia: Secondary | ICD-10-CM | POA: Diagnosis not present

## 2016-05-06 DIAGNOSIS — G4710 Hypersomnia, unspecified: Secondary | ICD-10-CM | POA: Diagnosis not present

## 2016-05-06 DIAGNOSIS — G4733 Obstructive sleep apnea (adult) (pediatric): Secondary | ICD-10-CM | POA: Diagnosis not present

## 2016-05-06 DIAGNOSIS — Z79891 Long term (current) use of opiate analgesic: Secondary | ICD-10-CM | POA: Diagnosis not present

## 2016-05-06 DIAGNOSIS — M5416 Radiculopathy, lumbar region: Secondary | ICD-10-CM | POA: Diagnosis not present

## 2016-05-06 DIAGNOSIS — Z79899 Other long term (current) drug therapy: Secondary | ICD-10-CM | POA: Diagnosis not present

## 2016-05-06 DIAGNOSIS — G4701 Insomnia due to medical condition: Secondary | ICD-10-CM | POA: Diagnosis not present

## 2016-05-06 DIAGNOSIS — M797 Fibromyalgia: Secondary | ICD-10-CM | POA: Diagnosis not present

## 2016-05-06 DIAGNOSIS — M533 Sacrococcygeal disorders, not elsewhere classified: Secondary | ICD-10-CM | POA: Diagnosis not present

## 2016-05-06 DIAGNOSIS — M4123 Other idiopathic scoliosis, cervicothoracic region: Secondary | ICD-10-CM | POA: Diagnosis not present

## 2016-05-06 DIAGNOSIS — E1140 Type 2 diabetes mellitus with diabetic neuropathy, unspecified: Secondary | ICD-10-CM | POA: Diagnosis not present

## 2016-05-06 DIAGNOSIS — M545 Low back pain: Secondary | ICD-10-CM | POA: Diagnosis not present

## 2016-06-03 DIAGNOSIS — M533 Sacrococcygeal disorders, not elsewhere classified: Secondary | ICD-10-CM | POA: Diagnosis not present

## 2016-06-03 DIAGNOSIS — E1140 Type 2 diabetes mellitus with diabetic neuropathy, unspecified: Secondary | ICD-10-CM | POA: Diagnosis not present

## 2016-06-03 DIAGNOSIS — M5416 Radiculopathy, lumbar region: Secondary | ICD-10-CM | POA: Diagnosis not present

## 2016-06-03 DIAGNOSIS — M797 Fibromyalgia: Secondary | ICD-10-CM | POA: Diagnosis not present

## 2016-06-03 DIAGNOSIS — Z79891 Long term (current) use of opiate analgesic: Secondary | ICD-10-CM | POA: Diagnosis not present

## 2016-06-03 DIAGNOSIS — G4710 Hypersomnia, unspecified: Secondary | ICD-10-CM | POA: Diagnosis not present

## 2016-06-03 DIAGNOSIS — G43709 Chronic migraine without aura, not intractable, without status migrainosus: Secondary | ICD-10-CM | POA: Diagnosis not present

## 2016-06-03 DIAGNOSIS — M4123 Other idiopathic scoliosis, cervicothoracic region: Secondary | ICD-10-CM | POA: Diagnosis not present

## 2016-06-03 DIAGNOSIS — G4701 Insomnia due to medical condition: Secondary | ICD-10-CM | POA: Diagnosis not present

## 2016-06-05 DIAGNOSIS — E559 Vitamin D deficiency, unspecified: Secondary | ICD-10-CM | POA: Diagnosis not present

## 2016-06-05 DIAGNOSIS — E538 Deficiency of other specified B group vitamins: Secondary | ICD-10-CM | POA: Diagnosis not present

## 2016-06-05 DIAGNOSIS — M818 Other osteoporosis without current pathological fracture: Secondary | ICD-10-CM | POA: Diagnosis not present

## 2016-06-05 DIAGNOSIS — Z79899 Other long term (current) drug therapy: Secondary | ICD-10-CM | POA: Diagnosis not present

## 2016-06-05 DIAGNOSIS — R5383 Other fatigue: Secondary | ICD-10-CM | POA: Diagnosis not present

## 2016-06-30 ENCOUNTER — Encounter: Payer: Commercial Managed Care - HMO | Admitting: Physician Assistant

## 2016-07-01 DIAGNOSIS — M545 Low back pain: Secondary | ICD-10-CM | POA: Diagnosis not present

## 2016-07-01 DIAGNOSIS — M533 Sacrococcygeal disorders, not elsewhere classified: Secondary | ICD-10-CM | POA: Diagnosis not present

## 2016-07-01 DIAGNOSIS — E119 Type 2 diabetes mellitus without complications: Secondary | ICD-10-CM | POA: Diagnosis not present

## 2016-07-01 DIAGNOSIS — G4710 Hypersomnia, unspecified: Secondary | ICD-10-CM | POA: Diagnosis not present

## 2016-07-01 DIAGNOSIS — Z79891 Long term (current) use of opiate analgesic: Secondary | ICD-10-CM | POA: Diagnosis not present

## 2016-07-01 DIAGNOSIS — G43719 Chronic migraine without aura, intractable, without status migrainosus: Secondary | ICD-10-CM | POA: Diagnosis not present

## 2016-07-01 DIAGNOSIS — M797 Fibromyalgia: Secondary | ICD-10-CM | POA: Diagnosis not present

## 2016-07-01 DIAGNOSIS — K219 Gastro-esophageal reflux disease without esophagitis: Secondary | ICD-10-CM | POA: Diagnosis not present

## 2016-07-01 DIAGNOSIS — G4701 Insomnia due to medical condition: Secondary | ICD-10-CM | POA: Diagnosis not present

## 2016-07-01 DIAGNOSIS — G609 Hereditary and idiopathic neuropathy, unspecified: Secondary | ICD-10-CM | POA: Diagnosis not present

## 2016-08-21 ENCOUNTER — Encounter: Payer: Self-pay | Admitting: Physician Assistant

## 2016-09-02 ENCOUNTER — Other Ambulatory Visit: Payer: Self-pay | Admitting: Neurology

## 2016-09-02 DIAGNOSIS — I69393 Ataxia following cerebral infarction: Secondary | ICD-10-CM | POA: Diagnosis not present

## 2016-09-02 DIAGNOSIS — G43719 Chronic migraine without aura, intractable, without status migrainosus: Secondary | ICD-10-CM | POA: Diagnosis not present

## 2016-09-02 DIAGNOSIS — G4701 Insomnia due to medical condition: Secondary | ICD-10-CM | POA: Diagnosis not present

## 2016-09-02 DIAGNOSIS — S336XXS Sprain of sacroiliac joint, sequela: Secondary | ICD-10-CM | POA: Diagnosis not present

## 2016-09-02 DIAGNOSIS — Z79891 Long term (current) use of opiate analgesic: Secondary | ICD-10-CM | POA: Diagnosis not present

## 2016-09-02 DIAGNOSIS — R519 Headache, unspecified: Secondary | ICD-10-CM

## 2016-09-02 DIAGNOSIS — M533 Sacrococcygeal disorders, not elsewhere classified: Secondary | ICD-10-CM | POA: Diagnosis not present

## 2016-09-02 DIAGNOSIS — M797 Fibromyalgia: Secondary | ICD-10-CM | POA: Diagnosis not present

## 2016-09-02 DIAGNOSIS — G4710 Hypersomnia, unspecified: Secondary | ICD-10-CM | POA: Diagnosis not present

## 2016-09-02 DIAGNOSIS — G609 Hereditary and idiopathic neuropathy, unspecified: Secondary | ICD-10-CM | POA: Diagnosis not present

## 2016-09-02 DIAGNOSIS — R51 Headache: Principal | ICD-10-CM

## 2016-09-10 ENCOUNTER — Ambulatory Visit (HOSPITAL_COMMUNITY): Payer: Medicare HMO

## 2016-09-22 ENCOUNTER — Encounter: Payer: Self-pay | Admitting: Physician Assistant

## 2016-10-29 DIAGNOSIS — G43719 Chronic migraine without aura, intractable, without status migrainosus: Secondary | ICD-10-CM | POA: Diagnosis not present

## 2016-10-29 DIAGNOSIS — G4701 Insomnia due to medical condition: Secondary | ICD-10-CM | POA: Diagnosis not present

## 2016-10-29 DIAGNOSIS — G4710 Hypersomnia, unspecified: Secondary | ICD-10-CM | POA: Diagnosis not present

## 2016-10-29 DIAGNOSIS — M533 Sacrococcygeal disorders, not elsewhere classified: Secondary | ICD-10-CM | POA: Diagnosis not present

## 2016-11-14 ENCOUNTER — Ambulatory Visit (HOSPITAL_COMMUNITY): Payer: Medicare HMO

## 2016-11-21 ENCOUNTER — Ambulatory Visit (HOSPITAL_COMMUNITY): Payer: Medicare HMO

## 2016-11-27 ENCOUNTER — Ambulatory Visit (HOSPITAL_COMMUNITY)
Admission: RE | Admit: 2016-11-27 | Discharge: 2016-11-27 | Disposition: A | Discharge status: Home / Self Care | Payer: Medicare HMO | Source: Ambulatory Visit | Attending: Neurology | Admitting: Neurology

## 2016-11-27 DIAGNOSIS — R278 Other lack of coordination: Secondary | ICD-10-CM | POA: Insufficient documentation

## 2016-11-27 DIAGNOSIS — R42 Dizziness and giddiness: Secondary | ICD-10-CM | POA: Diagnosis not present

## 2016-11-27 DIAGNOSIS — R519 Headache, unspecified: Secondary | ICD-10-CM

## 2016-11-27 DIAGNOSIS — R51 Headache: Secondary | ICD-10-CM | POA: Insufficient documentation

## 2016-12-25 DIAGNOSIS — M542 Cervicalgia: Secondary | ICD-10-CM | POA: Diagnosis not present

## 2016-12-25 DIAGNOSIS — M545 Low back pain: Secondary | ICD-10-CM | POA: Diagnosis not present

## 2016-12-25 DIAGNOSIS — M5416 Radiculopathy, lumbar region: Secondary | ICD-10-CM | POA: Diagnosis not present

## 2016-12-25 DIAGNOSIS — M533 Sacrococcygeal disorders, not elsewhere classified: Secondary | ICD-10-CM | POA: Diagnosis not present

## 2016-12-25 DIAGNOSIS — Z79891 Long term (current) use of opiate analgesic: Secondary | ICD-10-CM | POA: Diagnosis not present

## 2016-12-25 DIAGNOSIS — G43701 Chronic migraine without aura, not intractable, with status migrainosus: Secondary | ICD-10-CM | POA: Diagnosis not present

## 2017-02-23 DIAGNOSIS — G43701 Chronic migraine without aura, not intractable, with status migrainosus: Secondary | ICD-10-CM | POA: Diagnosis not present

## 2017-02-23 DIAGNOSIS — M545 Low back pain: Secondary | ICD-10-CM | POA: Diagnosis not present

## 2017-02-23 DIAGNOSIS — M797 Fibromyalgia: Secondary | ICD-10-CM | POA: Diagnosis not present

## 2017-02-23 DIAGNOSIS — M542 Cervicalgia: Secondary | ICD-10-CM | POA: Diagnosis not present

## 2017-03-19 ENCOUNTER — Ambulatory Visit: Payer: Medicare HMO | Admitting: Physician Assistant

## 2017-04-22 DIAGNOSIS — M545 Low back pain: Secondary | ICD-10-CM | POA: Diagnosis not present

## 2017-04-22 DIAGNOSIS — Z79891 Long term (current) use of opiate analgesic: Secondary | ICD-10-CM | POA: Diagnosis not present

## 2017-04-22 DIAGNOSIS — G43701 Chronic migraine without aura, not intractable, with status migrainosus: Secondary | ICD-10-CM | POA: Diagnosis not present

## 2017-04-22 DIAGNOSIS — M542 Cervicalgia: Secondary | ICD-10-CM | POA: Diagnosis not present

## 2017-05-18 ENCOUNTER — Encounter: Payer: Self-pay | Admitting: Physician Assistant

## 2017-05-18 ENCOUNTER — Ambulatory Visit (INDEPENDENT_AMBULATORY_CARE_PROVIDER_SITE_OTHER): Payer: Medicare HMO | Admitting: Physician Assistant

## 2017-05-18 ENCOUNTER — Other Ambulatory Visit: Payer: Self-pay

## 2017-05-18 VITALS — BP 130/88 | HR 94 | Temp 98.1°F | Ht 67.0 in | Wt 144.2 lb

## 2017-05-18 DIAGNOSIS — F17200 Nicotine dependence, unspecified, uncomplicated: Secondary | ICD-10-CM

## 2017-05-18 DIAGNOSIS — Z0000 Encounter for general adult medical examination without abnormal findings: Secondary | ICD-10-CM

## 2017-05-18 DIAGNOSIS — B1920 Unspecified viral hepatitis C without hepatic coma: Secondary | ICD-10-CM

## 2017-05-18 DIAGNOSIS — R300 Dysuria: Secondary | ICD-10-CM

## 2017-05-18 DIAGNOSIS — Z86010 Personal history of colonic polyps: Secondary | ICD-10-CM | POA: Diagnosis not present

## 2017-05-18 DIAGNOSIS — E049 Nontoxic goiter, unspecified: Secondary | ICD-10-CM

## 2017-05-18 LAB — URINALYSIS, ROUTINE W REFLEX MICROSCOPIC
BILIRUBIN URINE: NEGATIVE
GLUCOSE, UA: NEGATIVE
Ketones, ur: NEGATIVE
LEUKOCYTES UA: NEGATIVE
Nitrite: NEGATIVE
PH: 7 (ref 5.0–8.0)
PROTEIN: NEGATIVE
Specific Gravity, Urine: 1.02 (ref 1.001–1.03)

## 2017-05-18 LAB — MICROSCOPIC MESSAGE

## 2017-05-18 NOTE — Progress Notes (Signed)
Patient ID: Patricia Jones MRN: [id number], DOB: [birth year], 62 y.o. Date of Encounter: 05/18/2017,   Chief Complaint: Physical (CPE)  HPI: 62 y.o. y/o female  here for CPE.    Presents today for a physical. About her visit she brings up additional issues that need follow-up. She states that she was told she had hepatitis C in her 40s.  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  Does not know if she needs follow-up evaluation of this.  She also states that she had enlarged thyroid in the past.  Needs follow-up on this.  She is not fasting today but states that she can return fasting tomorrow morning.  She reports that she has been seeing gynecologist for her GYN care in the past and that her last visit there was about 2 years ago when she will schedule follow-up with them for GYN exam. However at her last physical with me in 2017 I did go ahead and order her mammogram but she has not had another mammogram since then so is agreeable for me to go ahead and place order for follow-up mammogram.  Also notes that she continues to smoke and wants to get another chest x-ray.  No other specific concerns to address today.   Review of Systems: Consitutional: No fever, chills, fatigue, night sweats, lymphadenopathy. No significant/unexplained weight changes. Eyes: No visual changes, eye redness, or discharge. ENT/Mouth: No ear pain, sore throat, nasal drainage, or sinus pain. Cardiovascular: No chest pressure,heaviness, tightness or squeezing, even with exertion. No increased shortness of breath or dyspnea on exertion.No palpitations, edema, orthopnea, PND. Respiratory: No cough, hemoptysis, SOB, or wheezing. Gastrointestinal: No anorexia, dysphagia, reflux, pain, nausea, vomiting, hematemesis, diarrhea, constipation, BRBPR, or melena. Breast: No mass, nodules, bulging, or retraction. No skin changes or inflammation. No  nipple discharge. No lymphadenopathy. Genitourinary: No dysuria, hematuria, incontinence, vaginal discharge, pruritis, burning, abnormal bleeding, or pain. Musculoskeletal: No decreased ROM, No joint pain or swelling. No significant pain in neck, back, or extremities. Skin: No rash, pruritis, or concerning lesions. Neurological: No headache, dizziness, syncope, seizures, tremors, memory loss, coordination problems, or paresthesias. Psychological: No anxiety, depression, hallucinations, SI/HI. Endocrine: No polydipsia, polyphagia, polyuria, or known diabetes.No increased fatigue. No palpitations/rapid heart rate. No significant/unexplained weight change. All other systems were reviewed and are otherwise negative.  Past Medical History:  Diagnosis Date  . Allergy   . Chronic migraine without aura, with intractable migraine, so stated, without mention of status migrainosus 12/08/2012  . Depression   . DJD (degenerative joint disease)   . Fibromyalgia   . GERD (gastroesophageal reflux disease)   . Hepatitis C 1980   symptomatic w/ jaundice, NEGATIVE HCV RNA  08/2005  . Hypersomnia with sleep apnea, unspecified   . Insomnia   . Lumbago 12/08/2012  . Migraines   . Myalgia and myositis, unspecified 12/08/2012  . Overactive bladder   . S/P colonoscopy 10/29/1999   Dr Arita Miss polyps-hyperplastic  . S/P colonoscopy 09/12/2005   4 hyperplastic polyps  . Sleep apnea    CPAP  . Tobacco dependence   . Type II or unspecified type diabetes mellitus without mention of complication, not stated as uncontrolled   . Unspecified hereditary and idiopathic peripheral neuropathy      Past Surgical History:  Procedure Laterality Date  . ABDOMINAL HYSTERECTOMY  10/30/1999   TAH/BSO  . complete hyst  2001  . FOOT SURGERY  Left   . HIP SURGERY     laproscopic left  . PELVIC LAPAROSCOPY     X3  . SINUS SURGERY WITH INSTATRAK      Home Meds:  Outpatient Medications Prior to Visit  Medication Sig  Dispense Refill  . AIMOVIG 70 MG/ML SOAJ     . amitriptyline (ELAVIL) 10 MG tablet     . baclofen (LIORESAL) 10 MG tablet Take 10 mg by mouth 3 (three) times daily as needed.     . Blood Glucose Calibration (EMBRACE CONTROL) LOW SOLN     . Blood Glucose Monitoring Suppl (EMBRACE BLOOD GLUCOSE MONITOR) DEVI     . butalbital-acetaminophen-caffeine (FIORICET, ESGIC) 50-325-40 MG per tablet     . Calcium Carbonate (CALCIUM 600 PO) Take 1 tablet by mouth daily.    . Cholecalciferol (VITAMIN D) 2000 UNITS tablet Take 2,000 Units by mouth daily.    . diazepam (VALIUM) 10 MG tablet Take 10 mg by mouth at bedtime as needed.     Rosealee Albee BLOOD GLUCOSE TEST test strip     . esomeprazole (NEXIUM) 40 MG capsule     . FLUoxetine (PROZAC) 40 MG capsule Take 40 mg by mouth Daily.     Marland Kitchen gabapentin (NEURONTIN) 600 MG tablet Take by mouth 3 (three) times daily as needed.     Marland Kitchen ibuprofen (ADVIL,MOTRIN) 800 MG tablet     . Lancet Devices (SIMPLE DIAGNOSTICS LANCING DEV) MISC     . methadone (DOLOPHINE) 5 MG tablet Take 5 mg by mouth every 12 (twelve) hours.     Marland Kitchen PHARMACIST CHOICE LANCETS MISC     . SUMAtriptan (IMITREX) 6 MG/0.5ML SOLN Inject 6 mg into the skin as needed.     . traMADol (ULTRAM) 50 MG tablet     . amphetamine-dextroamphetamine (ADDERALL) 10 MG tablet      No facility-administered medications prior to visit.     Allergies:  Allergies  Allergen Reactions  . Sulfonamide Derivatives Rash    Social History   Socioeconomic History  . Marital status: Divorced    Spouse name: Not on file  . Number of children: 2  . Years of education: Not on file  . Highest education level: Not on file  Occupational History  . Occupation: disabled     Comment: previously Therapist, sports @ [employer]  . Financial resource strain: Not on file  . Food insecurity:    Worry: Not on file    Inability: Not on file  . Transportation needs:    Medical: Not on file    Non-medical: Not on file  Tobacco Use    . Smoking status: Current Every Day Smoker    Packs/day: 1.00    Years: 40.00    Pack years: 40.00    Types: Cigarettes  . Smokeless tobacco: Never Used  Substance and Sexual Activity  . Alcohol use: No    Comment: heavy etoh x 30 yrs, quit 14 yrs ago  . Drug use: No    Comment: maijuana use  . Sexual activity: Yes    Partners: Male    Comment: TAH  Lifestyle  . Physical activity:    Days per week: Not on file    Minutes per session: Not on file  . Stress: Not on file  Relationships  . Social connections:    Talks on phone: Not on file    Gets together: Not on file    Attends religious service: Not on file  Active member of club or organization: Not on file    Attends meetings of clubs or organizations: Not on file    Relationship status: Not on file  . Intimate partner violence:    Fear of current or ex partner: Not on file    Emotionally abused: Not on file    Physically abused: Not on file    Forced sexual activity: Not on file  Other Topics Concern  . Not on file  Social History Narrative  . Not on file    Family History  Problem Relation Age of Onset  . Aneurysm Mother   . Heart disease Mother   . Coronary artery disease Father   . Stroke Father   . Heart disease Father   . Ovarian cancer Sister   . Colon cancer Maternal Grandmother   . Diabetes Maternal Aunt     Physical Exam: Blood pressure 130/88, pulse 94, temperature 98.1 F (36.7 C), temperature source Oral, height 5\' 7"  (1.702 m), weight 65.4 kg (144 lb 4 oz), SpO2 94 %., Body mass index is 22.59 kg/m. General: Well developed, well nourished WF. Appears in no acute distress. HEENT: Normocephalic, atraumatic. Conjunctiva pink, sclera non-icteric. Pupils 2 mm constricting to 1 mm, round, regular, and equally reactive to light and accomodation. EOMI. Internal auditory canal clear. TMs with good cone of light and without pathology. Nasal mucosa pink. Nares are without discharge. No sinus tenderness.  Oral mucosa pink. Neck: Supple.  Mild thyromegaly. Full ROM. No lymphadenopathy.No Carotid Bruits. Lungs: Clear to auscultation bilaterally without wheezes, rales, or rhonchi. Breathing is of normal effort and unlabored. Cardiovascular: RRR with S1 S2. No murmurs, rubs, or gallops.  Breast: Per Gyn Abdomen: Soft, non-tender, non-distended with normoactive bowel sounds. No hepatosplenomegaly or masses. No rebound/guarding. No CVA tenderness. No hernias.  Genitourinary:  Per Gyn Musculoskeletal: Full range of motion and 5/5 strength throughout.  Skin: Warm and moist without erythema, ecchymosis, wounds, or rash. Neuro: A+Ox3. CN II-XII grossly intact. Moves all extremities spontaneously. Full sensation throughout. Normal gait.  Psych:  Responds to questions appropriately with a normal affect.   Assessment/Plan:  62 y.o. y/o female here for CPE   Encounter for preventive health examination  A. Screening Labs: She is not fasting today but will return fasting for labs. - CBC with Differential/Platelet; Future - COMPLETE METABOLIC PANEL WITH GFR; Future - Lipid panel; Future - TSH; Future - Hepatitis C antibody; Future   B. Pap: --Per Gyn  C. Screening Mammogram: Last mammogram was in 2017.  Will schedule for updated mammogram. - MM DIGITAL SCREENING BILATERAL; Future  D. DEXA/BMD:   E. Colorectal Cancer Screening: At her CPE with me 06/28/2015 she reported that she had had 2 colonoscopies.  First was approximately 10 years prior to that visit and showed polyps and she reported that she felt the second one was about 5 years prior to that visit.  At the time of that visit with me 06/28/2015 I placed referral to GI but she did not go for follow-up.  Today she is asking that I go ahead and put in another referral to GI and says that she will follow-up this time. - Ambulatory referral to Gastroenterology  F. Immunizations:  Influenza:-------------N/A Tetanus:------------ she received  Tdap 08/03/2010 Pneumococcal:----Gave Pneumovax 23 here 06/28/2015.  No further pneumonia vaccine indicated until age 31 Zostavax/Shingrx:--- Today I explained for her to check with her insurance regarding coverage and cost.  If she does want to get 1 of these vaccines  then get at pharmacy.    Hepatitis C virus infection without hepatic coma, unspecified chronicity - Hepatitis C antibody; Future - Ambulatory referral to Gastroenterology  Hx of colonic polyps - Ambulatory referral to Gastroenterology  Smoker - DG Chest 2 View; Future  Thyroid goiter Reviewed that she did have ultrasound thyroid 11/19/2012 and at that time she was seeing Dr. Michiel Sites.  Will refer back to Dr. Michiel Sites for follow-up. - Ambulatory referral to Endocrinology    Signed, Olean Ree [place], [place], [facility] 05/18/2017 3:54 PM

## 2017-05-19 ENCOUNTER — Other Ambulatory Visit: Payer: Medicare HMO

## 2017-05-19 ENCOUNTER — Encounter: Payer: Self-pay | Admitting: Gastroenterology

## 2017-05-22 ENCOUNTER — Other Ambulatory Visit: Payer: Medicare HMO

## 2017-05-22 DIAGNOSIS — B1920 Unspecified viral hepatitis C without hepatic coma: Secondary | ICD-10-CM | POA: Diagnosis not present

## 2017-05-22 DIAGNOSIS — Z0000 Encounter for general adult medical examination without abnormal findings: Secondary | ICD-10-CM | POA: Diagnosis not present

## 2017-05-22 DIAGNOSIS — Z13220 Encounter for screening for lipoid disorders: Secondary | ICD-10-CM | POA: Diagnosis not present

## 2017-05-22 DIAGNOSIS — F17200 Nicotine dependence, unspecified, uncomplicated: Secondary | ICD-10-CM

## 2017-05-27 LAB — COMPLETE METABOLIC PANEL WITH GFR
AG Ratio: 1.3 (calc) (ref 1.0–2.5)
ALKALINE PHOSPHATASE (APISO): 67 U/L (ref 33–130)
ALT: 11 U/L (ref 6–29)
AST: 18 U/L (ref 10–35)
Albumin: 3.8 g/dL (ref 3.6–5.1)
BILIRUBIN TOTAL: 0.3 mg/dL (ref 0.2–1.2)
BUN: 14 mg/dL (ref 7–25)
CALCIUM: 9.3 mg/dL (ref 8.6–10.4)
CHLORIDE: 104 mmol/L (ref 98–110)
CO2: 28 mmol/L (ref 20–32)
Creat: 0.61 mg/dL (ref 0.50–0.99)
GFR, EST NON AFRICAN AMERICAN: 99 mL/min/{1.73_m2} (ref >=60)
GFR, Est African American: 114 mL/min/{1.73_m2} (ref >=60)
GLOBULIN: 2.9 g/dL (ref 1.9–3.7)
Glucose, Bld: 96 mg/dL (ref 65–99)
POTASSIUM: 4.8 mmol/L (ref 3.5–5.3)
SODIUM: 140 mmol/L (ref 135–146)
Total Protein: 6.7 g/dL (ref 6.1–8.1)

## 2017-05-27 LAB — CBC WITH DIFFERENTIAL/PLATELET
Basophils Absolute: 50 cells/uL (ref 0–200)
Basophils Relative: 0.5 %
EOS PCT: 3.7 %
Eosinophils Absolute: 366 cells/uL (ref 15–500)
HCT: 42.2 % (ref 35.0–45.0)
Hemoglobin: 14.4 g/dL (ref 11.7–15.5)
Lymphs Abs: 2307 cells/uL (ref 850–3900)
MCH: 29.5 pg (ref 27.0–33.0)
MCHC: 34.1 g/dL (ref 32.0–36.0)
MCV: 86.5 fL (ref 80.0–100.0)
MONOS PCT: 5.3 %
MPV: 10.7 fL (ref 7.5–12.5)
NEUTROS ABS: 6653 {cells}/uL (ref 1500–7800)
Neutrophils Relative %: 67.2 %
PLATELETS: 253 10*3/uL (ref 140–400)
RBC: 4.88 10*6/uL (ref 3.80–5.10)
RDW: 13 % (ref 11.0–15.0)
TOTAL LYMPHOCYTE: 23.3 %
WBC mixed population: 525 cells/uL (ref 200–950)
WBC: 9.9 10*3/uL (ref 3.8–10.8)

## 2017-05-27 LAB — LIPID PANEL
CHOL/HDL RATIO: 3.1 (calc) (ref <5.0)
Cholesterol: 139 mg/dL (ref <200)
HDL: 45 mg/dL — AB (ref >50)
LDL CHOLESTEROL (CALC): 78 mg/dL
NON-HDL CHOLESTEROL (CALC): 94 mg/dL (ref <130)
Triglycerides: 77 mg/dL (ref <150)

## 2017-05-27 LAB — HEPATITIS C ANTIBODY
HEP C AB: REACTIVE — AB
SIGNAL TO CUT-OFF: 34 — ABNORMAL HIGH (ref <1.00)

## 2017-05-27 LAB — HCV RNA,QUANTITATIVE REAL TIME PCR
HCV Quantitative Log: <1.18 Log IU/mL
HCV RNA, PCR, QN: NOT DETECTED [IU]/mL

## 2017-05-27 LAB — TSH: TSH: 0.63 mIU/L (ref 0.40–4.50)

## 2017-06-03 ENCOUNTER — Telehealth: Payer: Self-pay

## 2017-06-03 NOTE — Telephone Encounter (Signed)
Patient called to get her lab results, When the results were first released she was to come back in for more lab work. Patient was called on 05/26/2016 and is just returning my call. She did not come in for this blood work yet but you said    Additional Hepatitis Tests added.  Shows that she had old / past infection that has been cleared by her body.  She had previous exposure but no active virus.  Does not require treatment.  Cancel Referral to Hepatitis Clinic. So does the patient still  need additional testing pls advise?

## 2017-06-03 NOTE — Telephone Encounter (Signed)
-----   Message from Orlena Sheldon, PA-C sent at 05/25/2017  9:02 AM EDT ----- Regarding Hepatitis C---- At her recent OV, she reported that she was told that--- she had hepatitis C in her 63s--- and did not know whether this needed further evaluation. At this point--Hepatitis C is positive--next--we need to do some additional labs---then will have Hepatitis Clinic review to see if needs f/u at Hepatitis Clinic. Have pt return to obtain the following labs: PT/ INR Hepatitis C Genotype Hepatitis C RNA Quantitative

## 2017-06-04 NOTE — Telephone Encounter (Signed)
She does not need to come back for any additional lab work. She does not need to go to the hepatitis clinic. While you are talking to her--- remind her to go for her chest x-ray--- that is showing up in my overdue results folder--- she had requested chest x-ray for screening for lung cancer as she is a smoker.  She needs to go for this x-ray while the order is active.

## 2017-06-05 DIAGNOSIS — M545 Low back pain: Secondary | ICD-10-CM | POA: Diagnosis not present

## 2017-06-05 DIAGNOSIS — M5416 Radiculopathy, lumbar region: Secondary | ICD-10-CM | POA: Diagnosis not present

## 2017-06-10 ENCOUNTER — Ambulatory Visit (HOSPITAL_COMMUNITY)
Admission: RE | Admit: 2017-06-10 | Discharge: 2017-06-10 | Disposition: A | Discharge status: Home / Self Care | Payer: Medicare HMO | Source: Ambulatory Visit | Attending: Physician Assistant | Admitting: Physician Assistant

## 2017-06-10 DIAGNOSIS — I700 Atherosclerosis of aorta: Secondary | ICD-10-CM | POA: Diagnosis not present

## 2017-06-10 DIAGNOSIS — M419 Scoliosis, unspecified: Secondary | ICD-10-CM | POA: Insufficient documentation

## 2017-06-10 DIAGNOSIS — J439 Emphysema, unspecified: Secondary | ICD-10-CM | POA: Insufficient documentation

## 2017-06-10 DIAGNOSIS — Z122 Encounter for screening for malignant neoplasm of respiratory organs: Secondary | ICD-10-CM | POA: Insufficient documentation

## 2017-06-10 DIAGNOSIS — F17200 Nicotine dependence, unspecified, uncomplicated: Secondary | ICD-10-CM | POA: Insufficient documentation

## 2017-06-10 NOTE — Telephone Encounter (Signed)
Patient called and I discussed her lab results with her

## 2017-06-11 ENCOUNTER — Other Ambulatory Visit (HOSPITAL_COMMUNITY): Payer: Self-pay | Admitting: Endocrinology

## 2017-06-11 DIAGNOSIS — G629 Polyneuropathy, unspecified: Secondary | ICD-10-CM | POA: Diagnosis not present

## 2017-06-11 DIAGNOSIS — E049 Nontoxic goiter, unspecified: Secondary | ICD-10-CM | POA: Diagnosis not present

## 2017-06-11 DIAGNOSIS — E1142 Type 2 diabetes mellitus with diabetic polyneuropathy: Secondary | ICD-10-CM | POA: Diagnosis not present

## 2017-06-11 DIAGNOSIS — E119 Type 2 diabetes mellitus without complications: Secondary | ICD-10-CM | POA: Diagnosis not present

## 2017-06-16 ENCOUNTER — Ambulatory Visit (HOSPITAL_COMMUNITY): Payer: Medicare HMO

## 2017-06-23 DIAGNOSIS — G43701 Chronic migraine without aura, not intractable, with status migrainosus: Secondary | ICD-10-CM | POA: Diagnosis not present

## 2017-06-23 DIAGNOSIS — Z79891 Long term (current) use of opiate analgesic: Secondary | ICD-10-CM | POA: Diagnosis not present

## 2017-06-23 DIAGNOSIS — M545 Low back pain: Secondary | ICD-10-CM | POA: Diagnosis not present

## 2017-06-23 DIAGNOSIS — M542 Cervicalgia: Secondary | ICD-10-CM | POA: Diagnosis not present

## 2017-07-15 IMAGING — DX DG CERVICAL SPINE COMPLETE 4+V
6 series · 6 of 6 positions shown · non-contrast
Comparison: 06/14/2015.

CLINICAL DATA: Fall.  Pain.

EXAM:
CERVICAL SPINE - COMPLETE 4+ VIEW

[c-spine lat]
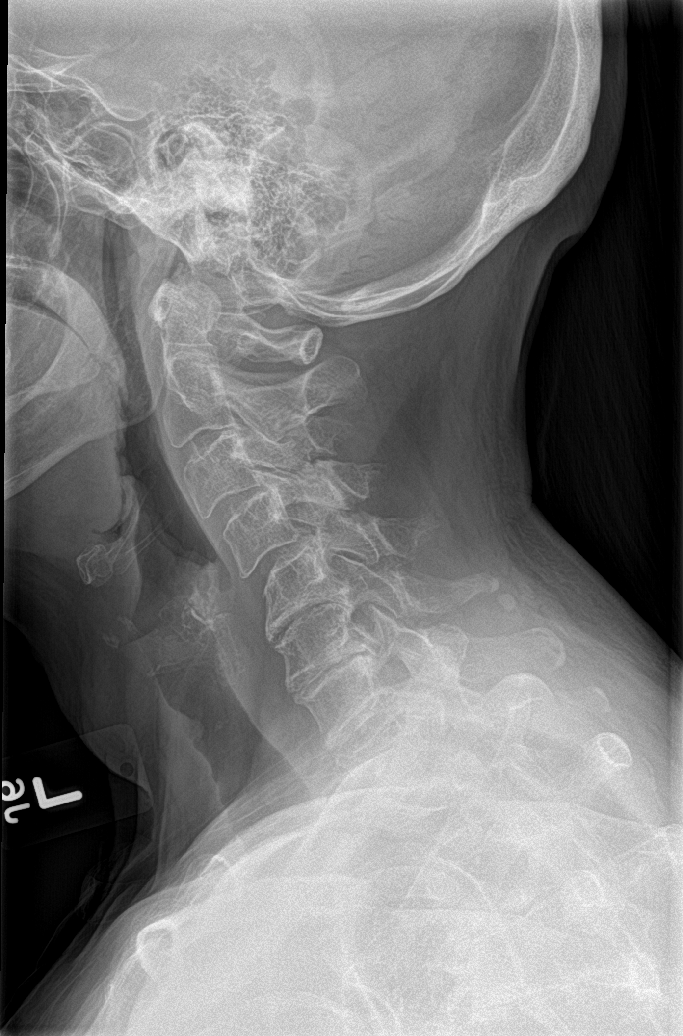

[c-spine obl (1 of 2)]
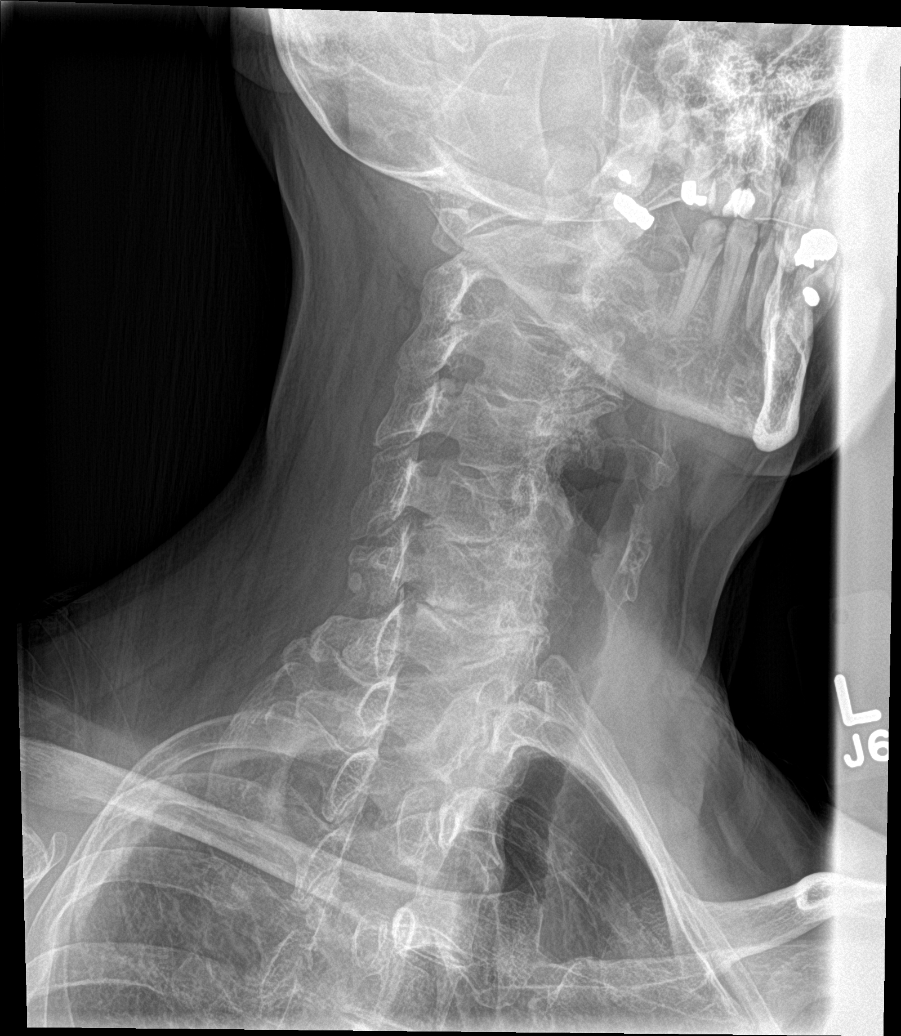

[c-spine obl (2 of 2)]
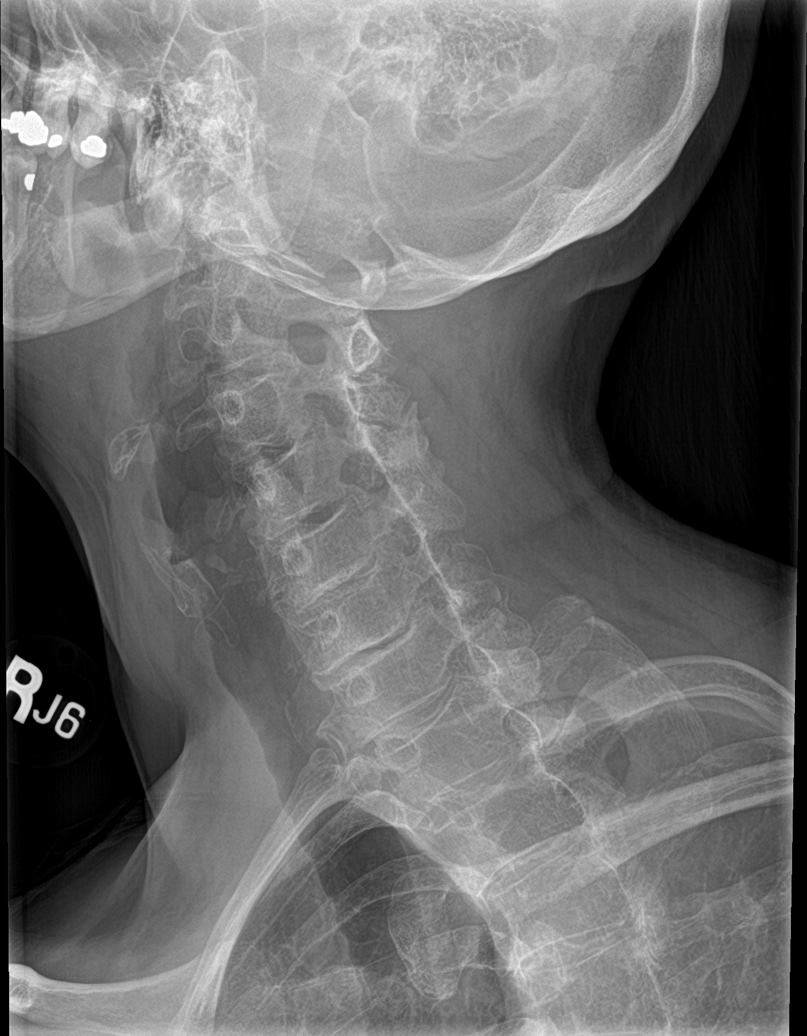

[c-spine ap]
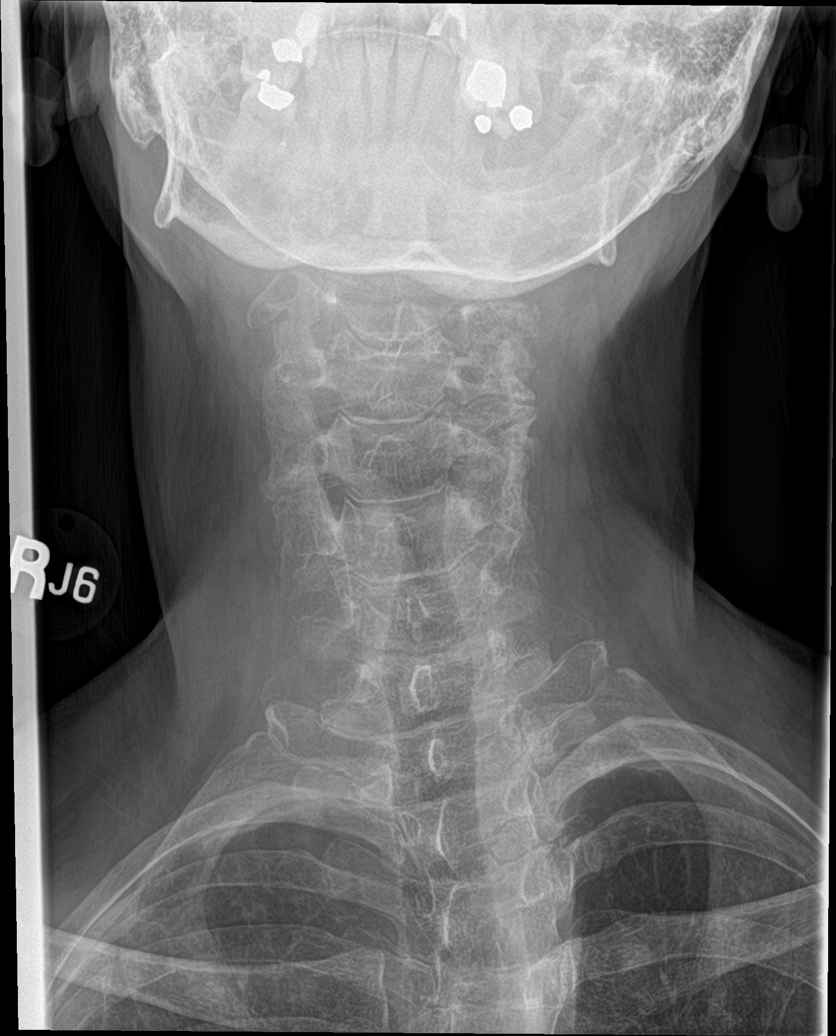

[c-spine open mouth (1 of 2)]
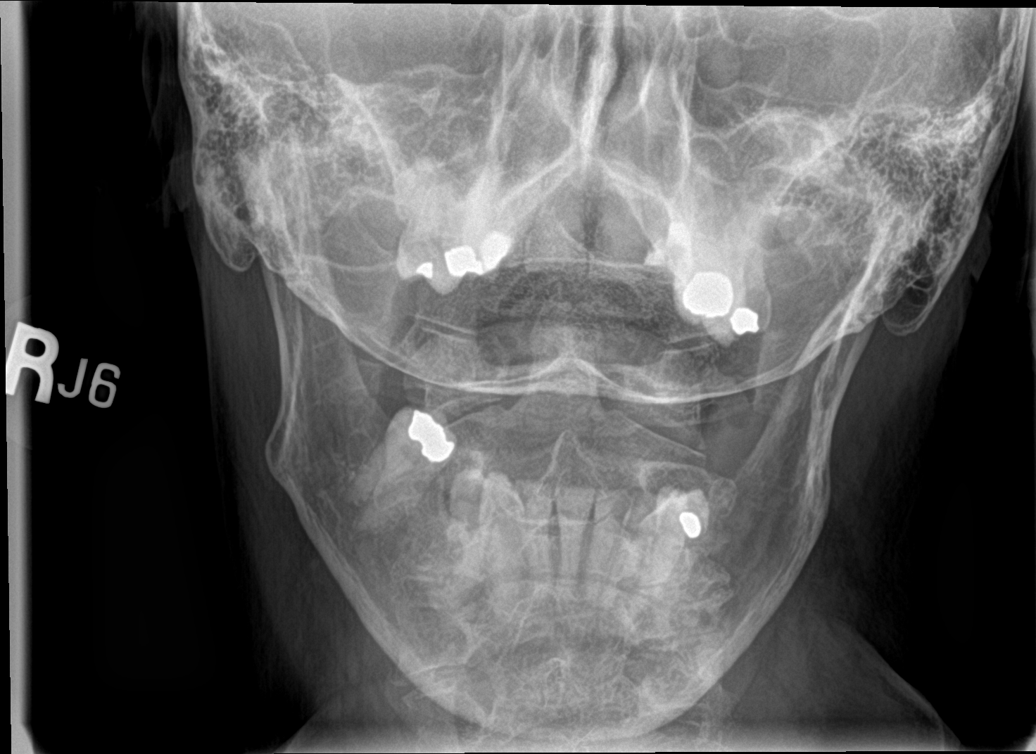

[c-spine open mouth (2 of 2)]
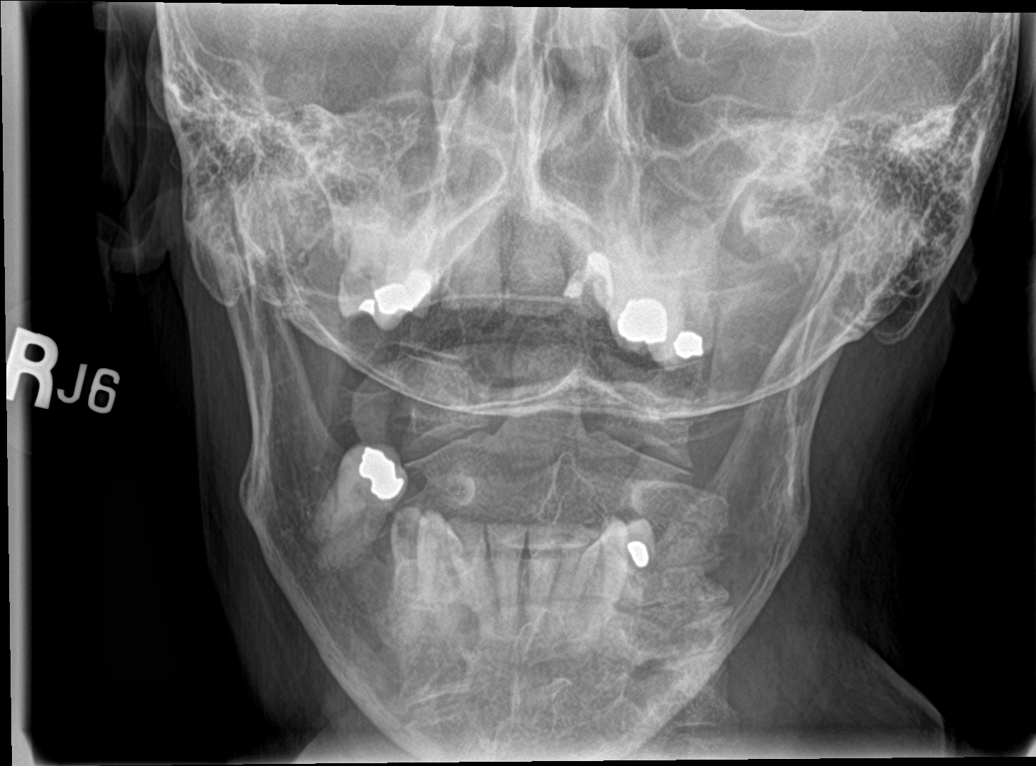

[6 of 6 positions shown; findings below may reference images not displayed]

FINDINGS: Diffuse multilevel degenerative change with prominent disc space
loss and endplate osteophyte formation. Loss of normal cervical
lordosis with stable 3-4 mm anterolisthesis C4-C5. Ligamentous
ossification. No evidence of acute fracture. Biapical pleural
thickening noted consistent with scarring.
IMPRESSION: Diffuse multilevel degenerative change with loss of normal cervical
lordosis and 3 4 mm anterolisthesis C4 on C5. No interim change from
prior exam. No acute bony abnormality .

## 2017-07-15 IMAGING — DX DG CHEST 2V
2 series · 2 of 2 positions shown · non-contrast
Comparison: 10/19/2012 .  12/11/2010.

CLINICAL DATA: Cough.

EXAM:
CHEST  2 VIEW

[chest pa]
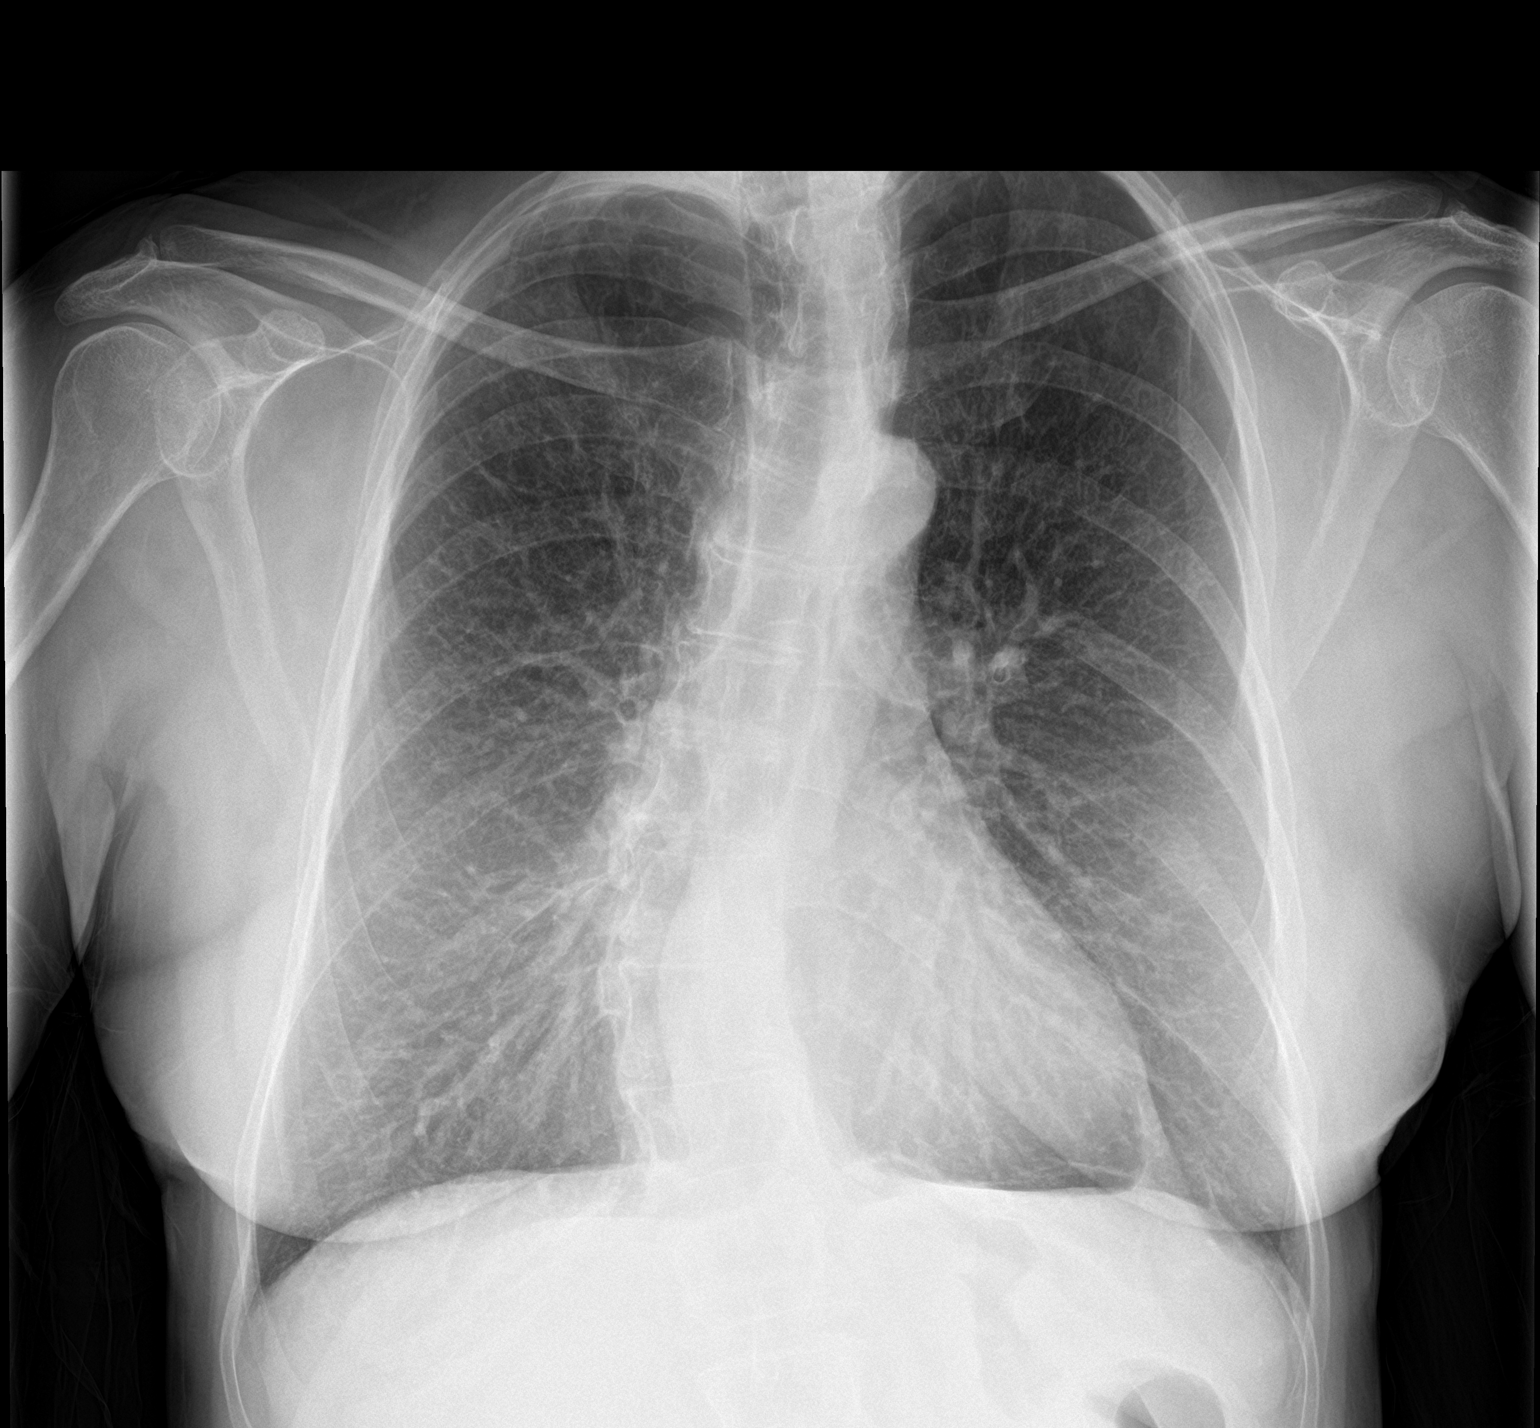

[chest lat]
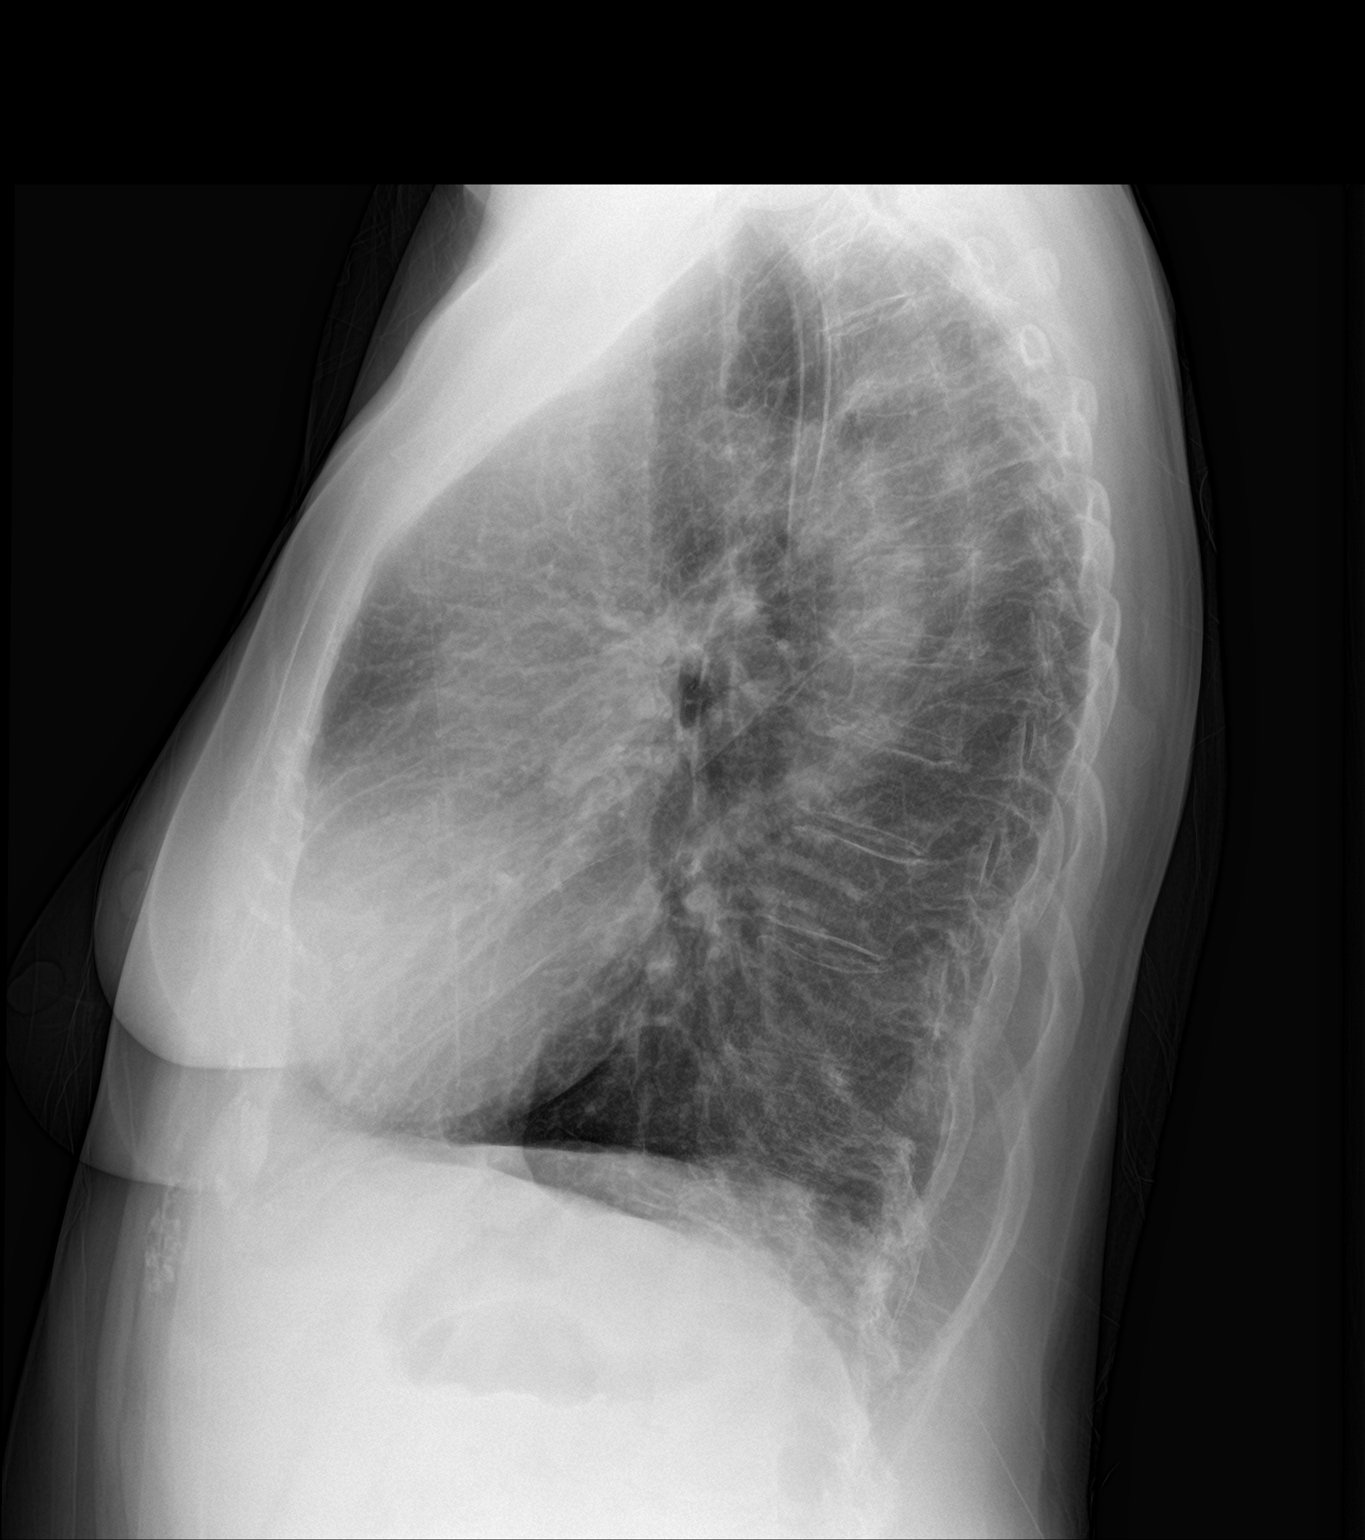

[2 of 2 positions shown; findings below may reference images not displayed]

FINDINGS: Mediastinum and hilar structures are normal. Heart size stable. Left
base pleural parenchymal thickening again noted consistent with
scarring. Bilateral from interstitial prominence noted. These
changes may be secondary to chronic interstitial lung disease
however an active component of interstitial disease including
pneumonitis cannot be excluded . No pleural effusion or
pneumothorax. Thoracic spine scoliosis.
IMPRESSION: Bilateral interstitial prominence. A component of these changes are
most likely related to chronic interstitial lung disease as noted on
prior exams. Active interstitial process including pneumonitis
cannot be excluded .

## 2017-08-11 ENCOUNTER — Ambulatory Visit: Payer: Medicare HMO | Admitting: Gastroenterology

## 2017-08-11 ENCOUNTER — Other Ambulatory Visit: Payer: Self-pay

## 2017-08-11 ENCOUNTER — Encounter: Payer: Self-pay | Admitting: Gastroenterology

## 2017-08-11 ENCOUNTER — Telehealth: Payer: Self-pay

## 2017-08-11 VITALS — BP 112/74 | HR 97 | Temp 97.4°F | Ht 67.5 in | Wt 138.8 lb

## 2017-08-11 DIAGNOSIS — R1310 Dysphagia, unspecified: Secondary | ICD-10-CM

## 2017-08-11 DIAGNOSIS — Z860100 Personal history of colon polyps, unspecified: Secondary | ICD-10-CM

## 2017-08-11 DIAGNOSIS — R1011 Right upper quadrant pain: Secondary | ICD-10-CM

## 2017-08-11 DIAGNOSIS — R1319 Other dysphagia: Secondary | ICD-10-CM | POA: Insufficient documentation

## 2017-08-11 DIAGNOSIS — R198 Other specified symptoms and signs involving the digestive system and abdomen: Secondary | ICD-10-CM | POA: Diagnosis not present

## 2017-08-11 MED ORDER — PEG 3350-KCL-NA BICARB-NACL 420 G PO SOLR: 4000.0000 mL | ORAL | 0 refills | Status: DC

## 2017-08-11 NOTE — Patient Instructions (Signed)
Colonoscopy and upper endoscopy with Dr. Oneida Alar. See separate instructions.

## 2017-08-11 NOTE — Telephone Encounter (Signed)
Called and informed pt of pre-op appt 10/14/17 at 11:00am. Letter mailed.

## 2017-08-11 NOTE — Progress Notes (Addendum)
Primary Care Physician:  Orlena Sheldon, PA-C  Primary Gastroenterologist:  Barney Drain, MD  REVIEWED-NO ADDITIONAL RECOMMENDATIONS.  Chief Complaint  Patient presents with  . Dysphagia  . Hepatitis C    history    HPI:  Patricia Jones is a 62 y.o. female here at the request of Dena Billet for consideration of colonoscopy.  Patient seen previously by [place] in 2012 for GERD and we were trying to schedule her for EGD but were not able to get in touch with her.  She had previous colonoscopy in 2001 and 2002, multiple hyperplastic polyps.  Family history of colon cancer in a maternal great-grandmother.  Patient with somewhat more sluggish bowel movements.  Has a bowel movement every other day to daily but feels incomplete.  Goes back several trips to complete her bowel movement every morning.  Rare constipation. No melena, brbpr.   No longer on Nexium.  Takes Tums as needed for occasional reflux.  Notes significant tooth decay over the past couple of years.  Complains of dysphagia to pills.  Pills get stuck in her upper esophagus.  Some solid food dysphagia.  No problems swallowing liquids.  Trying to wean off of methadone at the request of pain management.  Plans to start Subutex.  Chronic pain from fibromyalgia, pelvic fractures, back pain.  Remote previous alcohol abuse.  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  Subsequent testing has revealed positive hepatitis C antibody with negative HCVRNA.  Likely had hepatitis C but was able to clear it on her own.  Current Outpatient Medications  Medication Sig Dispense Refill  . AIMOVIG 70 MG/ML SOAJ     . amitriptyline (ELAVIL) 10 MG tablet Take 20 mg by mouth at bedtime.     . baclofen (LIORESAL) 10 MG tablet Take 10 mg by mouth 3 (three) times daily as needed.     . butalbital-acetaminophen-caffeine (FIORICET, ESGIC) 50-325-40 MG per tablet Take by mouth as needed.     . Calcium Carbonate (CALCIUM 600 PO)  Take 1 tablet by mouth daily.    . Cholecalciferol (VITAMIN D) 2000 UNITS tablet Take 2,000 Units by mouth daily.    . diazepam (VALIUM) 10 MG tablet Take 10 mg by mouth at bedtime as needed.     Marland Kitchen FLUoxetine (PROZAC) 40 MG capsule Take 40 mg by mouth Daily.     Marland Kitchen gabapentin (NEURONTIN) 600 MG tablet Take by mouth 3 (three) times daily as needed.     . methadone (DOLOPHINE) 5 MG tablet Take 5 mg by mouth every other day.     . SUMAtriptan (IMITREX) 6 MG/0.5ML SOLN Inject 6 mg into the skin as needed.     . traMADol (ULTRAM) 50 MG tablet Take 50 mg by mouth as needed.     Rosealee Albee BLOOD GLUCOSE TEST test strip     . Lancet Devices (SIMPLE DIAGNOSTICS LANCING DEV) MISC     . PHARMACIST CHOICE LANCETS MISC      No current facility-administered medications for this visit.     Allergies as of 08/11/2017 - Review Complete 08/11/2017  Allergen Reaction Noted  . Sulfonamide derivatives Rash 05/10/2008    Past Medical History:  Diagnosis Date  . Allergy   . Chronic migraine without aura, with intractable migraine, so stated, without mention of status migrainosus 12/08/2012  . Depression   . DJD (degenerative joint disease)   . Fibromyalgia   . GERD (gastroesophageal reflux disease)   .  Hepatitis C 1980   symptomatic w/ jaundice, NEGATIVE HCV RNA  08/2005, positive hepatitis C antibody but negative HCVRNA in 2019  . Hypersomnia with sleep apnea, unspecified   . Insomnia   . Lumbago 12/08/2012  . Migraines   . Myalgia and myositis, unspecified 12/08/2012  . Overactive bladder   . S/P colonoscopy 10/29/1999   Dr Arita Miss polyps-hyperplastic  . S/P colonoscopy 09/12/2005   4 hyperplastic polyps  . Sleep apnea    CPAP  . Tobacco dependence   . Type II or unspecified type diabetes mellitus without mention of complication, not stated as uncontrolled   . Unspecified hereditary and idiopathic peripheral neuropathy     Past Surgical History:  Procedure Laterality Date  . ABDOMINAL  HYSTERECTOMY  10/30/1999   TAH/BSO  . COLONOSCOPY     2007, 2001, hyperplastic polyps  . complete hyst  2001  . EXPLORATORY LAPAROTOMY     Ruptured ovarian cyst with hemorrhage in her 69s  . FOOT SURGERY Left   . HIP SURGERY     laproscopic left  . PELVIC LAPAROSCOPY     Multiple for ovarian cysts and endometriosis, infertility issues  . SINUS SURGERY WITH INSTATRAK      Family History  Problem Relation Age of Onset  . Aneurysm Mother   . Heart disease Mother   . Coronary artery disease Father   . Stroke Father   . Heart disease Father   . Cervical cancer Sister   . Ovarian cancer Sister   . Colon cancer Maternal Grandmother        greater than age 76. was her great grandmother  . Diabetes Maternal Aunt     Social History   Socioeconomic History  . Marital status: Divorced    Spouse name: Not on file  . Number of children: 2  . Years of education: Not on file  . Highest education level: Not on file  Occupational History  . 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 Financial resource strain: Not on file  . Food insecurity:    Worry: Not on file    Inability: Not on file  . Transportation needs:    Medical: Not on file    Non-medical: Not on file  Tobacco Use  . Smoking status: Current Every Day Smoker    Packs/day: 1.00    Years: 40.00    Pack years: 40.00    Types: Cigarettes  . Smokeless tobacco: Never Used  Substance and Sexual Activity  . Alcohol use: No    Comment: heavy etoh x 30 yrs, quit 14 yrs ago  . Drug use: Yes    Types: Marijuana    Comment: maijuana use-occ  . Sexual activity: Yes    Partners: Male    Comment: TAH  Lifestyle  . Physical activity:    Days per week: Not on file    Minutes per session: Not on file  . Stress: Not on file  Relationships  . Social connections:    Talks on phone: Not on file    Gets together: Not on file    Attends religious service: Not on file    Active member of club or  organization: Not on file    Attends meetings of clubs or organizations: Not on file    Relationship status: Not on file  . Intimate partner violence:    Fear of current or ex partner: Not on file  Emotionally abused: Not on file    Physically abused: Not on file    Forced sexual activity: Not on file  Other Topics Concern  . Not on file  Social History Narrative  . Not on file      ROS:  General: Negative for anorexia, unintentional weight loss, fever, chills, fatigue, weakness. Eyes: Negative for vision changes.  ENT: Negative for hoarseness,   nasal congestion. See hpi CV: Negative for chest pain, angina, palpitations, dyspnea on exertion, peripheral edema.  Respiratory: Negative for dyspnea at rest, dyspnea on exertion, cough, sputum, wheezing.  GI: See history of present illness. GU:  Negative for dysuria, hematuria, urinary incontinence, urinary frequency, nocturnal urination.  MS: Chronic diffuse pain, back pain Derm: Negative for rash or itching.  Neuro: Negative for weakness, abnormal sensation, seizure, frequent headaches, memory loss, confusion.  Psych: Negative for anxiety, depression, suicidal ideation, hallucinations.  Endo: Negative for unusual weight change.  Heme: Negative for bruising or bleeding. Allergy: Negative for rash or hives.    Physical Examination:  BP 112/74   Pulse 97   Temp (!) 97.4 F (36.3 C) (Oral)   Ht 5' 7.5" (1.715 m)   Wt 138 lb 12.8 oz (63 kg)   BMI 21.42 kg/m    General: Well-nourished, well-developed in no acute distress.  Head: Normocephalic, atraumatic.   Eyes: Conjunctiva pink, no icterus. Mouth: Oropharyngeal mucosa moist and pink , no lesions erythema or exudate. Neck: Supple without thyromegaly, masses, or lymphadenopathy.  Lungs: Clear to auscultation bilaterally.  Heart: Regular rate and rhythm, no murmurs rubs or gallops.  Abdomen: Bowel sounds are normal, nontender, nondistended, no hepatosplenomegaly or masses,  no abdominal bruits or    hernia , no rebound or guarding.   Rectal: Deferred Extremities: No lower extremity edema. No clubbing or deformities.  Neuro: Alert and oriented x 4 , grossly normal neurologically.  Skin: Warm and dry, no rash or jaundice.   Psych: Alert and cooperative, normal mood and affect.  Labs: Lab Results  Component Value Date   WBC 9.9 05/22/2017   HGB 14.4 05/22/2017   HCT 42.2 05/22/2017   MCV 86.5 05/22/2017   PLT 253 05/22/2017   Lab Results  Component Value Date   CREATININE 0.61 05/22/2017   BUN 14 05/22/2017   NA 140 05/22/2017   K 4.8 05/22/2017   CL 104 05/22/2017   CO2 28 05/22/2017   Lab Results  Component Value Date   ALT 11 05/22/2017   AST 18 05/22/2017   ALKPHOS 67 05/22/2017   BILITOT 0.3 05/22/2017     Imaging Studies: No results found.

## 2017-08-12 ENCOUNTER — Encounter: Payer: Self-pay | Admitting: Gastroenterology

## 2017-08-12 NOTE — Assessment & Plan Note (Signed)
62 year old female with history of solid food/pill dysphagia.  Prior history of significant GERD but currently controlled on Tums.  Plan for EGD with esophageal dilation in the near future, deep sedation given polypharmacy.  I have discussed the risks, alternatives, benefits with regards to but not limited to the risk of reaction to medication, bleeding, infection, perforation and the patient is agreeable to proceed. Written consent to be obtained.

## 2017-08-12 NOTE — Assessment & Plan Note (Signed)
62 year old female with history of colon polyps (hyperplastic), family history of colon cancer and a great-grandmother who presents with change in bowel habits.  Recommend colonoscopy with deep sedation (given polypharmacy) in the near future.  I have discussed the risks, alternatives, benefits with regards to but not limited to the risk of reaction to medication, bleeding, infection, perforation and the patient is agreeable to proceed. Written consent to be obtained.

## 2017-08-12 NOTE — Assessment & Plan Note (Signed)
Patient mentioned history of cholelithiasis seen on prior imaging, occasional right upper quadrant pain which is been self-limiting.  Discussed signs and symptoms of possible issues with gallbladder/gallstones.  Continue to monitor for now, patient not interested in pursuing surgery anyway.  Upper endoscopy as scheduled for dysphagia, evaluate for any other etiology of right upper quadrant pain at that same time.

## 2017-08-12 NOTE — Progress Notes (Signed)
cc'ed to pcp °

## 2017-08-17 NOTE — Patient Instructions (Signed)
Called [employer], no PA needed for TCS/EGD/DIL. Ref# [id number].

## 2017-08-18 DIAGNOSIS — M542 Cervicalgia: Secondary | ICD-10-CM | POA: Diagnosis not present

## 2017-08-18 DIAGNOSIS — M545 Low back pain: Secondary | ICD-10-CM | POA: Diagnosis not present

## 2017-08-18 DIAGNOSIS — Z79891 Long term (current) use of opiate analgesic: Secondary | ICD-10-CM | POA: Diagnosis not present

## 2017-08-18 DIAGNOSIS — G43701 Chronic migraine without aura, not intractable, with status migrainosus: Secondary | ICD-10-CM | POA: Diagnosis not present

## 2017-09-16 DIAGNOSIS — Z79891 Long term (current) use of opiate analgesic: Secondary | ICD-10-CM | POA: Diagnosis not present

## 2017-09-16 DIAGNOSIS — G43701 Chronic migraine without aura, not intractable, with status migrainosus: Secondary | ICD-10-CM | POA: Diagnosis not present

## 2017-09-16 DIAGNOSIS — M545 Low back pain: Secondary | ICD-10-CM | POA: Diagnosis not present

## 2017-09-16 DIAGNOSIS — M542 Cervicalgia: Secondary | ICD-10-CM | POA: Diagnosis not present

## 2017-10-07 ENCOUNTER — Ambulatory Visit (INDEPENDENT_AMBULATORY_CARE_PROVIDER_SITE_OTHER): Payer: Medicare HMO | Admitting: Physician Assistant

## 2017-10-07 ENCOUNTER — Encounter: Payer: Self-pay | Admitting: Physician Assistant

## 2017-10-07 VITALS — BP 100/62 | HR 73 | Temp 98.1°F | Resp 16 | Ht 67.5 in | Wt 142.4 lb

## 2017-10-07 DIAGNOSIS — J988 Other specified respiratory disorders: Secondary | ICD-10-CM

## 2017-10-07 DIAGNOSIS — R8290 Unspecified abnormal findings in urine: Secondary | ICD-10-CM | POA: Diagnosis not present

## 2017-10-07 DIAGNOSIS — B9689 Other specified bacterial agents as the cause of diseases classified elsewhere: Secondary | ICD-10-CM | POA: Diagnosis not present

## 2017-10-07 LAB — URINALYSIS, ROUTINE W REFLEX MICROSCOPIC
BACTERIA UA: NONE SEEN /HPF
BILIRUBIN URINE: NEGATIVE
GLUCOSE, UA: NEGATIVE
Ketones, ur: NEGATIVE
Leukocytes, UA: NEGATIVE
Nitrite: NEGATIVE
PH: 5.5 (ref 5.0–8.0)
Protein, ur: NEGATIVE
Specific Gravity, Urine: 1.02 (ref 1.001–1.03)
WBC, UA: NONE SEEN /HPF (ref 0–5)

## 2017-10-07 LAB — MICROSCOPIC MESSAGE

## 2017-10-07 MED ORDER — AZITHROMYCIN 250 MG PO TABS: ORAL_TABLET | ORAL | 0 refills | Status: DC

## 2017-10-07 NOTE — Progress Notes (Signed)
Patient ID: Patricia Jones MRN: [id number], DOB: 19-Feb-1955, 62 y.o. Date of Encounter: 10/07/2017, 12:48 PM    Chief Complaint:  Chief Complaint  Patient presents with  . chest congestion    symptoms for 2 weeks   . urine odor     HPI: 62 y.o. year old female presents with above.   She reports that she has been having mucus from her nose and coughing up thick dark phlegm both.  States that this is been going on for couple weeks.  Has been using over-the-counter medicines and that will make her feel little better for an hour or 2 but then symptoms recur and are not resolving.  No significant amount of sore throat.  No fevers or chills.   Also states that she has been having to urinate frequently so left a urine to check for UTI as well.  She has not been having any suprapubic pain or pain with urination.  No fevers or chills.    Home Meds:   Outpatient Medications Prior to Visit  Medication Sig Dispense Refill  . AIMOVIG 70 MG/ML SOAJ Inject 70 mg into the skin every 30 (thirty) days.     Marland Kitchen amitriptyline (ELAVIL) 10 MG tablet Take 20 mg by mouth at bedtime.     . baclofen (LIORESAL) 10 MG tablet Take 10 mg by mouth 3 (three) times daily as needed for muscle spasms.     . buprenorphine (SUBUTEX) 2 MG SUBL SL tablet Place 2-4 mg under the tongue daily as needed (for pain).     . butalbital-acetaminophen-caffeine (FIORICET, ESGIC) 50-325-40 MG per tablet Take 1-2 tablets by mouth every 4 (four) hours as needed for headache or migraine.     . Calcium Carbonate-Vitamin D (SM CALCIUM 500/VITAMIN D3 PO) Take 1 tablet by mouth daily.    . Cholecalciferol (VITAMIN D) 2000 UNITS tablet Take 2,000 Units by mouth daily.    . diazepam (VALIUM) 10 MG tablet Take 10 mg by mouth at bedtime as needed for anxiety.     Marland Kitchen FLUoxetine (PROZAC) 40 MG capsule Take 40 mg by mouth at bedtime.     . gabapentin (NEURONTIN) 600 MG tablet Take 600 mg by mouth 3 (three) times daily as needed (for pain).       . polyethylene glycol-electrolytes (TRILYTE) 420 g solution Take 4,000 mLs by mouth as directed. 4000 mL 0  . SUMAtriptan (IMITREX) 6 MG/0.5ML SOLN Inject 6 mg into the skin every 2 (two) hours as needed for migraine or headache.     . traMADol (ULTRAM) 50 MG tablet Take 50-100 mg by mouth every 6 (six) hours as needed for moderate pain.      No facility-administered medications prior to visit.     Allergies:  Allergies  Allergen Reactions  . Sulfonamide Derivatives Rash      Review of Systems: See HPI for pertinent ROS. All other ROS negative.    Physical Exam: Blood pressure 100/62, pulse 73, temperature 98.1 F (36.7 C), temperature source Oral, resp. rate 16, height 5' 7.5" (1.715 m), weight 64.6 kg, SpO2 93 %., Body mass index is 21.97 kg/m. General:  62 y.o. Appears in no acute distress. HEENT: Normocephalic, atraumatic, eyes without discharge, sclera non-icteric, nares are without discharge. Bilateral auditory canals clear, TM's are without perforation, pearly grey and translucent with reflective cone of light bilaterally. Oral cavity moist, posterior pharynx without exudate, erythema.  Neck: Supple. No thyromegaly. No lymphadenopathy. Lungs: Clear bilaterally to auscultation without wheezes,  rales, or rhonchi. Breathing is unlabored. Heart: Regular rhythm. No murmurs, rubs, or gallops. Msk:  Strength and tone normal for age. Extremities/Skin: Warm and dry. Neuro: Alert and oriented X 3. Moves all extremities spontaneously. Gait is normal. CNII-XII grossly in tact. Psych:  Responds to questions appropriately with a normal affect.     ASSESSMENT AND PLAN:  62 y.o. year old female with   1. Bacterial respiratory infection She is to take antibiotic as directed.  Follow-up if symptoms do not resolve within 1 week after completion of antibiotic. - azithromycin (ZITHROMAX) 250 MG tablet; Day 1: Take 2 daily. Days 2 -5: Take 1 daily.  Dispense: 6 tablet; Refill: 0  2. Abnormal  urine odor Urinalysis shows some blood but otherwise is negative.  I considered whether she needs referral to urology to evaluate the blood.  However lab technician states that on microscopic exam she marked "3 -10" but it was "closer to 3 per microscopic field----not much at all" so think we can wait and monitor rather than refer to urology at this time. - Urinalysis, Routine w reflex microscopic   Signed, [address] [place], [place], [facility] 10/07/2017 12:48 PM

## 2017-10-12 NOTE — Patient Instructions (Signed)
Patricia Jones  10/12/2017     @PREFPERIOPPHARMACY @   Your procedure is scheduled on  10/20/2017  Report to [facility] at  915  A.M.  Call this number if you have problems the morning of surgery:  [phone]   Remember:  Do not eat or drink after midnight.  You may drink clear liquids until (follow the instructions given to you) .  Clear liquids allowed are:                    Water, Juice (non-citric and without pulp), Carbonated beverages, Clear Tea, Black Coffee only, Plain Jell-O only, Gatorade and Plain Popsicles only    Take these medicines the morning of surgery with A SIP OF WATER  Subutex, fioricet ( if needed), gabapentin, ultram ( if needed).    Do not wear jewelry, make-up or nail polish.  Do not wear lotions, powders, or perfumes, or deodorant.  Do not shave 48 hours prior to surgery.  Men may shave face and neck.  Do not bring valuables to the hospital.  [facility] is not responsible for any belongings or valuables.  Contacts, dentures or bridgework may not be worn into surgery.  Leave your suitcase in the car.  After surgery it may be brought to your room.  For patients admitted to the hospital, discharge time will be determined by your treatment team.  Patients discharged the day of surgery will not be allowed to drive home.   Name and phone number of your driver:   family Special instructions:  Follow the diet and prep instructions given to you by Dr Nona Dell office.  Please read over the following fact sheets that you were given. Anesthesia Post-op Instructions and Care and Recovery After Surgery       Esophagogastroduodenoscopy Esophagogastroduodenoscopy (EGD) is a procedure to examine the lining of the esophagus, stomach, and first part of the small intestine (duodenum). This procedure is done to check for problems such as inflammation, bleeding, ulcers, or growths. During this procedure, a long, flexible, lighted tube with  a camera attached (endoscope) is inserted down the throat. Tell a health care provider about:  Any allergies you have.  All medicines you are taking, including vitamins, herbs, eye drops, creams, and over-the-counter medicines.  Any problems you or family members have had with anesthetic medicines.  Any blood disorders you have.  Any surgeries you have had.  Any medical conditions you have.  Whether you are pregnant or may be pregnant. What are the risks? Generally, this is a safe procedure. However, problems may occur, including:  Infection.  Bleeding.  A tear (perforation) in the esophagus, stomach, or duodenum.  Trouble breathing.  Excessive sweating.  Spasms of the larynx.  A slowed heartbeat.  Low blood pressure.  What happens before the procedure?  Follow instructions from your health care provider about eating or drinking restrictions.  Ask your health care provider about: ? Changing or stopping your regular medicines. This is especially important if you are taking diabetes medicines or blood thinners. ? Taking medicines such as aspirin and ibuprofen. These medicines can thin your blood. Do not take these medicines before your procedure if your health care provider instructs you not to.  Plan to have someone take you home after the procedure.  If you wear dentures, be ready to remove them before the procedure. What happens during the procedure?  To reduce your risk of infection, your health care team will wash or sanitize their hands.  An IV tube will be put in a vein in your hand or arm. You will get medicines and fluids through this tube.  You will be given one or more of the following: ? A medicine to help you relax (sedative). ? A medicine to numb the area (local anesthetic). This medicine may be sprayed into your throat. It will make you feel more comfortable and keep you from gagging or coughing during the procedure. ? A medicine for pain.  A  mouth guard may be placed in your mouth to protect your teeth and to keep you from biting on the endoscope.  You will be asked to lie on your left side.  The endoscope will be lowered down your throat into your esophagus, stomach, and duodenum.  Air will be put into the endoscope. This will help your health care provider see better.  The lining of your esophagus, stomach, and duodenum will be examined.  Your health care provider may: ? Take a tissue sample so it can be looked at in a lab (biopsy). ? Remove growths. ? Remove objects (foreign bodies) that are stuck. ? Treat any bleeding with medicines or other devices that stop tissue from bleeding. ? Widen (dilate) or stretch narrowed areas of your esophagus and stomach.  The endoscope will be taken out. The procedure may vary among health care providers and hospitals. What happens after the procedure?  Your blood pressure, heart rate, breathing rate, and blood oxygen level will be monitored often until the medicines you were given have worn off.  Do not eat or drink anything until the numbing medicine has worn off and your gag reflex has returned. This information is not intended to replace advice given to you by your health care provider. Make sure you discuss any questions you have with your health care provider. Document Released: 05/02/2004 Document Revised: 06/07/2015 Document Reviewed: 11/23/2014 Elsevier Interactive Patient Education  2018 [employer]. Esophagogastroduodenoscopy, Care After Refer to this sheet in the next few weeks. These instructions provide you with information about caring for yourself after your procedure. Your health care provider may also give you more specific instructions. Your treatment has been planned according to current medical practices, but problems sometimes occur. Call your health care provider if you have any problems or questions after your procedure. What can I expect after the  procedure? After the procedure, it is common to have:  A sore throat.  Nausea.  Bloating.  Dizziness.  Fatigue.  Follow these instructions at home:  Do not eat or drink anything until the numbing medicine (local anesthetic) has worn off and your gag reflex has returned. You will know that the local anesthetic has worn off when you can swallow comfortably.  Do not drive for 24 hours if you received a medicine to help you relax (sedative).  If your health care provider took a tissue sample for testing during the procedure, make sure to get your test results. This is your responsibility. Ask your health care provider or the department performing the test when your results will be ready.  Keep all follow-up visits as told by your health care provider. This is important. Contact a health care provider if:  You cannot stop coughing.  You are not urinating.  You are urinating less than usual. Get help right away if:  You have trouble swallowing.  You cannot eat or drink.  You  have throat or chest pain that gets worse.  You are dizzy or light-headed.  You faint.  You have nausea or vomiting.  You have chills.  You have a fever.  You have severe abdominal pain.  You have black, tarry, or bloody stools. This information is not intended to replace advice given to you by your health care provider. Make sure you discuss any questions you have with your health care provider. Document Released: 12/17/2011 Document Revised: 06/07/2015 Document Reviewed: 11/23/2014 Elsevier Interactive Patient Education  2018 [employer].  Esophageal Dilatation Esophageal dilatation is a procedure to open a blocked or narrowed part of the esophagus. The esophagus is the long tube in your throat that carries food and liquid from your mouth to your stomach. The procedure is also called esophageal dilation. You may need this procedure if you have a buildup of scar tissue in your esophagus that  makes it difficult, painful, or even impossible to swallow. This can be caused by gastroesophageal reflux disease (GERD). In rare cases, people need this procedure because they have cancer of the esophagus or a problem with the way food moves through the esophagus. Sometimes you may need to have another dilatation to enlarge the opening of the esophagus gradually. Tell a health care provider about:  Any allergies you have.  All medicines you are taking, including vitamins, herbs, eye drops, creams, and over-the-counter medicines.  Any problems you or family members have had with anesthetic medicines.  Any blood disorders you have.  Any surgeries you have had.  Any medical conditions you have.  Any antibiotic medicines you are required to take before dental procedures. What are the risks? Generally, this is a safe procedure. However, problems can occur and include:  Bleeding from a tear in the lining of the esophagus.  A hole (perforation) in the esophagus.  What happens before the procedure?  Do not eat or drink anything after midnight on the night before the procedure or as directed by your health care provider.  Ask your health care provider about changing or stopping your regular medicines. This is especially important if you are taking diabetes medicines or blood thinners.  Plan to have someone take you home after the procedure. What happens during the procedure?  You will be given a medicine that makes you relaxed and sleepy (sedative).  A medicine may be sprayed or gargled to numb the back of the throat.  Your health care provider can use various instruments to do an esophageal dilatation. During the procedure, the instrument used will be placed in your mouth and passed down into your esophagus. Options include: ? Simple dilators. This instrument is carefully placed in the esophagus to stretch it. ? Guided wire bougies. In this method, a flexible tube (endoscope) is used  to insert a wire into the esophagus. The dilator is passed over this wire to enlarge the esophagus. Then the wire is removed. ? Balloon dilators. An endoscope with a small balloon at the end is passed down into the esophagus. Inflating the balloon gently stretches the esophagus and opens it up. What happens after the procedure?  Your blood pressure, heart rate, breathing rate, and blood oxygen level will be monitored often until the medicines you were given have worn off.  Your throat may feel slightly sore and will probably still feel numb. This will improve slowly over time.  You will not be allowed to eat or drink until the throat numbness has resolved.  If this is a  same-day procedure, you may be allowed to go home once you have been able to drink, urinate, and sit on the edge of the bed without nausea or dizziness.  If this is a same-day procedure, you should have a friend or family member with you for the next 24 hours after the procedure. This information is not intended to replace advice given to you by your health care provider. Make sure you discuss any questions you have with your health care provider. Document Released: 02/20/2005 Document Revised: 06/07/2015 Document Reviewed: 05/11/2013 Elsevier Interactive Patient Education  [employer].  Colonoscopy, Adult A colonoscopy is an exam to look at the large intestine. It is done to check for problems, such as:  Lumps (tumors).  Growths (polyps).  Swelling (inflammation).  Bleeding.  What happens before the procedure? Eating and drinking Follow instructions from your doctor about eating and drinking. These instructions may include:  A few days before the procedure - follow a low-fiber diet. ? Avoid nuts. ? Avoid seeds. ? Avoid dried fruit. ? Avoid raw fruits. ? Avoid vegetables.  1-3 days before the procedure - follow a clear liquid diet. Avoid liquids that have red or purple dye. Drink only clear liquids, such  as: ? Clear broth or bouillon. ? Black coffee or tea. ? Clear juice. ? Clear soft drinks or sports drinks. ? Gelatin dessert. ? Popsicles.  On the day of the procedure - do not eat or drink anything during the 2 hours before the procedure.  Bowel prep If you were prescribed an oral bowel prep:  Take it as told by your doctor. Starting the day before your procedure, you will need to drink a lot of liquid. The liquid will cause you to poop (have bowel movements) until your poop is almost clear or light green.  If your skin or butt gets irritated from diarrhea, you may: ? Wipe the area with wipes that have medicine in them, such as adult wet wipes with aloe and vitamin E. ? Put something on your skin that soothes the area, such as petroleum jelly.  If you throw up (vomit) while drinking the bowel prep, take a break for up to 60 minutes. Then begin the bowel prep again. If you keep throwing up and you cannot take the bowel prep without throwing up, call your doctor.  General instructions  Ask your doctor about changing or stopping your normal medicines. This is important if you take diabetes medicines or blood thinners.  Plan to have someone take you home from the hospital or clinic. What happens during the procedure?  An IV tube may be put into one of your veins.  You will be given medicine to help you relax (sedative).  To reduce your risk of infection: ? Your doctors will wash their hands. ? Your anal area will be washed with soap.  You will be asked to lie on your side with your knees bent.  Your doctor will get a long, thin, flexible tube ready. The tube will have a camera and a light on the end.  The tube will be put into your anus.  The tube will be gently put into your large intestine.  Air will be delivered into your large intestine to keep it open. You may feel some pressure or cramping.  The camera will be used to take photos.  A small tissue sample may be  removed from your body to be looked at under a microscope (biopsy). If any possible problems  are found, the tissue will be sent to a lab for testing.  If small growths are found, your doctor may remove them and have them checked for cancer.  The tube that was put into your anus will be slowly removed. The procedure may vary among doctors and hospitals. What happens after the procedure?  Your doctor will check on you often until the medicines you were given have worn off.  Do not drive for 24 hours after the procedure.  You may have a small amount of blood in your poop.  You may pass gas.  You may have mild cramps or bloating in your belly (abdomen).  It is up to you to get the results of your procedure. Ask your doctor, or the department performing the procedure, when your results will be ready. This information is not intended to replace advice given to you by your health care provider. Make sure you discuss any questions you have with your health care provider. Document Released: 02/01/2010 Document Revised: 10/31/2015 Document Reviewed: 03/13/2015 Elsevier Interactive Patient Education  2017 [employer]  Colonoscopy, Adult, Care After This sheet gives you information about how to care for yourself after your procedure. Your health care provider may also give you more specific instructions. If you have problems or questions, contact your health care provider. What can I expect after the procedure? After the procedure, it is common to have:  A small amount of blood in your stool for 24 hours after the procedure.  Some gas.  Mild abdominal cramping or bloating.  Follow these instructions at home: General instructions   For the first 24 hours after the procedure: ? Do not drive or use machinery. ? Do not sign important documents. ? Do not drink alcohol. ? Do your regular daily activities at a slower pace than normal. ? Eat soft, easy-to-digest foods. ? Rest  often.  Take over-the-counter or prescription medicines only as told by your health care provider.  It is up to you to get the results of your procedure. Ask your health care provider, or the department performing the procedure, when your results will be ready. Relieving cramping and bloating  Try walking around when you have cramps or feel bloated.  Apply heat to your abdomen as told by your health care provider. Use a heat source that your health care provider recommends, such as a moist heat pack or a heating pad. ? Place a towel between your skin and the heat source. ? Leave the heat on for 20-30 minutes. ? Remove the heat if your skin turns bright red. This is especially important if you are unable to feel pain, heat, or cold. You may have a greater risk of getting burned. Eating and drinking  Drink enough fluid to keep your urine clear or pale yellow.  Resume your normal diet as instructed by your health care provider. Avoid heavy or fried foods that are hard to digest.  Avoid drinking alcohol for as long as instructed by your health care provider. Contact a health care provider if:  You have blood in your stool 2-3 days after the procedure. Get help right away if:  You have more than a small spotting of blood in your stool.  You pass large blood clots in your stool.  Your abdomen is swollen.  You have nausea or vomiting.  You have a fever.  You have increasing abdominal pain that is not relieved with medicine. This information is not intended to replace advice given to  you by your health care provider. Make sure you discuss any questions you have with your health care provider. Document Released: 08/14/2003 Document Revised: 09/24/2015 Document Reviewed: 03/13/2015 Elsevier Interactive Patient Education  2018 [place] Anesthesia is a term that refers to techniques, procedures, and medicines that help a person stay safe and comfortable  during a medical procedure. Monitored anesthesia care, or sedation, is one type of anesthesia. Your anesthesia specialist may recommend sedation if you will be having a procedure that does not require you to be unconscious, such as:  Cataract surgery.  A dental procedure.  A biopsy.  A colonoscopy.  During the procedure, you may receive a medicine to help you relax (sedative). There are three levels of sedation:  Mild sedation. At this level, you may feel awake and relaxed. You will be able to follow directions.  Moderate sedation. At this level, you will be sleepy. You may not remember the procedure.  Deep sedation. At this level, you will be asleep. You will not remember the procedure.  The more medicine you are given, the deeper your level of sedation will be. Depending on how you respond to the procedure, the anesthesia specialist may change your level of sedation or the type of anesthesia to fit your needs. An anesthesia specialist will monitor you closely during the procedure. Let your health care provider know about:  Any allergies you have.  All medicines you are taking, including vitamins, herbs, eye drops, creams, and over-the-counter medicines.  Any use of steroids (by mouth or as a cream).  Any problems you or family members have had with sedatives and anesthetic medicines.  Any blood disorders you have.  Any surgeries you have had.  Any medical conditions you have, such as sleep apnea.  Whether you are pregnant or may be pregnant.  Any use of cigarettes, alcohol, or street drugs. What are the risks? Generally, this is a safe procedure. However, problems may occur, including:  Getting too much medicine (oversedation).  Nausea.  Allergic reaction to medicines.  Trouble breathing. If this happens, a breathing tube may be used to help with breathing. It will be removed when you are awake and breathing on your own.  Heart trouble.  Lung trouble.  Before  the procedure Staying hydrated Follow instructions from your health care provider about hydration, which may include:  Up to 2 hours before the procedure - you may continue to drink clear liquids, such as water, clear fruit juice, black coffee, and plain tea.  Eating and drinking restrictions Follow instructions from your health care provider about eating and drinking, which may include:  8 hours before the procedure - stop eating heavy meals or foods such as meat, fried foods, or fatty foods.  6 hours before the procedure - stop eating light meals or foods, such as toast or cereal.  6 hours before the procedure - stop drinking milk or drinks that contain milk.  2 hours before the procedure - stop drinking clear liquids.  Medicines Ask your health care provider about:  Changing or stopping your regular medicines. This is especially important if you are taking diabetes medicines or blood thinners.  Taking medicines such as aspirin and ibuprofen. These medicines can thin your blood. Do not take these medicines before your procedure if your health care provider instructs you not to.  Tests and exams  You will have a physical exam.  You may have blood tests done to show: ? How well your  kidneys and liver are working. ? How well your blood can clot.  General instructions  Plan to have someone take you home from the hospital or clinic.  If you will be going home right after the procedure, plan to have someone with you for 24 hours.  What happens during the procedure?  Your blood pressure, heart rate, breathing, level of pain and overall condition will be monitored.  An IV tube will be inserted into one of your veins.  Your anesthesia specialist will give you medicines as needed to keep you comfortable during the procedure. This may mean changing the level of sedation.  The procedure will be performed. After the procedure  Your blood pressure, heart rate, breathing rate, and  blood oxygen level will be monitored until the medicines you were given have worn off.  Do not drive for 24 hours if you received a sedative.  You may: ? Feel sleepy, clumsy, or nauseous. ? Feel forgetful about what happened after the procedure. ? Have a sore throat if you had a breathing tube during the procedure. ? Vomit. This information is not intended to replace advice given to you by your health care provider. Make sure you discuss any questions you have with your health care provider. Document Released: 09/25/2004 Document Revised: 06/08/2015 Document Reviewed: 04/22/2015 Elsevier Interactive Patient Education  2018 [place], Care After These instructions provide you with information about caring for yourself after your procedure. Your health care provider may also give you more specific instructions. Your treatment has been planned according to current medical practices, but problems sometimes occur. Call your health care provider if you have any problems or questions after your procedure. What can I expect after the procedure? After your procedure, it is common to:  Feel sleepy for several hours.  Feel clumsy and have poor balance for several hours.  Feel forgetful about what happened after the procedure.  Have poor judgment for several hours.  Feel nauseous or vomit.  Have a sore throat if you had a breathing tube during the procedure.  Follow these instructions at home: For at least 24 hours after the procedure:   Do not: ? Participate in activities in which you could fall or become injured. ? Drive. ? Use heavy machinery. ? Drink alcohol. ? Take sleeping pills or medicines that cause drowsiness. ? Make important decisions or sign legal documents. ? Take care of children on your own.  Rest. Eating and drinking  Follow the diet that is recommended by your health care provider.  If you vomit, drink water, juice, or soup when you  can drink without vomiting.  Make sure you have little or no nausea before eating solid foods. General instructions  Have a responsible adult stay with you until you are awake and alert.  Take over-the-counter and prescription medicines only as told by your health care provider.  If you smoke, do not smoke without supervision.  Keep all follow-up visits as told by your health care provider. This is important. Contact a health care provider if:  You keep feeling nauseous or you keep vomiting.  You feel light-headed.  You develop a rash.  You have a fever. Get help right away if:  You have trouble breathing. This information is not intended to replace advice given to you by your health care provider. Make sure you discuss any questions you have with your health care provider. Document Released: 04/22/2015 Document Revised: 08/22/2015 Document Reviewed: 04/22/2015 Elsevier Interactive Patient  Education  2018 [employer]  

## 2017-10-14 ENCOUNTER — Other Ambulatory Visit: Payer: Self-pay

## 2017-10-14 ENCOUNTER — Encounter (HOSPITAL_COMMUNITY): Payer: Self-pay

## 2017-10-14 ENCOUNTER — Encounter (HOSPITAL_COMMUNITY)
Admission: RE | Admit: 2017-10-14 | Discharge: 2017-10-14 | Disposition: A | Discharge status: Home / Self Care | Payer: Medicare HMO | Source: Ambulatory Visit | Attending: Gastroenterology | Admitting: Gastroenterology

## 2017-10-14 DIAGNOSIS — Z01810 Encounter for preprocedural cardiovascular examination: Secondary | ICD-10-CM | POA: Diagnosis not present

## 2017-10-14 DIAGNOSIS — M542 Cervicalgia: Secondary | ICD-10-CM | POA: Diagnosis not present

## 2017-10-14 DIAGNOSIS — Z01812 Encounter for preprocedural laboratory examination: Secondary | ICD-10-CM | POA: Diagnosis not present

## 2017-10-14 DIAGNOSIS — Z79891 Long term (current) use of opiate analgesic: Secondary | ICD-10-CM | POA: Diagnosis not present

## 2017-10-14 DIAGNOSIS — M545 Low back pain: Secondary | ICD-10-CM | POA: Diagnosis not present

## 2017-10-14 DIAGNOSIS — G43701 Chronic migraine without aura, not intractable, with status migrainosus: Secondary | ICD-10-CM | POA: Diagnosis not present

## 2017-10-14 LAB — CBC
HCT: 43.3 % (ref 36.0–46.0)
Hemoglobin: 14.2 g/dL (ref 12.0–15.0)
MCH: 30.2 pg (ref 26.0–34.0)
MCHC: 32.8 g/dL (ref 30.0–36.0)
MCV: 92.1 fL (ref 78.0–100.0)
PLATELETS: 250 10*3/uL (ref 150–400)
RBC: 4.7 MIL/uL (ref 3.87–5.11)
RDW: 13.3 % (ref 11.5–15.5)
WBC: 12.1 10*3/uL — ABNORMAL HIGH (ref 4.0–10.5)

## 2017-10-14 LAB — BASIC METABOLIC PANEL
ANION GAP: 7 (ref 5–15)
BUN: 14 mg/dL (ref 6–20)
CALCIUM: 8.8 mg/dL — AB (ref 8.9–10.3)
CO2: 30 mmol/L (ref 22–32)
CREATININE: 0.62 mg/dL (ref 0.44–1.00)
Chloride: 103 mmol/L (ref 98–111)
Glucose, Bld: 97 mg/dL (ref 70–99)
Potassium: 3.9 mmol/L (ref 3.5–5.1)
SODIUM: 140 mmol/L (ref 135–145)

## 2017-10-19 NOTE — Progress Notes (Signed)
CC'D TO PCP °

## 2017-10-20 ENCOUNTER — Encounter (HOSPITAL_COMMUNITY): Payer: Self-pay | Admitting: Gastroenterology

## 2017-10-20 ENCOUNTER — Encounter (HOSPITAL_COMMUNITY)
Admission: RE | Disposition: A | Discharge status: Home / Self Care | Payer: Self-pay | Source: Ambulatory Visit | Attending: Gastroenterology

## 2017-10-20 ENCOUNTER — Other Ambulatory Visit: Payer: Self-pay

## 2017-10-20 ENCOUNTER — Ambulatory Visit (HOSPITAL_COMMUNITY)
Admission: RE | Admit: 2017-10-20 | Discharge: 2017-10-20 | Disposition: A | Discharge status: Home / Self Care | Payer: Medicare HMO | Source: Ambulatory Visit | Attending: Gastroenterology | Admitting: Gastroenterology

## 2017-10-20 ENCOUNTER — Ambulatory Visit (HOSPITAL_COMMUNITY): Payer: Medicare HMO | Admitting: Anesthesiology

## 2017-10-20 DIAGNOSIS — K222 Esophageal obstruction: Secondary | ICD-10-CM | POA: Insufficient documentation

## 2017-10-20 DIAGNOSIS — D128 Benign neoplasm of rectum: Secondary | ICD-10-CM | POA: Diagnosis not present

## 2017-10-20 DIAGNOSIS — G43909 Migraine, unspecified, not intractable, without status migrainosus: Secondary | ICD-10-CM | POA: Diagnosis not present

## 2017-10-20 DIAGNOSIS — K621 Rectal polyp: Secondary | ICD-10-CM | POA: Insufficient documentation

## 2017-10-20 DIAGNOSIS — K2970 Gastritis, unspecified, without bleeding: Secondary | ICD-10-CM | POA: Diagnosis not present

## 2017-10-20 DIAGNOSIS — Z79891 Long term (current) use of opiate analgesic: Secondary | ICD-10-CM | POA: Diagnosis not present

## 2017-10-20 DIAGNOSIS — G609 Hereditary and idiopathic neuropathy, unspecified: Secondary | ICD-10-CM | POA: Diagnosis not present

## 2017-10-20 DIAGNOSIS — Z800 Family history of malignant neoplasm of digestive organs: Secondary | ICD-10-CM | POA: Insufficient documentation

## 2017-10-20 DIAGNOSIS — K257 Chronic gastric ulcer without hemorrhage or perforation: Secondary | ICD-10-CM | POA: Insufficient documentation

## 2017-10-20 DIAGNOSIS — E119 Type 2 diabetes mellitus without complications: Secondary | ICD-10-CM | POA: Diagnosis not present

## 2017-10-20 DIAGNOSIS — Z1211 Encounter for screening for malignant neoplasm of colon: Secondary | ICD-10-CM | POA: Insufficient documentation

## 2017-10-20 DIAGNOSIS — Z79899 Other long term (current) drug therapy: Secondary | ICD-10-CM | POA: Insufficient documentation

## 2017-10-20 DIAGNOSIS — R1310 Dysphagia, unspecified: Secondary | ICD-10-CM | POA: Insufficient documentation

## 2017-10-20 DIAGNOSIS — F17210 Nicotine dependence, cigarettes, uncomplicated: Secondary | ICD-10-CM | POA: Insufficient documentation

## 2017-10-20 DIAGNOSIS — D123 Benign neoplasm of transverse colon: Secondary | ICD-10-CM | POA: Insufficient documentation

## 2017-10-20 DIAGNOSIS — F329 Major depressive disorder, single episode, unspecified: Secondary | ICD-10-CM | POA: Diagnosis not present

## 2017-10-20 DIAGNOSIS — Z8619 Personal history of other infectious and parasitic diseases: Secondary | ICD-10-CM | POA: Insufficient documentation

## 2017-10-20 DIAGNOSIS — K2960 Other gastritis without bleeding: Secondary | ICD-10-CM

## 2017-10-20 DIAGNOSIS — M797 Fibromyalgia: Secondary | ICD-10-CM | POA: Insufficient documentation

## 2017-10-20 DIAGNOSIS — K219 Gastro-esophageal reflux disease without esophagitis: Secondary | ICD-10-CM | POA: Diagnosis not present

## 2017-10-20 DIAGNOSIS — D122 Benign neoplasm of ascending colon: Secondary | ICD-10-CM | POA: Insufficient documentation

## 2017-10-20 DIAGNOSIS — D120 Benign neoplasm of cecum: Secondary | ICD-10-CM | POA: Diagnosis not present

## 2017-10-20 DIAGNOSIS — K635 Polyp of colon: Secondary | ICD-10-CM

## 2017-10-20 DIAGNOSIS — K2950 Unspecified chronic gastritis without bleeding: Secondary | ICD-10-CM | POA: Diagnosis not present

## 2017-10-20 DIAGNOSIS — K3189 Other diseases of stomach and duodenum: Secondary | ICD-10-CM | POA: Insufficient documentation

## 2017-10-20 DIAGNOSIS — G4730 Sleep apnea, unspecified: Secondary | ICD-10-CM | POA: Diagnosis not present

## 2017-10-20 DIAGNOSIS — K5790 Diverticulosis of intestine, part unspecified, without perforation or abscess without bleeding: Secondary | ICD-10-CM | POA: Diagnosis not present

## 2017-10-20 DIAGNOSIS — Z86010 Personal history of colonic polyps: Secondary | ICD-10-CM | POA: Diagnosis not present

## 2017-10-20 HISTORY — PX: SAVORY DILATION: SHX5439

## 2017-10-20 HISTORY — PX: COLONOSCOPY WITH PROPOFOL: SHX5780

## 2017-10-20 HISTORY — PX: ESOPHAGOGASTRODUODENOSCOPY (EGD) WITH PROPOFOL: SHX5813

## 2017-10-20 HISTORY — PX: POLYPECTOMY: SHX5525

## 2017-10-20 SURGERY — COLONOSCOPY WITH PROPOFOL
Anesthesia: General

## 2017-10-20 MED ORDER — LACTATED RINGERS IV SOLN
INTRAVENOUS | Status: DC | PRN
  Administered 2017-10-20: 11:00:00 via INTRAVENOUS

## 2017-10-20 MED ORDER — PANTOPRAZOLE SODIUM 40 MG PO TBEC: DELAYED_RELEASE_TABLET | ORAL | 11 refills | Status: DC

## 2017-10-20 MED ORDER — PROPOFOL 500 MG/50ML IV EMUL
INTRAVENOUS | Status: DC | PRN
  Administered 2017-10-20: 120 ug/kg/min via INTRAVENOUS
  Administered 2017-10-20: 125 ug/kg/min via INTRAVENOUS
  Administered 2017-10-20: 150 ug/kg/min via INTRAVENOUS
  Administered 2017-10-20: 125 ug/kg/min via INTRAVENOUS

## 2017-10-20 MED ORDER — LIDOCAINE HCL URETHRAL/MUCOSAL 2 % EX GEL
CUTANEOUS | Status: DC | PRN
  Administered 2017-10-20: 1

## 2017-10-20 MED ORDER — LIDOCAINE HCL URETHRAL/MUCOSAL 2 % EX GEL
CUTANEOUS | Status: AC
  Filled 2017-10-20: qty 30

## 2017-10-20 MED ORDER — PROPOFOL 10 MG/ML IV BOLUS
INTRAVENOUS | Status: DC | PRN
  Administered 2017-10-20: 60 mg via INTRAVENOUS
  Administered 2017-10-20: 40 mg via INTRAVENOUS
  Administered 2017-10-20: 20 mg via INTRAVENOUS
  Administered 2017-10-20: 10 mg via INTRAVENOUS
  Administered 2017-10-20: 40 mg via INTRAVENOUS
  Administered 2017-10-20: 20 mg via INTRAVENOUS

## 2017-10-20 NOTE — H&P (Addendum)
Primary Care Physician:  Rennis Golden Primary Gastroenterologist:  Dr. Oneida Alar  Pre-Procedure History & Physical: HPI:  Patricia Jones is a 62 y.o. female here for DYSPHAGIA/screening-hyperplastic polyps/grandMother had colon cancer. Pt is CONSIDERED AVEREAGE RISK. DRANK GATORADE IN PREOP.   Past Medical History:  Diagnosis Date  . Allergy   . Chronic migraine without aura, with intractable migraine, so stated, without mention of status migrainosus 12/08/2012  . Depression   . DJD (degenerative joint disease)   . Fibromyalgia   . GERD (gastroesophageal reflux disease)   . Hepatitis C 1980   symptomatic w/ jaundice, NEGATIVE HCV RNA  08/2005, positive hepatitis C antibody but negative HCVRNA in 2019  . Hypersomnia with sleep apnea, unspecified   . Insomnia   . Lumbago 12/08/2012  . Migraines   . Myalgia and myositis, unspecified 12/08/2012  . Overactive bladder   . S/P colonoscopy 10/29/1999   Dr Arita Miss polyps-hyperplastic  . S/P colonoscopy 09/12/2005   4 hyperplastic polyps  . Sleep apnea    CPAP  . Tobacco dependence   . Type II or unspecified type diabetes mellitus without mention of complication, not stated as uncontrolled   . Unspecified hereditary and idiopathic peripheral neuropathy     Past Surgical History:  Procedure Laterality Date  . ABDOMINAL HYSTERECTOMY  10/30/1999   TAH/BSO  . COLONOSCOPY     2007, 2001, hyperplastic polyps  . complete hyst  2001  . EXPLORATORY LAPAROTOMY     Ruptured ovarian cyst with hemorrhage in her 46s  . FOOT SURGERY Left   . HIP SURGERY     laproscopic left  . PELVIC LAPAROSCOPY     Multiple for ovarian cysts and endometriosis, infertility issues  . SINUS SURGERY WITH INSTATRAK      Prior to Admission medications   Medication Sig Start Date End Date Taking? Authorizing Provider  AIMOVIG 70 MG/ML SOAJ Inject 70 mg into the skin every 30 (thirty) days.  04/22/17  Yes [provider]  amitriptyline (ELAVIL) 10  MG tablet Take 20 mg by mouth at bedtime.  05/12/17  Yes [provider]  azithromycin (ZITHROMAX) 250 MG tablet Day 1: Take 2 daily. Days 2 -5: Take 1 daily. 10/07/17  Yes Dena Billet B, PA-C  baclofen (LIORESAL) 10 MG tablet Take 10 mg by mouth 3 (three) times daily as needed for muscle spasms.    Yes [provider]  buprenorphine (SUBUTEX) 2 MG SUBL SL tablet Place 2-4 mg under the tongue daily as needed (for pain).  09/16/17  Yes [provider]  butalbital-acetaminophen-caffeine (FIORICET, ESGIC) 50-325-40 MG per tablet Take 1-2 tablets by mouth every 4 (four) hours as needed for headache or migraine.  07/09/10  Yes [provider]  Calcium Carbonate-Vitamin D (SM CALCIUM 500/VITAMIN D3 PO) Take 1 tablet by mouth daily.   Yes [provider]  Cholecalciferol (VITAMIN D) 2000 UNITS tablet Take 2,000 Units by mouth daily.   Yes [provider]  diazepam (VALIUM) 10 MG tablet Take 10 mg by mouth at bedtime as needed for anxiety.  06/15/15  Yes [provider]  FLUoxetine (PROZAC) 40 MG capsule Take 40 mg by mouth at bedtime.  06/17/10  Yes [provider]  gabapentin (NEURONTIN) 600 MG tablet Take 600 mg by mouth 3 (three) times daily as needed (for pain).    Yes [provider]  polyethylene glycol-electrolytes (TRILYTE) 420 g solution Take 4,000 mLs by mouth as directed. 08/11/17  Yes Fields, [employer] [facility]  L, MD  traMADol (ULTRAM) 50 MG tablet Take 50-100 mg by mouth every 6 (six) hours as needed for moderate pain.  06/15/15  Yes [provider]  SUMAtriptan (IMITREX) 6 MG/0.5ML SOLN Inject 6 mg into the skin every 2 (two) hours as needed for migraine or headache.  07/09/10   [provider]    Allergies as of 08/11/2017 - Review Complete 08/11/2017  Allergen Reaction Noted  . Sulfonamide derivatives Rash 05/10/2008    Family History  Problem Relation Age of Onset  . Aneurysm Mother   . Heart disease Mother   .  Coronary artery disease Father   . Stroke Father   . Heart disease Father   . Cervical cancer Sister   . Ovarian cancer Sister   . Colon cancer Maternal Grandmother        greater than age 23. was her great grandmother  . Diabetes Maternal Aunt     Social History   Socioeconomic History  . Marital status: Divorced    Spouse name: Not on file  . Number of children: 2  . Years of education: Not on file  . Highest education level: Not on file  Occupational History  . Occupation: disabled     Comment: previously Therapist, sports @ [employer]  . Financial resource strain: Not on file  . Food insecurity:    Worry: Not on file    Inability: Not on file  . Transportation needs:    Medical: Not on file    Non-medical: Not on file  Tobacco Use  . Smoking status: Current Every Day Smoker    Packs/day: 0.50    Years: 40.00    Pack years: 20.00    Types: Cigarettes  . Smokeless tobacco: Never Used  Substance and Sexual Activity  . Alcohol use: No    Comment: heavy etoh x 30 yrs, quit 14 yrs ago  . Drug use: Yes    Types: Marijuana    Comment: maijuana use-occ  . Sexual activity: Yes    Partners: Male    Comment: [facility]  Lifestyle  . Physical activity:    Days per week: Not on file    Minutes per session: Not on file  . Stress: Not on file  Relationships  . Social connections:    Talks on phone: Not on file    Gets together: Not on file    Attends religious service: Not on file    Active member of club or organization: Not on file    Attends meetings of clubs or organizations: Not on file    Relationship status: Not on file  . Intimate partner violence:    Fear of current or ex partner: Not on file    Emotionally abused: Not on file    Physically abused: Not on file    Forced sexual activity: Not on file  Other Topics Concern  . Not on file  Social History Narrative  . Not on file    Review of Systems: See HPI, otherwise negative ROS   Physical Exam: BP 110/62    Pulse 60   Temp 98.6 F (37 C) (Oral)   Resp 15   SpO2 93%  General:   Alert,  pleasant and cooperative in NAD Head:  Normocephalic and atraumatic. Neck:  Supple; Lungs:  Clear throughout to auscultation.    Heart:  Regular rate and rhythm. Abdomen:  Soft, nontender and nondistended. Normal bowel sounds, without guarding, and without rebound.  Neurologic:  Alert and  oriented x4;  grossly normal neurologically.  Impression/Plan:     DYSPHAGIA/screening  PLAN:  EGD/DIL/tcs TODAY DISCUSSED PROCEDURE, BENEFITS, & RISKS: < 1% chance of medication reaction, bleeding, perforation, or rupture of spleen/liver.

## 2017-10-20 NOTE — Op Note (Signed)
[facility] Patient Name: Patricia Jones Procedure Date: 10/20/2017 10:35 AM MRN: [id number] Date of Birth: [birth year] Attending MD: Barney Drain MD, MD CSN: [id number] Age: 62 Admit Type: Outpatient Procedure:                Colonoscopy-EMR/INJECTION/SNARE CAUTERY/COLD SNARE                            POLYPECTOMY Indications:              Screening for colon cancer: Family history of                            colorectal cancer in distant relative(s), Personal                            history of colonic polyps Providers:                Barney Drain MD, MD, Janeece Riggers, RN, Aram Candela Referring MD:             Lonie Peak. Dixon Medicines:                Propofol per Anesthesia Complications:            No immediate complications. Estimated Blood Loss:     Estimated blood loss was minimal. Procedure:                Pre-Anesthesia Assessment:                           - Prior to the procedure, a History and Physical                            was performed, and patient medications and                            allergies were reviewed. The patient's tolerance of                            previous anesthesia was also reviewed. The risks                            and benefits of the procedure and the sedation                            options and risks were discussed with the patient.                            All questions were answered, and informed consent                            was obtained. Prior Anticoagulants: The patient has                            taken no previous anticoagulant or antiplatelet  agents. ASA Grade Assessment: II - A patient with                            mild systemic disease. After reviewing the risks                            and benefits, the patient was deemed in                            satisfactory condition to undergo the procedure.                            After obtaining informed consent, the colonoscope                           was passed under direct vision. Throughout the                            procedure, the patient's blood pressure, pulse, and                            oxygen saturations were monitored continuously. The                            CF-HQ190L ([id number]) scope was introduced through                            the anus and advanced to the the cecum, identified                            by appendiceal orifice and ileocecal valve. The                            colonoscopy was somewhat difficult due to a                            tortuous colon. Successful completion of the                            procedure was aided by straightening and shortening                            the scope to obtain bowel loop reduction and                            COLOWRAP. The patient tolerated the procedure                            fairly well. The quality of the bowel preparation                            was good. The ileocecal valve, appendiceal orifice,  and rectum were photographed. Scope In: 11:16:32 AM Scope Out: 11:56:32 AM Scope Withdrawal Time: 0 hours 36 minutes 32 seconds  Total Procedure Duration: 0 hours 40 minutes 0 seconds  Findings:      Three sessile polyps were found in the ascending colon and cecum. The       polyps were 8 to 15 mm in size. Area was successfully injected with 9 mL       ELEVIEW for a lift polypectomy. These polyps were removed with       ENDOSCOPIC MUCOSAL RESECTION. Resection and retrieval were complete. To       prevent bleeding after the polypectomy, three hemostatic clips(2 CECEUM,       1: AC) were successfully placed (MR conditional). There was no bleeding       at the end of the procedure.      Two sessile polyps were found in the splenic flexure and proximal       transverse colon. The polyps were 5 to 7 mm in size. These polyps were       removed with a hot snare. Resection and retrieval were complete.       Two sessile polyps were found in the rectum. The polyps were 5 to 7 mm       in size. These polyps were removed with a cold snare. Resection and       retrieval were complete.      A few small-mouthed diverticula were found in the recto-sigmoid colon       and sigmoid colon. Impression:               - Three 8 to 15 mm polyps in the ascending colon                            and in the cecum, removed with a hot snare.                            Resected and retrieved. Injected. Clips (MR                            conditional) were placed.                           - Two 5 to 7 mm polyps at the splenic flexure and                            in the proximal transverse colon, removed with a                            hot snare. Resected and retrieved.                           - Two 5 to 7 mm polyps in the rectum, removed with                            a cold snare. Resected and retrieved. Moderate Sedation:      Per Anesthesia Care Recommendation:           - Patient has a contact number available for  emergencies. The signs and symptoms of potential                            delayed complications were discussed with the                            patient. Return to normal activities tomorrow.                            Written discharge instructions were provided to the                            patient.                           - High fiber diet.                           - Continue present medications.                           - Await pathology results.                           - Repeat colonoscopy in 5-10 years for surveillance.                           - Return to my office in 4 months. Procedure Code(s):        --- Professional ---                           [phone], Colonoscopy, flexible; with removal of                            tumor(s), polyp(s), or other lesion(s) by snare                            technique                           45381,  Colonoscopy, flexible; with directed                            submucosal injection(s), any substance Diagnosis Code(s):        --- Professional ---                           Z12.11, Encounter for screening for malignant                            neoplasm of colon                           Z80.0, Family history of malignant neoplasm of                            digestive organs  D12.2, Benign neoplasm of ascending colon                           D12.0, Benign neoplasm of cecum                           D12.3, Benign neoplasm of transverse colon (hepatic                            flexure or splenic flexure)                           K62.1, Rectal polyp                           Z86.010, Personal history of colonic polyps CPT copyright 2018 American Medical Association. All rights reserved. The codes documented in this report are preliminary and upon coder review may  be revised to meet current compliance requirements. Barney Drain, MD Barney Drain MD, MD 10/20/2017 12:34:27 PM This report has been signed electronically. Number of Addenda: 0

## 2017-10-20 NOTE — Anesthesia Preprocedure Evaluation (Addendum)
Anesthesia Evaluation  Patient identified by MRN, date of birth, ID band Patient awake    Reviewed: Allergy & Precautions, NPO status , Patient's Chart, lab work & pertinent test results  Airway Mallampati: II  TM Distance: >3 FB Neck ROM: Full    Dental no notable dental hx. (+) Poor Dentition, Missing   Pulmonary sleep apnea , Current Smoker,    Pulmonary exam normal breath sounds clear to auscultation       Cardiovascular Exercise Tolerance: Poor negative cardio ROS Normal cardiovascular examII Rhythm:Regular Rate:Normal  Normal ECG -denies any cardiac history    Neuro/Psych  Headaches, Depression C/o LBP on suboxone   Neuromuscular disease negative psych ROS   GI/Hepatic GERD  Medicated and Controlled,(+) Hepatitis -, C  Endo/Other  negative endocrine ROSdiabetes  Renal/GU negative Renal ROS  negative genitourinary   Musculoskeletal  (+) Arthritis , Fibromyalgia -, narcotic dependentsuboxone    Abdominal   Peds negative pediatric ROS (+)  Hematology negative hematology ROS (+)   Anesthesia Other Findings   Reproductive/Obstetrics negative OB ROS                             Anesthesia Physical Anesthesia Plan  ASA: III  Anesthesia Plan: General   Post-op Pain Management:    Induction: Intravenous  PONV Risk Score and Plan:   Airway Management Planned: Nasal Cannula and Simple Face Mask  Additional Equipment:   Intra-op Plan:   Post-operative Plan:   Informed Consent: I have reviewed the patients History and Physical, chart, labs and discussed the procedure including the risks, benefits and alternatives for the proposed anesthesia with the patient or authorized representative who has indicated his/her understanding and acceptance.   Dental advisory given  Plan Discussed with: CRNA  Anesthesia Plan Comments: (Pt reported drinking gatorade at 915am - small volume -will  wait until ~1115am for GA)       Anesthesia Quick Evaluation

## 2017-10-20 NOTE — Anesthesia Postprocedure Evaluation (Signed)
Anesthesia Post Note  Patient: Patricia Jones  Procedure(s) Performed: COLONOSCOPY WITH PROPOFOL (N/A ) ESOPHAGOGASTRODUODENOSCOPY (EGD) WITH PROPOFOL (N/A ) SAVORY DILATION (N/A ) POLYPECTOMY  Patient location during evaluation: PACU Anesthesia Type: General Level of consciousness: awake and alert and patient cooperative Pain management: satisfactory to patient Vital Signs Assessment: post-procedure vital signs reviewed and stable Respiratory status: spontaneous breathing Cardiovascular status: stable Postop Assessment: no apparent nausea or vomiting Anesthetic complications: no     Last Vitals:  Vitals:   10/20/17 1230 10/20/17 1248  BP: 120/63 131/67  Pulse: 64 61  Resp: 14 16  Temp:  (!) 36.4 C  SpO2: 94% 92%    Last Pain:  Vitals:   10/20/17 1248  TempSrc: Oral  PainSc: 2                  Bonnee Zertuche

## 2017-10-20 NOTE — Transfer of Care (Signed)
Immediate Anesthesia Transfer of Care Note  Patient: Patricia Jones  Procedure(s) Performed: COLONOSCOPY WITH PROPOFOL (N/A ) ESOPHAGOGASTRODUODENOSCOPY (EGD) WITH PROPOFOL (N/A ) SAVORY DILATION (N/A ) POLYPECTOMY  Patient Location: PACU  Anesthesia Type:General  Level of Consciousness: awake and patient cooperative  Airway & Oxygen Therapy: Patient Spontanous Breathing  Post-op Assessment: Report given to RN and Post -op Vital signs reviewed and stable  Post vital signs: Reviewed and stable  Last Vitals:  Vitals Value Taken Time  BP 98/57 10/20/2017 12:18 PM  Temp    Pulse 62 10/20/2017 12:21 PM  Resp 13 10/20/2017 12:21 PM  SpO2 94 % 10/20/2017 12:21 PM  Vitals shown include unvalidated device data.  Last Pain:  Vitals:   10/20/17 0950  TempSrc: Oral  PainSc: 2       Patients Stated Pain Goal: 5 (64/15/83 0940)  Complications: No apparent anesthesia complications

## 2017-10-20 NOTE — Op Note (Signed)
[facility] Patient Name: Patricia Jones Procedure Date: 10/20/2017 10:35 AM MRN: [id number] Date of Birth: 08-15-1955 Attending MD: Barney Drain MD, MD CSN: [id number] Age: 62 Admit Type: Outpatient Procedure:                Upper GI endoscopy WITH COLD FORCEPS                            BIOPSY/ESOPHAGEAL DILATION Indications:              Dysphagia-DRANK GATORADE THE AM OF HER EGD. Providers:                Barney Drain MD, MD, Janeece Riggers, RN, Aram Candela Referring MD:             Lonie Peak Dixon Medicines:                Propofol per Anesthesia Complications:            No immediate complications. Estimated Blood Loss:     Estimated blood loss was minimal. Procedure:                Pre-Anesthesia Assessment:                           - Prior to the procedure, a History and Physical                            was performed, and patient medications and                            allergies were reviewed. The patient's tolerance of                            previous anesthesia was also reviewed. The risks                            and benefits of the procedure and the sedation                            options and risks were discussed with the patient.                            All questions were answered, and informed consent                            was obtained. Prior Anticoagulants: The patient has                            taken no previous anticoagulant or antiplatelet                            agents. ASA Grade Assessment: II - A patient with                            mild systemic disease. After reviewing the risks  and benefits, the patient was deemed in                            satisfactory condition to undergo the procedure.                            After obtaining informed consent, the endoscope was                            passed under direct vision. Throughout the                            procedure, the patient's blood  pressure, pulse, and                            oxygen saturations were monitored continuously. The                            [id number]-[id number] ([id number]) scope was introduced through the                            mouth, and advanced to the second part of duodenum.                            The upper GI endoscopy was accomplished without                            difficulty. The patient tolerated the procedure                            well. Scope In: 12:02:13 PM Scope Out: 12:12:34 PM Total Procedure Duration: 0 hours 10 minutes 21 seconds  Findings:      One benign-appearing, intrinsic moderate (circumferential scarring or       stenosis; an endoscope may pass) stenosis was found. This stenosis       measured 1.4 cm (inner diameter). The stenosis was traversed. A       guidewire was placed and the scope was withdrawn. Dilation was performed       with a Savary dilator with moderate resistance at 15 mm, 16 mm and 17       mm. Estimated blood loss was minimal.      Diffuse moderate inflammation characterized by congestion (edema),       erosions and erythema was found in the entire examined stomach.       Biopsies(3: BODY, 3: ANTRUM) were taken with a cold forceps for       Helicobacter pylori testing.      The examined duodenum was normal. Impression:               - Benign-appearing esophageal STRICTURE DUE TO GERD.                           - MODERATE EROSIVE Gastritis. Biopsied. Moderate Sedation:      Per Anesthesia Care Recommendation:           - Patient has a contact number available for  emergencies. The signs and symptoms of potential                            delayed complications were discussed with the                            patient. Return to normal activities tomorrow.                            Written discharge instructions were provided to the                            patient.                           - High fiber diet and low fat diet.                            - Continue present medications.                           - Await pathology results.                           - Return to my office in 4 months. Procedure Code(s):        --- Professional ---                           [id number], Esophagogastroduodenoscopy, flexible,                            transoral; with insertion of guide wire followed by                            passage of dilator(s) through esophagus over guide                            wire                           43239, 73, Esophagogastroduodenoscopy, flexible,                            transoral; with biopsy, single or multiple Diagnosis Code(s):        --- Professional ---                           K22.2, Esophageal obstruction                           K29.70, Gastritis, unspecified, without bleeding                           R13.10, Dysphagia, unspecified CPT copyright 2018 American Medical Association. All rights reserved. The codes documented in this report are preliminary and upon coder review may  be revised to meet current compliance requirements. Barney Drain, MD Patricia Jones  Zakary Kimura MD, MD 10/20/2017 12:38:34 PM This report has been signed electronically. Number of Addenda: 0

## 2017-10-20 NOTE — Discharge Instructions (Signed)
You had 6 polyps removed. I PLACED THREE CLIPS IN YOUR RIGHT COLON TO PREVENT BLEEDING IN 7-10 DAYS. You have diverticulosis IN YOUR LEFT COLON. I dilated your esophagus DUE TO A STRICTURE. You have EROSIVE gastritis. I biopsied your stomach.   IF YOU NEED AN MRI, LET THE RADIOLOGY TECH KNOW THAT YOU HAD THREE CLIPS PLACED IN YOUR RIGHT COLON. THEY SHOULD FALL OFF IN 30 DAYS.  FOLLOW A HIGH FIBER/LOW FAT DIET. AVOID ITEMS THAT CAUSE BLOATING. SEE INFO BELOW.  TO PREVENT ULCERS AND GASTRITIS, START PROTONIX. TAKE 30 MINUTES PRIOR TO YOUR FIRST MEAL DAILY.  YOUR BIOPSY RESULTS WILL BE BACK IN 5 BUSINESS DAYS.  FOLLOW UP IN 4 MOS.  Next colonoscopy in 5-10 years.  ENDOSCOPY Care After Read the instructions outlined below and refer to this sheet in the next week. These discharge instructions provide you with general information on caring for yourself after you leave the hospital. While your treatment has been planned according to the most current medical practices available, unavoidable complications occasionally occur. If you have any problems or questions after discharge, call DR. Nate Common, [phone].  ACTIVITY  You may resume your regular activity, but move at a slower pace for the next 24 hours.   Take frequent rest periods for the next 24 hours.   Walking will help get rid of the air and reduce the bloated feeling in your belly (abdomen).   No driving for 24 hours (because of the medicine (anesthesia) used during the test).   You may shower.   Do not sign any important legal documents or operate any machinery for 24 hours (because of the anesthesia used during the test).    NUTRITION  Drink plenty of fluids.   You may resume your normal diet as instructed by your doctor.   Begin with a light meal and progress to your normal diet. Heavy or fried foods are harder to digest and may make you feel sick to your stomach (nauseated).   Avoid alcoholic beverages for 24 hours or as  instructed.    MEDICATIONS  You may resume your normal medications.   WHAT YOU CAN EXPECT TODAY  Some feelings of bloating in the abdomen.   Passage of more gas than usual.   Spotting of blood in your stool or on the toilet paper  .  IF YOU HAD POLYPS REMOVED DURING THE ENDOSCOPY:  Eat a soft diet IF YOU HAVE NAUSEA, BLOATING, ABDOMINAL PAIN, OR VOMITING.    FINDING OUT THE RESULTS OF YOUR TEST Not all test results are available during your visit. DR. Oneida Alar WILL CALL YOU WITHIN 14 DAYS OF YOUR PROCEDUE WITH YOUR RESULTS. Do not assume everything is normal if you have not heard from DR. Bandon Sherwin, CALL HER OFFICE AT [phone].  SEEK IMMEDIATE MEDICAL ATTENTION AND CALL THE OFFICE: [phone] IF:  You have more than a spotting of blood in your stool.   Your belly is swollen (abdominal distention).   You are nauseated or vomiting.   You have a temperature over 101F.   You have abdominal pain or discomfort that is severe or gets worse throughout the day.  Gastritis  Gastritis is an inflammation (the body's way of reacting to injury and/or infection) of the stomach. It is often caused by viral or bacterial (germ) infections. It can also be caused BY ASPIRIN, BC/GOODY POWDER'S, (IBUPROFEN) MOTRIN, OR ALEVE (NAPROXEN), chemicals (including alcohol), SPICY FOODS, and medications. This illness may be associated with generalized malaise (feeling tired, not  well), UPPER ABDOMINAL STOMACH cramps, and fever. One common bacterial cause of gastritis is an organism known as H. Pylori. This can be treated with antibiotics.   High-Fiber Diet A high-fiber diet changes your normal diet to include more whole grains, legumes, fruits, and vegetables. Changes in the diet involve replacing refined carbohydrates with unrefined foods. The calorie level of the diet is essentially unchanged. The Dietary Reference Intake (recommended amount) for adult males is 38 grams per day. For adult females, it  is 25 grams per day. Pregnant and lactating women should consume 28 grams of fiber per day. Fiber is the intact part of a plant that is not broken down during digestion. Functional fiber is fiber that has been isolated from the plant to provide a beneficial effect in the body.  PURPOSE  Increase stool bulk.   Ease and regulate bowel movements.   Lower cholesterol.  REDUCE RISK OF COLON CANCER  INDICATIONS THAT YOU NEED MORE FIBER  Constipation and hemorrhoids.   Uncomplicated diverticulosis (intestine condition) and irritable bowel syndrome.   Weight management.   As a protective measure against hardening of the arteries (atherosclerosis), diabetes, and cancer.   GUIDELINES FOR INCREASING FIBER IN THE DIET  Start adding fiber to the diet slowly. A gradual increase of about 5 more grams (2 slices of whole-wheat bread, 2 servings of most fruits or vegetables, or 1 bowl of high-fiber cereal) per day is best. Too rapid an increase in fiber may result in constipation, flatulence, and bloating.   Drink enough water and fluids to keep your urine clear or pale yellow. Water, juice, or caffeine-free drinks are recommended. Not drinking enough fluid may cause constipation.   Eat a variety of high-fiber foods rather than one type of fiber.   Try to increase your intake of fiber through using high-fiber foods rather than fiber pills or supplements that contain small amounts of fiber.   The goal is to change the types of food eaten. Do not supplement your present diet with high-fiber foods, but replace foods in your present diet.   INCLUDE A VARIETY OF FIBER SOURCES  Replace refined and processed grains with whole grains, canned fruits with fresh fruits, and incorporate other fiber sources. White rice, white breads, and most bakery goods contain little or no fiber.   Brown whole-grain rice, buckwheat oats, and many fruits and vegetables are all good sources of fiber. These include: broccoli,  Brussels sprouts, cabbage, cauliflower, beets, sweet potatoes, white potatoes (skin on), carrots, tomatoes, eggplant, squash, berries, fresh fruits, and dried fruits.   Cereals appear to be the richest source of fiber. Cereal fiber is found in whole grains and bran. Bran is the fiber-rich outer coat of cereal grain, which is largely removed in refining. In whole-grain cereals, the bran remains. In breakfast cereals, the largest amount of fiber is found in those with "bran" in their names. The fiber content is sometimes indicated on the label.   You may need to include additional fruits and vegetables each day.   In baking, for 1 cup white flour, you may use the following substitutions:   1 cup whole-wheat flour minus 2 tablespoons.   1/2 cup white flour plus 1/2 cup whole-wheat flour.    Polyps, Colon  A polyp is extra tissue that grows inside your body. Colon polyps grow in the large intestine. The large intestine, also called the colon, is part of your digestive system. It is a long, hollow tube at the end of your  digestive tract where your body makes and stores stool. Most polyps are not dangerous. They are benign. This means they are not cancerous. But over time, some types of polyps can turn into cancer. Polyps that are smaller than a pea are usually not harmful. But larger polyps could someday become or may already be cancerous. To be safe, doctors remove all polyps and test them.    PREVENTION There is not one sure way to prevent polyps. You might be able to lower your risk of getting them if you:  Eat more fruits and vegetables and less fatty food.   Do not smoke.   Avoid alcohol.   Exercise every day.   Lose weight if you are overweight.   Eating more calcium and folate can also lower your risk of getting polyps. Some foods that are rich in calcium are milk, cheese, and broccoli. Some foods that are rich in folate are chickpeas, kidney beans, and spinach.    Diverticulosis Diverticulosis is a common condition that develops when small pouches (diverticula) form in the wall of the colon. The risk of diverticulosis increases with age. It happens more often in people who eat a low-fiber diet. Most individuals with diverticulosis have no symptoms. Those individuals with symptoms usually experience belly (abdominal) pain, constipation, or loose stools (diarrhea).  HOME CARE INSTRUCTIONS  Increase the amount of fiber in your diet as directed by your caregiver or dietician. This may reduce symptoms of diverticulosis.   Drink at least 6 to 8 glasses of water each day to prevent constipation.   Try not to strain when you have a bowel movement.   Avoiding nuts and seeds to prevent complications is NOT NECESSARY.    SEEK IMMEDIATE MEDICAL CARE IF:  You develop increasing pain or severe bloating.   You have an oral temperature above 101F.   You develop vomiting or bowel movements that are bloody or black.

## 2017-10-21 ENCOUNTER — Telehealth: Payer: Self-pay | Admitting: Gastroenterology

## 2017-10-21 NOTE — Telephone Encounter (Signed)
Please call pt. She had FOUR simple adenomas, ONE SERRATED POLYP, AND ONE HYPERPLASTIC POLYPS REMOVED.    IF YOU NEED AN MRI, LET THE RADIOLOGY TECH KNOW THAT YOU HAD THREE CLIPS PLACED IN YOUR RIGHT COLON. THEY SHOULD FALL OFF IN 30 DAYS.  FOLLOW A HIGH FIBER/LOW FAT DIET. AVOID ITEMS THAT CAUSE BLOATING.   TO PREVENT ULCERS AND GASTRITIS, START PROTONIX. TAKE 30 MINUTES PRIOR TO YOUR FIRST MEAL DAILY.  FOLLOW UP IN 4 MOS, DX: RUQ PAIN/DYSPHAGIA.  Next colonoscopy in 3 years. YOUR SISTERS, BROTHERS, CHILDREN, AND PARENTS NEED TO HAVE A COLONOSCOPY STARTING AT THE AGE OF 40.

## 2017-10-21 NOTE — Telephone Encounter (Signed)
PATIENT SCHEDULED AND ON RECALL  °

## 2017-10-21 NOTE — Telephone Encounter (Signed)
Pt notified of results. Pt is scheduled for her next appt.

## 2017-10-23 ENCOUNTER — Encounter (HOSPITAL_COMMUNITY): Payer: Self-pay | Admitting: Gastroenterology

## 2017-10-29 ENCOUNTER — Ambulatory Visit: Payer: Medicare HMO | Admitting: Physician Assistant

## 2017-11-04 ENCOUNTER — Ambulatory Visit: Payer: Medicare HMO | Admitting: Physician Assistant

## 2017-11-04 ENCOUNTER — Encounter: Payer: Self-pay | Admitting: Family Medicine

## 2017-11-04 ENCOUNTER — Ambulatory Visit (INDEPENDENT_AMBULATORY_CARE_PROVIDER_SITE_OTHER): Payer: Medicare HMO | Admitting: Family Medicine

## 2017-11-04 ENCOUNTER — Other Ambulatory Visit: Payer: Self-pay

## 2017-11-04 VITALS — BP 136/84 | HR 66 | Temp 98.6°F | Resp 16 | Ht 67.5 in | Wt 147.0 lb

## 2017-11-04 DIAGNOSIS — D485 Neoplasm of uncertain behavior of skin: Secondary | ICD-10-CM | POA: Diagnosis not present

## 2017-11-04 DIAGNOSIS — D489 Neoplasm of uncertain behavior, unspecified: Secondary | ICD-10-CM | POA: Diagnosis not present

## 2017-11-04 DIAGNOSIS — Z23 Encounter for immunization: Secondary | ICD-10-CM | POA: Diagnosis not present

## 2017-11-04 DIAGNOSIS — F17200 Nicotine dependence, unspecified, uncomplicated: Secondary | ICD-10-CM

## 2017-11-04 DIAGNOSIS — L821 Other seborrheic keratosis: Secondary | ICD-10-CM | POA: Diagnosis not present

## 2017-11-04 MED ORDER — ALBUTEROL SULFATE (2.5 MG/3ML) 0.083% IN NEBU: 2.5000 mg | INHALATION_SOLUTION | Freq: Four times a day (QID) | RESPIRATORY_TRACT | 1 refills | Status: DC | PRN

## 2017-11-04 NOTE — Progress Notes (Signed)
Patient ID: Patricia Jones, female    DOB: 20-Jul-1955, 62 y.o.   MRN: [id number]  PCP: Orlena Sheldon, PA-C  Chief Complaint  Patient presents with  . Referrals  . Mole    has mole to back that has changed    Subjective:   Patricia Jones is a 62 y.o. female, presents to clinic with CC of skin lesion to back, concern with dark color and changes - per her daughter.  She has scattered spots and raised skin lesions that of gradually developed in various places of her body over the past 2 decades.  On her back she believes she has scratched off at least 2 similar skin lesions.  She wishes to have it removed.  She is also concerned about her history of smoking and she reports that in the past couple months she has noticed at [facility] that her SpO2 has been low, 90-91%.  States that it is also drop while she is talking.  She also is concerned about sleep apnea and inability to wear CPAP machine.  Sleep apnea is managed by her neurologist.  She is concerned that she has some lung disease or COPD.  She does get short of breath occasionally with activity and a few times a year will get bronchitis.  Admits that she uses her daughter's inhaler occassionally.  Currently does not have any shortness of breath, wheeze, URI symptoms or coughing.   Patient Active Problem List   Diagnosis Date Noted  . Cecal polyp   . Hyperplastic polyp of ascending colon   . Erosive gastritis   . Esophageal dysphagia 08/11/2017  . Change in bowel function 08/11/2017  . RUQ pain 08/11/2017  . Thyroid goiter 05/18/2017  . Smoker 03/08/2014  . Sleep disorder due to a general medical condition, insomnia type 11/03/2013  . Migraine without status migrainosus 11/03/2013  . Hypersomnia with sleep apnea 11/03/2013  . Fibromyalgia 11/03/2013  . Idiopathic peripheral neuropathy 11/03/2013  . Diabetes mellitus, type 2 ([place]) 11/03/2013  . Overactive bladder 11/03/2013  . Lumbar radiculopathy 11/03/2013  .  Hyperglycemia 02/14/2013  . Chronic migraine without aura, with intractable migraine, so stated, without mention of status migrainosus 12/08/2012  . Lumbago 12/08/2012  . Myalgia and myositis, unspecified 12/08/2012  . Osteoporosis 12/03/2010  . Tobacco dependence   . Insomnia   . GERD (gastroesophageal reflux disease) 07/11/2010  . Hx of colonic polyps 07/11/2010  . Hepatitis C 07/11/2010  . HEPATITIS C 10/24/2009  . ACHILLES TENDINITIS 10/24/2009  . COLLES' FRACTURE, LEFT 05/10/2008     Prior to Admission medications   Medication Sig Start Date End Date Taking? Authorizing Provider  AIMOVIG 70 MG/ML SOAJ Inject 70 mg into the skin every 30 (thirty) days.  04/22/17  Yes [provider]  amitriptyline (ELAVIL) 10 MG tablet Take 20 mg by mouth at bedtime.  05/12/17  Yes [provider]  baclofen (LIORESAL) 10 MG tablet Take 10 mg by mouth 3 (three) times daily as needed for muscle spasms.    Yes [provider]  buprenorphine (SUBUTEX) 2 MG SUBL SL tablet Place 2-4 mg under the tongue daily as needed (for pain).  09/16/17  Yes [provider]  butalbital-acetaminophen-caffeine (FIORICET, ESGIC) 50-325-40 MG per tablet Take 1-2 tablets by mouth every 4 (four) hours as needed for headache or migraine.  07/09/10  Yes [provider]  Calcium Carbonate-Vitamin D (SM CALCIUM 500/VITAMIN D3 PO) Take 1 tablet by mouth daily.  Yes [provider]  Cholecalciferol (VITAMIN D) 2000 UNITS tablet Take 2,000 Units by mouth daily.   Yes [provider]  diazepam (VALIUM) 10 MG tablet Take 10 mg by mouth at bedtime as needed for anxiety.  06/15/15  Yes [provider]  FLUoxetine (PROZAC) 40 MG capsule Take 40 mg by mouth at bedtime.  06/17/10  Yes [provider]  gabapentin (NEURONTIN) 600 MG tablet Take 600 mg by mouth 3 (three) times daily as needed (for pain).    Yes [provider]  pantoprazole (PROTONIX) 40 MG  tablet 1 po 30 mins prior to first meal 10/20/17  Yes Fields, Marga Melnick, MD  SUMAtriptan (IMITREX) 6 MG/0.5ML SOLN Inject 6 mg into the skin every 2 (two) hours as needed for migraine or headache.  07/09/10  Yes [provider]  traMADol (ULTRAM) 50 MG tablet Take 50-100 mg by mouth every 6 (six) hours as needed for moderate pain.  06/15/15  Yes [provider]     Allergies  Allergen Reactions  . Sulfonamide Derivatives Rash     Family History  Problem Relation Age of Onset  . Aneurysm Mother   . Heart disease Mother   . Coronary artery disease Father   . Stroke Father   . Heart disease Father   . Cervical cancer Sister   . Ovarian cancer Sister   . Colon cancer Maternal Grandmother        greater than age 60. was her great grandmother  . Diabetes Maternal Aunt      Social History   Socioeconomic History  . Marital status: Divorced    Spouse name: Not on file  . Number of children: 2  . Years of education: Not on file  . Highest education level: Not on file  Occupational History  . Occupation: disabled     Comment: previously Therapist, sports @ [employer]  . Financial resource strain: Not on file  . Food insecurity:    Worry: Not on file    Inability: Not on file  . Transportation needs:    Medical: Not on file    Non-medical: Not on file  Tobacco Use  . Smoking status: Current Every Day Smoker    Packs/day: 0.50    Years: 40.00    Pack years: 20.00    Types: Cigarettes  . Smokeless tobacco: Never Used  Substance and Sexual Activity  . Alcohol use: No    Comment: heavy etoh x 30 yrs, quit 14 yrs ago  . Drug use: Yes    Types: Marijuana    Comment: maijuana use-occ  . Sexual activity: Yes    Partners: Male    Comment: [facility]  Lifestyle  . Physical activity:    Days per week: Not on file    Minutes per session: Not on file  . Stress: Not on file  Relationships  . Social connections:    Talks on phone: Not on file    Gets together: Not on file     Attends religious service: Not on file    Active member of club or organization: Not on file    Attends meetings of clubs or organizations: Not on file    Relationship status: Not on file  . Intimate partner violence:    Fear of current or ex partner: Not on file    Emotionally abused: Not on file    Physically abused: Not on file    Forced sexual activity: Not on  file  Other Topics Concern  . Not on file  Social History Narrative  . Not on file     Review of Systems  Constitutional: Negative.   HENT: Negative.   Eyes: Negative.   Respiratory: Negative.   Cardiovascular: Negative.   Gastrointestinal: Negative.   Endocrine: Negative.   Genitourinary: Negative.   Musculoskeletal: Negative.   Skin: Negative.   Allergic/Immunologic: Negative.   Neurological: Negative.   Hematological: Negative.   Psychiatric/Behavioral: Negative.   All other systems reviewed and are negative.      Objective:    Vitals:   11/04/17 1202  BP: 136/84  Pulse: 66  Resp: 16  Temp: 98.6 F (37 C)  TempSrc: Oral  SpO2: 94%  Weight: 147 lb (66.7 kg)  Height: 5' 7.5" (1.715 m)      Physical Exam  Constitutional: She is oriented to person, place, and time. She appears well-developed and well-nourished. No distress.  HENT:  Head: Normocephalic and atraumatic.  Nose: Nose normal.  Eyes: Conjunctivae are normal. Right eye exhibits no discharge. Left eye exhibits no discharge.  Neck: Normal range of motion. Neck supple. No tracheal deviation present.  Cardiovascular: Normal rate, regular rhythm, normal heart sounds and intact distal pulses.  Pulmonary/Chest: Effort normal. No stridor. No respiratory distress. She has no wheezes. She has no rales.  Musculoskeletal: Normal range of motion.  Neurological: She is alert and oriented to person, place, and time. She exhibits normal muscle tone. Coordination normal.  Skin: Skin is warm and dry. Lesion noted. No rash noted. She is not diaphoretic.    Few abrasions to left upper back 3x54mm dark brown raised flat patch, dry, irregular edges  Psychiatric: She has a normal mood and affect. Her behavior is normal.  Nursing note and vitals reviewed.      Shave Biopsy Procedure Note:  After verbal informed consent was obtained, pt agrees to procedure after reviewing procedure, risks, SE, the skin was prepped with alcohol and betadine x3; local anesthesia was obtained with 1% lidocaine with epinephrine, 1 mLs.    A shave biopsy into the dermis was performed with a razor and hemostasis was achieved with direct pressure and then dry-sol.  The biopsy specimen was submitted to pathology in formalin. A dressing was applied. Wound instructions were given. There were no complications. The patient tolerated the procedure well.        Assessment & Plan:      ICD-10-CM   1. Neoplasm of uncertain behavior G62.6 Pathology Report  2. Tobacco dependence F17.200   3. Need for immunization against influenza Z23 Flu Vaccine QUAD 36+ mos IM    Patient with skin lesion to her back, do suspect SK, shave biopsy done- sent to pathology.  Patient has multiple concerns about possible lung disease or COPD.  I did listen to her lungs, reviewed recent chest x-ray from May 2019 which did show hyperinflation and signs of emphysema, will order PFTs and have patient return for a visit dedicated to this.  She does also have concerns about her sleep apnea machine not working however this is managed by the specialist and I have advised her to follow-up with him regarding sleep apnea and CPAP.   Patient is currently not currently have any signs or symptoms concerning for a COPD exacerbation or acute bronchitis and her vital signs are stable and lungs clear.  Pt is current smoker, 1/2 ppd with over 40 years smoking hx, desires to quit, somewhat hesitant about chantix with hx  of psych and mood dx.  Spent 5 min discussing tobacco use/Smoking cessation instruction/counseling  given:  counseled patient on the dangers of tobacco use, advised patient to stop smoking, and reviewed strategies to maximize success  Pt currently does not feel ready to completely quit, she believes getting PFT's done and finding out about lung disease might "scare her" or make it real enough for her to have the motivation to quit completely.  We will get PFTs and have a follow-up visit dedicated to this more fully, to address concerns about any mood disorders and possibility of trying Chantix  Flu shot given.   Delsa Grana, PA-C 11/04/17 12:18 PM

## 2017-11-04 NOTE — Patient Instructions (Signed)
Will order the PFT's and after you get them done call us to set up a follow up appointment.  Shave biopsy - we will call you with results.   Skin Biopsy, Care After Refer to this sheet in the next few weeks. These instructions provide you with information about caring for yourself after your procedure. Your health care provider may also give you more specific instructions. Your treatment has been planned according to current medical practices, but problems sometimes occur. Call your health care provider if you have any problems or questions after your procedure. What can I expect after the procedure? After the procedure, it is common to have:  Soreness.  Bruising.  Itching.  Follow these instructions at home:  Rest and then return to your normal activities as told by your health care provider.  Take over-the-counter and prescription medicines only as told by your health care provider.  Follow instructions from your health care provider about how to take care of your biopsy site.Make sure you: ? Wash your hands with soap and water before you change your bandage (dressing). If soap and water are not available, use hand sanitizer. ? Change your dressing as told by your health care provider. ? Leave stitches (sutures), skin glue, or adhesive strips in place. These skin closures may need to stay in place for 2 weeks or longer. If adhesive strip edges start to loosen and curl up, you may trim the loose edges. Do not remove adhesive strips completely unless your health care provider tells you to do that. If the biopsy area bleeds, apply gentle pressure for 10 minutes.  Check your biopsy site every day for signs of infection. Check for: ? More redness, swelling, or pain. ? More fluid or blood. ? Warmth. ? Pus or a bad smell.  Keep all follow-up visits as told by your health care provider. This is important. Contact a health care provider if:  You have more redness, swelling, or pain  around your biopsy site.  You have more fluid or blood coming from your biopsy site.  Your biopsy site feels warm to the touch.  You have pus or a bad smell coming from your biopsy site.  You have a fever. Get help right away if:  You have bleeding that does not stop with pressure or a dressing. This information is not intended to replace advice given to you by your health care provider. Make sure you discuss any questions you have with your health care provider. Document Released: 01/26/2015 Document Revised: 08/26/2015 Document Reviewed: 03/29/2014 Elsevier Interactive Patient Education  [employer].

## 2017-11-06 LAB — TISSUE SPECIMEN

## 2017-11-06 LAB — PATHOLOGY

## 2017-11-18 ENCOUNTER — Ambulatory Visit (HOSPITAL_COMMUNITY)
Admission: RE | Admit: 2017-11-18 | Discharge: 2017-11-18 | Disposition: A | Discharge status: Home / Self Care | Payer: Medicare HMO | Source: Ambulatory Visit | Attending: Family Medicine | Admitting: Family Medicine

## 2017-11-18 DIAGNOSIS — F17200 Nicotine dependence, unspecified, uncomplicated: Secondary | ICD-10-CM

## 2017-11-18 DIAGNOSIS — F17210 Nicotine dependence, cigarettes, uncomplicated: Secondary | ICD-10-CM | POA: Insufficient documentation

## 2017-11-18 DIAGNOSIS — R0600 Dyspnea, unspecified: Secondary | ICD-10-CM | POA: Diagnosis not present

## 2017-11-18 LAB — PULMONARY FUNCTION TEST
DL/VA % PRED: 53 %
DL/VA: 2.79 ml/min/mmHg/L
DLCO UNC: 13.49 ml/min/mmHg
DLCO unc % pred: 45 %
FEF 25-75 Post: 1.27 L/sec
FEF 25-75 Pre: 1.73 L/sec
FEF2575-%Change-Post: -26 %
FEF2575-%Pred-Post: 48 %
FEF2575-%Pred-Pre: 66 %
FEV1-%Change-Post: -5 %
FEV1-%PRED-PRE: 81 %
FEV1-%Pred-Post: 76 %
FEV1-POST: 2.26 L
FEV1-PRE: 2.39 L
FEV1FVC-%Change-Post: 0 %
FEV1FVC-%PRED-PRE: 94 %
FEV6-%Change-Post: -5 %
FEV6-%PRED-PRE: 87 %
FEV6-%Pred-Post: 82 %
FEV6-POST: 3.05 L
FEV6-PRE: 3.24 L
FEV6FVC-%CHANGE-POST: 0 %
FEV6FVC-%PRED-PRE: 102 %
FEV6FVC-%Pred-Post: 102 %
FVC-%Change-Post: -5 %
FVC-%PRED-PRE: 85 %
FVC-%Pred-Post: 80 %
FVC-POST: 3.08 L
FVC-PRE: 3.26 L
POST FEV6/FVC RATIO: 99 %
PRE FEV6/FVC RATIO: 99 %
Post FEV1/FVC ratio: 73 %
Pre FEV1/FVC ratio: 73 %
RV % PRED: 114 %
RV: 2.5 L
TLC % PRED: 94 %
TLC: 5.32 L

## 2017-11-18 MED ORDER — ALBUTEROL SULFATE (2.5 MG/3ML) 0.083% IN NEBU
2.5000 mg | INHALATION_SOLUTION | Freq: Once | RESPIRATORY_TRACT | Status: AC
  Administered 2017-11-18: 2.5 mg via RESPIRATORY_TRACT

## 2017-12-01 ENCOUNTER — Encounter: Payer: Self-pay | Admitting: Family Medicine

## 2017-12-01 ENCOUNTER — Ambulatory Visit (INDEPENDENT_AMBULATORY_CARE_PROVIDER_SITE_OTHER): Payer: Medicare HMO | Admitting: Family Medicine

## 2017-12-01 VITALS — BP 120/80 | HR 78 | Temp 98.0°F | Ht 67.5 in | Wt 142.0 lb

## 2017-12-01 DIAGNOSIS — K047 Periapical abscess without sinus: Secondary | ICD-10-CM

## 2017-12-01 DIAGNOSIS — F17200 Nicotine dependence, unspecified, uncomplicated: Secondary | ICD-10-CM | POA: Diagnosis not present

## 2017-12-01 DIAGNOSIS — R0609 Other forms of dyspnea: Secondary | ICD-10-CM | POA: Diagnosis not present

## 2017-12-01 MED ORDER — ALBUTEROL SULFATE HFA 108 (90 BASE) MCG/ACT IN AERS: 2.0000 | INHALATION_SPRAY | RESPIRATORY_TRACT | 2 refills | Status: DC | PRN

## 2017-12-01 MED ORDER — PENICILLIN V POTASSIUM 500 MG PO TABS: 500.0000 mg | ORAL_TABLET | Freq: Four times a day (QID) | ORAL | 0 refills | Status: AC

## 2017-12-01 NOTE — Progress Notes (Signed)
Patient ID: Patricia Jones, female    DOB: 1955-11-05, 62 y.o.   MRN: [id number]  PCP: Patricia Grana, PA-C  Chief Complaint  Patient presents with  . Results    Patient in today to discuss PFT results. Also has concerns of abscess teeth    Subjective:   Patricia Jones is a 62 y.o. female, presents to clinic with CC of relation for suspected COPD per the patient with her extensive smoking history.  She mentioned this at a previous visit which was for skin lesion with procedure for removal.  Did offer PFTs to be ordered but explained at that time that she would have to come back for an office visit dedicated to the history of the complaint of gradually worsening breathing.  Patient reports that she is been smoking about 1 pack/day for 44 years plus.  She has noticed only doctors visits that her oxygen saturation has been low about 91 to 92%.  Over the last year she has become short of breath with exertion such as walking up stairs or up steep hills and she has not had any shortness of breath at rest.  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  She reports having 5 dogs and a cat at home although she has no known allergy to her own animals and she also reports seasonal allergies.  Over the past year she has used inhalers when having worsening nasal allergies or a URI which typically causes worsening cough, sputum production wheeze and shortness of breath.  Right lower jaw swollen and painful, pain started yesterday, swelling this am, pain severe, not improved with maximum allowed use of her narcotic pain medicine.  She has diffuse dental decay and needs extensive dental work and extractions but she cannot afford it and has last tried to see dentist a couple years ago.  She did not have any trauma or avulsion or crack to previously diseased teeth, she  has not felt any drainage in her mouth.  Lungs located to her right central jaw and radiates up her cheek pain is located to that area and across her right face right temple and right ear.  Additional pain, swelling or redness down neck.  Denies fever, chills, sweats, nausea, vomiting.  Denies trismus, difficulty speaking, swallowing or breathing.  Patient states that she is managed by neurology and pain management who also manages OSA, is on Subutex, Ultram, muscle relaxers.   Patient Active Problem List   Diagnosis Date Noted  . Cecal polyp   . Hyperplastic polyp of ascending colon   . Erosive gastritis   . Esophageal dysphagia 08/11/2017  . Change in bowel function 08/11/2017  . RUQ pain 08/11/2017  . Thyroid goiter 05/18/2017  . Smoker 03/08/2014  . Sleep disorder due to a general medical condition, insomnia type 11/03/2013  . Migraine without status migrainosus 11/03/2013  . Hypersomnia with sleep apnea 11/03/2013  . Fibromyalgia 11/03/2013  . Idiopathic peripheral neuropathy 11/03/2013  . Diabetes mellitus, type 2 ([place]) 11/03/2013  . Overactive bladder 11/03/2013  . Lumbar radiculopathy 11/03/2013  . Hyperglycemia 02/14/2013  . Chronic migraine without aura, with intractable migraine, so stated, without mention of status migrainosus 12/08/2012  . Lumbago 12/08/2012  . Myalgia and myositis, unspecified 12/08/2012  . Osteoporosis 12/03/2010  . Tobacco dependence   . Insomnia   . GERD (  gastroesophageal reflux disease) 07/11/2010  . Hx of colonic polyps 07/11/2010  . Hepatitis C 07/11/2010  . HEPATITIS C 10/24/2009  . ACHILLES TENDINITIS 10/24/2009  . COLLES' FRACTURE, LEFT 05/10/2008     Prior to Admission medications   Medication Sig Start Date End Date Taking? Authorizing Provider  AIMOVIG 70 MG/ML SOAJ Inject 70 mg into the skin every 30 (thirty) days.  04/22/17  Yes [provider]  albuterol (PROVENTIL) (2.5 MG/3ML) 0.083% nebulizer solution Take 3 mLs (2.5  mg total) by nebulization every 6 (six) hours as needed for wheezing or shortness of breath. 11/04/17  Yes Patricia Grana, PA-C  amitriptyline (ELAVIL) 10 MG tablet Take 20 mg by mouth at bedtime.  05/12/17  Yes [provider]  baclofen (LIORESAL) 10 MG tablet Take 10 mg by mouth 3 (three) times daily as needed for muscle spasms.    Yes [provider]  buprenorphine (SUBUTEX) 2 MG SUBL SL tablet Place 2-4 mg under the tongue daily as needed (for pain).  09/16/17  Yes [provider]  butalbital-acetaminophen-caffeine (FIORICET, ESGIC) 50-325-40 MG per tablet Take 1-2 tablets by mouth every 4 (four) hours as needed for headache or migraine.  07/09/10  Yes [provider]  Calcium Carbonate-Vitamin D (SM CALCIUM 500/VITAMIN D3 PO) Take 1 tablet by mouth daily.   Yes [provider]  Cholecalciferol (VITAMIN D) 2000 UNITS tablet Take 2,000 Units by mouth daily.   Yes [provider]  diazepam (VALIUM) 10 MG tablet Take 10 mg by mouth at bedtime as needed for anxiety.  06/15/15  Yes [provider]  FLUoxetine (PROZAC) 40 MG capsule Take 40 mg by mouth at bedtime.  06/17/10  Yes [provider]  gabapentin (NEURONTIN) 600 MG tablet Take 600 mg by mouth 3 (three) times daily as needed (for pain).    Yes [provider]  pantoprazole (PROTONIX) 40 MG tablet 1 po 30 mins prior to first meal 10/20/17  Yes Fields, Marga Melnick, MD  SUMAtriptan (IMITREX) 6 MG/0.5ML SOLN Inject 6 mg into the skin every 2 (two) hours as needed for migraine or headache.  07/09/10  Yes [provider]  traMADol (ULTRAM) 50 MG tablet Take 50-100 mg by mouth every 6 (six) hours as needed for moderate pain.  06/15/15  Yes [provider]     Allergies  Allergen Reactions  . Sulfonamide Derivatives Rash     Family History  Problem Relation Age of Onset  . Aneurysm Mother   . Heart disease Mother   . Coronary artery disease Father   . Stroke  Father   . Heart disease Father   . Cervical cancer Sister   . Ovarian cancer Sister   . Colon cancer Maternal Grandmother        greater than age 47. was her great grandmother  . Diabetes Maternal Aunt      Social History   Socioeconomic History  . Marital status: Divorced    Spouse name: Not on file  . Number of children: 2  . Years of education: Not on file  . Highest education level: Not on file  Occupational History  . Occupation: disabled     Comment: previously Therapist, sports @ [employer]  . Financial resource strain: Not on file  . Food insecurity:    Worry: Not on file    Inability: Not on file  . Transportation needs:    Medical: Not on file    Non-medical: Not on file  Tobacco Use  . Smoking status: Current Every Day Smoker    Packs/day: 0.50    Years: 40.00    Pack years: 20.00    Types: Cigarettes  . Smokeless tobacco: Never Used  Substance and Sexual Activity  . Alcohol use: No    Comment: heavy etoh x 30 yrs, quit 14 yrs ago  . Drug use: Yes    Types: Marijuana    Comment: maijuana use-occ  . Sexual activity: Yes    Partners: Male    Comment: TAH  Lifestyle  . Physical activity:    Days per week: Not on file    Minutes per session: Not on file  . Stress: Not on file  Relationships  . Social connections:    Talks on phone: Not on file    Gets together: Not on file    Attends religious service: Not on file    Active member of club or organization: Not on file    Attends meetings of clubs or organizations: Not on file    Relationship status: Not on file  . Intimate partner violence:    Fear of current or ex partner: Not on file    Emotionally abused: Not on file    Physically abused: Not on file    Forced sexual activity: Not on file  Other Topics Concern  . Not on file  Social History Narrative  . Not on file     Review of Systems  Constitutional: Negative.   HENT: Negative.   Eyes: Negative.   Respiratory: Negative.     Cardiovascular: Negative.   Gastrointestinal: Negative.   Endocrine: Negative.   Genitourinary: Negative.   Musculoskeletal: Negative.   Skin: Negative.   Allergic/Immunologic: Negative.   Neurological: Negative.   Hematological: Negative.   Psychiatric/Behavioral: Negative.   All other systems reviewed and are negative.      Objective:    Vitals:   12/01/17 1415  BP: 120/80  Pulse: 78  Temp: 98 F (36.7 C)  TempSrc: Oral  SpO2: 98%  Weight: 142 lb (64.4 kg)  Height: 5' 7.5" (1.715 m)       Physical Exam  Constitutional: She appears well-developed and well-nourished.  Non-toxic appearance. No distress.  Very tired appearing female Chronically ill, appears stated age  No distress  HENT:  Head: Normocephalic and atraumatic.  Right Ear: External ear normal.  Left Ear: External ear normal.  Nose: Nose normal.  Mouth/Throat: Uvula is midline, oropharynx is clear and moist and mucous membranes are normal. No oropharyngeal exudate.  Eyes: Pupils are equal, round, and reactive to light. Conjunctivae, EOM and lids are normal. No scleral icterus.  Neck: Normal range of motion and phonation normal. Neck supple. No tracheal deviation present.  Cardiovascular: Normal rate, regular rhythm, normal heart sounds and normal pulses. Exam reveals no gallop and no friction rub.  No murmur heard. Pulses:      Radial pulses are 2+ on the right side, and 2+ on the left side.       Posterior tibial pulses are 2+ on the right side, and 2+ on the left side.  Pulmonary/Chest: Effort normal and breath sounds normal. No stridor. No respiratory distress. She has no wheezes. She has no rhonchi. She has no rales. She exhibits no tenderness.  Abdominal: Soft. Normal appearance and bowel sounds are normal.  Musculoskeletal: Normal range of motion. She exhibits no edema or deformity.  Lymphadenopathy:    She has no cervical adenopathy.  Neurological: She is  alert. Gait normal.  Skin: Skin is  warm, dry and intact. Capillary refill takes less than 2 seconds. No rash noted. She is not diaphoretic. No pallor.  Psychiatric: She has a normal mood and affect. Her speech is normal and behavior is normal.  Nursing note and vitals reviewed.         Assessment & Plan:      ICD-10-CM   1. Tobacco dependence F17.200 Ambulatory referral to Pulmonology  2. Exertional dyspnea R06.09 albuterol (PROVENTIL HFA;VENTOLIN HFA) 108 (90 Base) MCG/ACT inhaler    Ambulatory referral to Pulmonology   suspect COPD - PFT's done - trial of breo maintenance inhaler, advised tobacco cessation, refer to pulmonology   3. Abscess, periapical K04.7 penicillin v potassium (VEETID) 500 MG tablet    Referred to pulmonology - PFT's done showed mixed results and advised need to additional tests.    Patricia Grana, PA-C 12/01/17 2:21 PM

## 2017-12-01 NOTE — Patient Instructions (Addendum)
Close follow up - call me Friday if not starting to get better, may need to change antibiotic  If any swelling or redness tracks down your neck or you have trouble opening and closing your mouth, trouble breathing, speaking or swallowing that is new - go to the ER or call 911  Try the Breo maintenance inhaler daily and albuterol as needed.  Stopping smoking is the best thing you can do for your lungs. Breo - 1 puff, two times a day.  Swish and spit  Will refer you to pulmonology

## 2017-12-08 DIAGNOSIS — M545 Low back pain: Secondary | ICD-10-CM | POA: Diagnosis not present

## 2017-12-08 DIAGNOSIS — Z79891 Long term (current) use of opiate analgesic: Secondary | ICD-10-CM | POA: Diagnosis not present

## 2017-12-08 DIAGNOSIS — G43701 Chronic migraine without aura, not intractable, with status migrainosus: Secondary | ICD-10-CM | POA: Diagnosis not present

## 2017-12-08 DIAGNOSIS — M542 Cervicalgia: Secondary | ICD-10-CM | POA: Diagnosis not present

## 2017-12-25 ENCOUNTER — Encounter: Payer: Self-pay | Admitting: Pulmonary Disease

## 2017-12-25 ENCOUNTER — Ambulatory Visit: Payer: Medicare HMO | Admitting: Pulmonary Disease

## 2017-12-25 DIAGNOSIS — J431 Panlobular emphysema: Secondary | ICD-10-CM

## 2017-12-25 MED ORDER — VARENICLINE TARTRATE 0.5 MG PO TABS: ORAL_TABLET | ORAL | 2 refills | Status: DC

## 2017-12-25 NOTE — Progress Notes (Signed)
Patricia Jones    [id number]    [birth year]  Primary Care Physician:Tapia, Florina Ou  Referring Physician: Delsa Grana, PA-C [phone] [place] [address]  Chief complaint:   Shortness of breath, active smoker  HPI:  Patient with a chronic history of smoking, shortness of breath on exertion Denies any chest pains or chest discomfort She is limited with activities of daily living  Has smoked for many years she was able to quit for about a year in the past  Denies any significant weight loss Does have occasional cough No wheezing History of hemoptysis  Smokes about a pack a day at present  Denies any family history of lung disease  Outpatient Encounter Medications as of 12/25/2017  Medication Sig  . AIMOVIG 70 MG/ML SOAJ Inject 70 mg into the skin every 30 (thirty) days.   Marland Kitchen albuterol (PROVENTIL HFA;VENTOLIN HFA) 108 (90 Base) MCG/ACT inhaler Inhale 2 puffs into the lungs every 4 (four) hours as needed for wheezing or shortness of breath.  Marland Kitchen albuterol (PROVENTIL) (2.5 MG/3ML) 0.083% nebulizer solution Take 3 mLs (2.5 mg total) by nebulization every 6 (six) hours as needed for wheezing or shortness of breath.  Marland Kitchen amitriptyline (ELAVIL) 10 MG tablet Take 20 mg by mouth at bedtime.   . baclofen (LIORESAL) 10 MG tablet Take 10 mg by mouth 3 (three) times daily as needed for muscle spasms.   . buprenorphine (SUBUTEX) 2 MG SUBL SL tablet Place 2-4 mg under the tongue daily as needed (for pain).   . butalbital-acetaminophen-caffeine (FIORICET, ESGIC) 50-325-40 MG per tablet Take 1-2 tablets by mouth every 4 (four) hours as needed for headache or migraine.   . Calcium Carbonate-Vitamin D (SM CALCIUM 500/VITAMIN D3 PO) Take 1 tablet by mouth daily.  . Cholecalciferol (VITAMIN D) 2000 UNITS tablet Take 2,000 Units by mouth daily.  . diazepam (VALIUM) 10 MG tablet Take 10 mg by mouth at bedtime as needed for anxiety.   Marland Kitchen FLUoxetine (PROZAC) 40 MG capsule Take 40  mg by mouth at bedtime.   . gabapentin (NEURONTIN) 600 MG tablet Take 600 mg by mouth 3 (three) times daily as needed (for pain).   . pantoprazole (PROTONIX) 40 MG tablet 1 po 30 mins prior to first meal  . SUMAtriptan (IMITREX) 6 MG/0.5ML SOLN Inject 6 mg into the skin every 2 (two) hours as needed for migraine or headache.   . traMADol (ULTRAM) 50 MG tablet Take 50-100 mg by mouth every 6 (six) hours as needed for moderate pain.   . varenicline (CHANTIX) 0.5 MG tablet 0.5mg  once a day  for 3days, Then take 0.5mg  twice a day for 3 days, Then 1mg  twice a day .   No facility-administered encounter medications on file as of 12/25/2017.     Allergies as of 12/25/2017 - Review Complete 12/25/2017  Allergen Reaction Noted  . Sulfonamide derivatives Rash 05/10/2008    Past Medical History:  Diagnosis Date  . Allergy   . Chronic migraine without aura, with intractable migraine, so stated, without mention of status migrainosus 12/08/2012  . Depression   . DJD (degenerative joint disease)   . Fibromyalgia   . GERD (gastroesophageal reflux disease)   . Hepatitis C 1980   symptomatic w/ jaundice, NEGATIVE HCV RNA  08/2005, positive hepatitis C antibody but negative HCVRNA in 2019  . Hypersomnia with sleep apnea, unspecified   . Insomnia   . Lumbago 12/08/2012  . Migraines   .  Myalgia and myositis, unspecified 12/08/2012  . Overactive bladder   . S/P colonoscopy 10/29/1999   Dr Arita Miss polyps-hyperplastic  . S/P colonoscopy 09/12/2005   4 hyperplastic polyps  . Sleep apnea    CPAP  . Tobacco dependence   . Type II or unspecified type diabetes mellitus without mention of complication, not stated as uncontrolled   . Unspecified hereditary and idiopathic peripheral neuropathy     Past Surgical History:  Procedure Laterality Date  . ABDOMINAL HYSTERECTOMY  10/30/1999   TAH/BSO  . COLONOSCOPY     2007, 2001, hyperplastic polyps  . COLONOSCOPY WITH PROPOFOL N/A 10/20/2017   Procedure:  COLONOSCOPY WITH PROPOFOL;  Surgeon: Danie Binder, MD;  [facility];  Service: Endoscopy;  Laterality: N/A;  10:45am  . complete hyst  2001  . ESOPHAGOGASTRODUODENOSCOPY (EGD) WITH PROPOFOL N/A 10/20/2017   Procedure: ESOPHAGOGASTRODUODENOSCOPY (EGD) WITH PROPOFOL;  Surgeon: Danie Binder, MD;  [facility];  Service: Endoscopy;  Laterality: N/A;  . EXPLORATORY LAPAROTOMY     Ruptured ovarian cyst with hemorrhage in her 43s  . FOOT SURGERY Left   . HIP SURGERY     laproscopic left  . PELVIC LAPAROSCOPY     Multiple for ovarian cysts and endometriosis, infertility issues  . POLYPECTOMY  10/20/2017   Procedure: POLYPECTOMY;  Surgeon: Danie Binder, MD;  [facility]: [facility];  Service: Endoscopy;;  . SAVORY DILATION N/A 10/20/2017   Procedure: SAVORY DILATION;  Surgeon: Danie Binder, MD;  [facility];  Service: Endoscopy;  Laterality: N/A;  . SINUS SURGERY WITH INSTATRAK      Family History  Problem Relation Age of Onset  . Aneurysm Mother   . Heart disease Mother   . Coronary artery disease Father   . Stroke Father   . Heart disease Father   . Cervical cancer Sister   . Ovarian cancer Sister   . Colon cancer Maternal Grandmother        greater than age 44. was her great grandmother  . Diabetes Maternal Aunt     Social History   Socioeconomic History  . Marital status: Divorced    Spouse name: Not on file  . Number of children: 2  . Years of education: Not on file  . Highest education level: Not on file  Occupational History  . Occupation: disabled     Comment: previously Therapist, sports @ [employer]  . Financial resource strain: Not on file  . Food insecurity:    Worry: Not on file    Inability: Not on file  . Transportation needs:    Medical: Not on file    Non-medical: Not on file  Tobacco Use  . Smoking status: Current Every Day Smoker    Packs/day: 2.00    Years: 40.00    Pack years: 80.00    Types: Cigarettes    Start  date: 12/25/1973  . Smokeless tobacco: Never Used  Substance and Sexual Activity  . Alcohol use: No    Comment: heavy etoh x 30 yrs, quit 14 yrs ago  . Drug use: Yes    Types: Marijuana    Comment: maijuana use-occ  . Sexual activity: Yes    Partners: Male    Comment: TAH  Lifestyle  . Physical activity:    Days per week: Not on file    Minutes per session: Not on file  . Stress: Not on file  Relationships  . Social connections:  Talks on phone: Not on file    Gets together: Not on file    Attends religious service: Not on file    Active member of club or organization: Not on file    Attends meetings of clubs or organizations: Not on file    Relationship status: Not on file  . Intimate partner violence:    Fear of current or ex partner: Not on file    Emotionally abused: Not on file    Physically abused: Not on file    Forced sexual activity: Not on file  Other Topics Concern  . Not on file  Social History Narrative  . Not on file    Review of Systems  Constitutional: Negative.   HENT: Negative.   Eyes: Negative.   Respiratory: Positive for cough and shortness of breath.   Cardiovascular: Negative.  Negative for chest pain, palpitations and leg swelling.  Gastrointestinal: Negative.   Endocrine: Negative.   All other systems reviewed and are negative.   Vitals:   12/25/17 1647  BP: 112/64  Pulse: 93  SpO2: 93%    Physical Exam  Constitutional: She is oriented to person, place, and time. She appears well-developed and well-nourished.  HENT:  Head: Normocephalic and atraumatic.  Eyes: Pupils are equal, round, and reactive to light. Conjunctivae are normal. Right eye exhibits no discharge. Left eye exhibits no discharge.  Neck: Normal range of motion. Neck supple. No tracheal deviation present. No thyromegaly present.  Cardiovascular: Normal rate and regular rhythm.  Pulmonary/Chest: Effort normal and breath sounds normal. No respiratory distress. She has no  wheezes. She has no rales. She exhibits no tenderness.  Abdominal: Soft. Bowel sounds are normal. She exhibits no distension. There is no abdominal tenderness.  Musculoskeletal:        General: No edema.  Neurological: She is alert and oriented to person, place, and time. No cranial nerve deficit.  Skin: Skin is warm and dry. No erythema.   Data Reviewed:  06/10/17-significant for emphysema  Assessment:  Shortness of breath  Active smoker  Chronic obstructive pulmonary disease  Abnormal chest x-ray showing evidence of emphysema  Abnormal PFT with low DLCO likely related to emphysema  Plan/Recommendations:  She is willing to try to quit and try Chantix  Continue bronchodilators  The importance of quitting smoking was discussed extensively  We will obtain a CT scan of the chest or evaluate for extent of emphysema  I will see her back in the office in about 3 months  Encouraged to call with any significant concerns     Sherrilyn Rist MD [facility] 12/25/2017, 5:22 PM  CC: Delsa Grana, PA-C

## 2017-12-25 NOTE — Patient Instructions (Signed)
COPD Active smoker  Willing to consider Chantix  We will obtain a CT scan of the chest to assess extent of emphysema Recent PFT showing diffusing capacity less than 50%  I will see you back in the office in about 3 months

## 2017-12-31 ENCOUNTER — Telehealth: Payer: Self-pay | Admitting: Family Medicine

## 2017-12-31 MED ORDER — FLUTICASONE FUROATE-VILANTEROL 100-25 MCG/INH IN AEPB: 1.0000 | INHALATION_SPRAY | Freq: Every day | RESPIRATORY_TRACT | 2 refills | Status: DC

## 2017-12-31 NOTE — Telephone Encounter (Signed)
Patient would like a prescription called in for her Memory Dance that she was given samples of at her last office visit called into [place].  CB# [phone]

## 2017-12-31 NOTE — Telephone Encounter (Signed)
I sent in [place], I cannot see what the pulmonologist wanted her to do for maintenance?  But sent in Breo higher dose once daily with a refills

## 2018-01-01 NOTE — Telephone Encounter (Signed)
Spoke with patient and informed her that we sent in [place]. Patient verbalized understanding.

## 2018-01-15 ENCOUNTER — Ambulatory Visit (HOSPITAL_COMMUNITY): Payer: Medicare HMO

## 2018-01-25 ENCOUNTER — Ambulatory Visit (HOSPITAL_COMMUNITY): Payer: Medicare HMO

## 2018-02-02 ENCOUNTER — Ambulatory Visit (INDEPENDENT_AMBULATORY_CARE_PROVIDER_SITE_OTHER): Payer: Medicare HMO | Admitting: Family Medicine

## 2018-02-02 ENCOUNTER — Ambulatory Visit (HOSPITAL_COMMUNITY)
Admission: RE | Admit: 2018-02-02 | Discharge: 2018-02-02 | Disposition: A | Discharge status: Home / Self Care | Payer: Medicare HMO | Source: Ambulatory Visit | Attending: Family Medicine | Admitting: Family Medicine

## 2018-02-02 ENCOUNTER — Encounter: Payer: Self-pay | Admitting: Family Medicine

## 2018-02-02 VITALS — BP 116/76 | HR 62 | Temp 98.0°F | Wt 144.0 lb

## 2018-02-02 DIAGNOSIS — S2231XA Fracture of one rib, right side, initial encounter for closed fracture: Secondary | ICD-10-CM | POA: Diagnosis not present

## 2018-02-02 DIAGNOSIS — R0789 Other chest pain: Secondary | ICD-10-CM | POA: Diagnosis not present

## 2018-02-02 DIAGNOSIS — Y92009 Unspecified place in unspecified non-institutional (private) residence as the place of occurrence of the external cause: Secondary | ICD-10-CM | POA: Diagnosis not present

## 2018-02-02 DIAGNOSIS — W19XXXA Unspecified fall, initial encounter: Secondary | ICD-10-CM

## 2018-02-02 DIAGNOSIS — S299XXA Unspecified injury of thorax, initial encounter: Secondary | ICD-10-CM | POA: Diagnosis not present

## 2018-02-02 DIAGNOSIS — S20211A Contusion of right front wall of thorax, initial encounter: Secondary | ICD-10-CM

## 2018-02-02 NOTE — Progress Notes (Signed)
Patient ID: Patricia Jones, female    DOB: 10/21/1955, 63 y.o.   MRN: [id number]  PCP: Delsa Grana, PA-C  Chief Complaint  Patient presents with  . Fall    Fall occured 1 week ago.  Has pain to right side.    Subjective:   Patricia Jones is a 63 y.o. female, presents to clinic with CC of pain to right side and ribs s/p fall one week ago.  She was climbing out of her bed, she says there is a lot of stuff crowded around the corner of her bed, and she tried to climb over it, but her foot got stuck and she fell forward, flipping and somehow hit her right side on the 90 degree corner of wood dresser, described "like a 2x4." She had some bruising below the area that she thinks she hit, she is sure she broke some ribs. It is sore from right anterior and lateral ribs to her right hip bone.  Worse with coughing.  She has tried to remind herself to take deep breaths so she doesn't get pneumonia, but she will find herself breathing really shallow.  Pain is moderate to severe, constant, worse with movement, palpation, deep breathing or coughing, and no alleviating factors.   She denies fever, chills, sweats, SOB, wheeze.  Cough is chronic smokers cough, no change from normal.   No bowel changes.  No hematuria.  No N/V/D.   Patient Active Problem List   Diagnosis Date Noted  . Cecal polyp   . Hyperplastic polyp of ascending colon   . Erosive gastritis   . Esophageal dysphagia 08/11/2017  . Change in bowel function 08/11/2017  . RUQ pain 08/11/2017  . Thyroid goiter 05/18/2017  . Smoker 03/08/2014  . Sleep disorder due to a general medical condition, insomnia type 11/03/2013  . Migraine without status migrainosus 11/03/2013  . Hypersomnia with sleep apnea 11/03/2013  . Fibromyalgia 11/03/2013  . Idiopathic peripheral neuropathy 11/03/2013  . Diabetes mellitus, type 2 ([place]) 11/03/2013  . Overactive bladder 11/03/2013  . Lumbar radiculopathy 11/03/2013  . Hyperglycemia 02/14/2013  .  Chronic migraine without aura, with intractable migraine, so stated, without mention of status migrainosus 12/08/2012  . Lumbago 12/08/2012  . Myalgia and myositis, unspecified 12/08/2012  . Osteoporosis 12/03/2010  . Tobacco dependence   . Insomnia   . GERD (gastroesophageal reflux disease) 07/11/2010  . Hx of colonic polyps 07/11/2010  . Hepatitis C 07/11/2010  . HEPATITIS C 10/24/2009  . ACHILLES TENDINITIS 10/24/2009  . COLLES' FRACTURE, LEFT 05/10/2008     Prior to Admission medications   Medication Sig Start Date End Date Taking? Authorizing Provider  AIMOVIG 70 MG/ML SOAJ Inject 70 mg into the skin every 30 (thirty) days.  04/22/17  Yes [provider]  albuterol (PROVENTIL HFA;VENTOLIN HFA) 108 (90 Base) MCG/ACT inhaler Inhale 2 puffs into the lungs every 4 (four) hours as needed for wheezing or shortness of breath. 12/01/17  Yes Delsa Grana, PA-C  albuterol (PROVENTIL) (2.5 MG/3ML) 0.083% nebulizer solution Take 3 mLs (2.5 mg total) by nebulization every 6 (six) hours as needed for wheezing or shortness of breath. 11/04/17  Yes Delsa Grana, PA-C  amitriptyline (ELAVIL) 10 MG tablet Take 20 mg by mouth at bedtime.  05/12/17  Yes [provider]  baclofen (LIORESAL) 10 MG tablet Take 10 mg by mouth 3 (three) times daily as needed for muscle spasms.    Yes [provider]  buprenorphine (SUBUTEX) 2 MG SUBL  SL tablet Place 2-4 mg under the tongue daily as needed (for pain).  09/16/17  Yes [provider]  butalbital-acetaminophen-caffeine (FIORICET, ESGIC) 50-325-40 MG per tablet Take 1-2 tablets by mouth every 4 (four) hours as needed for headache or migraine.  07/09/10  Yes [provider]  Calcium Carbonate-Vitamin D (SM CALCIUM 500/VITAMIN D3 PO) Take 1 tablet by mouth daily.   Yes [provider]  Cholecalciferol (VITAMIN D) 2000 UNITS tablet Take 2,000 Units by mouth daily.   Yes [provider]  diazepam (VALIUM) 10 MG  tablet Take 10 mg by mouth at bedtime as needed for anxiety.  06/15/15  Yes [provider]  FLUoxetine (PROZAC) 40 MG capsule Take 40 mg by mouth at bedtime.  06/17/10  Yes [provider]  fluticasone furoate-vilanterol (BREO ELLIPTA) 100-25 MCG/INH AEPB Inhale 1 puff into the lungs daily. 12/31/17  Yes Delsa Grana, PA-C  gabapentin (NEURONTIN) 600 MG tablet Take 600 mg by mouth 3 (three) times daily as needed (for pain).    Yes [provider]  pantoprazole (PROTONIX) 40 MG tablet 1 po 30 mins prior to first meal 10/20/17  Yes Fields, Marga Melnick, MD  SUMAtriptan (IMITREX) 6 MG/0.5ML SOLN Inject 6 mg into the skin every 2 (two) hours as needed for migraine or headache.  07/09/10  Yes [provider]  traMADol (ULTRAM) 50 MG tablet Take 50-100 mg by mouth every 6 (six) hours as needed for moderate pain.  06/15/15  Yes [provider]  varenicline (CHANTIX) 0.5 MG tablet 0.5mg  once a day  for 3days, Then take 0.5mg  twice a day for 3 days, Then 1mg  twice a day . 12/25/17  Yes Laurin Coder, MD     Allergies  Allergen Reactions  . Sulfonamide Derivatives Rash     Family History  Problem Relation Age of Onset  . Aneurysm Mother   . Heart disease Mother   . Coronary artery disease Father   . Stroke Father   . Heart disease Father   . Cervical cancer Sister   . Ovarian cancer Sister   . Colon cancer Maternal Grandmother        greater than age 20. was her great grandmother  . Diabetes Maternal Aunt      Social History   Socioeconomic History  . Marital status: Divorced    Spouse name: Not on file  . Number of children: 2  . Years of education: Not on file  . Highest education level: Not on file  Occupational History  . Occupation: disabled     Comment: previously Therapist, sports @ [employer]  . Financial resource strain: Not on file  . Food insecurity:    Worry: Not on file    Inability: Not on file  . Transportation needs:    Medical:  Not on file    Non-medical: Not on file  Tobacco Use  . Smoking status: Current Every Day Smoker    Packs/day: 2.00    Years: 40.00    Pack years: 80.00    Types: Cigarettes    Start date: 12/25/1973  . Smokeless tobacco: Never Used  Substance and Sexual Activity  . Alcohol use: No    Comment: heavy etoh x 30 yrs, quit 14 yrs ago  . Drug use: Yes    Types: Marijuana    Comment: maijuana use-occ  . Sexual activity: Yes    Partners: Male    Comment: TAH  Lifestyle  . Physical activity:  Days per week: Not on file    Minutes per session: Not on file  . Stress: Not on file  Relationships  . Social connections:    Talks on phone: Not on file    Gets together: Not on file    Attends religious service: Not on file    Active member of club or organization: Not on file    Attends meetings of clubs or organizations: Not on file    Relationship status: Not on file  . Intimate partner violence:    Fear of current or ex partner: Not on file    Emotionally abused: Not on file    Physically abused: Not on file    Forced sexual activity: Not on file  Other Topics Concern  . Not on file  Social History Narrative  . Not on file     Review of Systems  Constitutional: Negative.   HENT: Negative.   Eyes: Negative.   Respiratory: Negative.   Cardiovascular: Negative.   Gastrointestinal: Negative.   Endocrine: Negative.   Genitourinary: Negative.   Musculoskeletal: Negative.   Skin: Negative.   Allergic/Immunologic: Negative.   Neurological: Negative.   Hematological: Negative.   Psychiatric/Behavioral: Negative.   All other systems reviewed and are negative.      Objective:    Vitals:   02/02/18 1129  BP: 116/76  Pulse: 62  Temp: 98 F (36.7 C)  TempSrc: Oral  SpO2: 98%  Weight: 144 lb (65.3 kg)      Physical Exam Vitals signs and nursing note reviewed.  Constitutional:      General: She is not in acute distress.    Appearance: She is well-developed. She  is not toxic-appearing or diaphoretic.     Comments: Disheveled with poor hygiene, nontoxic appearing  HENT:     Head: Normocephalic and atraumatic.     Right Ear: External ear normal.     Left Ear: External ear normal.     Nose: Nose normal.     Mouth/Throat:     Mouth: Mucous membranes are moist.     Pharynx: Oropharynx is clear.  Eyes:     General:        Right eye: No discharge.        Left eye: No discharge.     Conjunctiva/sclera: Conjunctivae normal.  Neck:     Trachea: No tracheal deviation.  Cardiovascular:     Rate and Rhythm: Normal rate and regular rhythm.     Pulses: Normal pulses.     Heart sounds: Normal heart sounds.  Pulmonary:     Effort: Pulmonary effort is normal. No respiratory distress.     Breath sounds: No stridor. Examination of the right-lower field reveals rales. Rales present. No wheezing or rhonchi.     Comments: Fine rales with initial 1-2 breaths to RLL with anterior auscultation, posterior and further auscultation no rales Chest:     Chest wall: Tenderness present.  Abdominal:     General: Bowel sounds are normal. There is no distension.     Palpations: Abdomen is soft.     Tenderness: There is abdominal tenderness. There is no right CVA tenderness or left CVA tenderness.       Comments: In red is area ttp, small bruise to purple area circled  Musculoskeletal: Normal range of motion.  Skin:    General: Skin is warm and dry.     Capillary Refill: Capillary refill takes less than 2 seconds.     Coloration:  Skin is not jaundiced or pale.     Findings: Bruising present. No erythema or rash.  Neurological:     Mental Status: She is alert.     Motor: No abnormal muscle tone.     Coordination: Coordination normal.  Psychiatric:        Behavior: Behavior normal.           Assessment & Plan:      ICD-10-CM   1. Contusion of rib on right side, initial encounter S20.211A DG Ribs Unilateral Right    DG Chest 2 View  2. Fall at home,  initial encounter W19.XXXA    Y92.009     Contusion to right side occurred about 1 week ago there is bruise in the right lower side of the abdomen tenderness diffusely to right abdominal wall and right anterior lateral ribs, no crepitus, deformity, flail chest.  Some fine rales with initial auscultation of right lower lung fields which he did not hear again with posterior auscultation.  Will check for rib fractures with right rib unilateral films and chest x-ray two-view to rule out pneumonia, encourage deep inspiration, pulmonary hygiene, decrease smoking.  Patient is on pain management, will have to continue to manage pain with available narcotics.   Delsa Grana, PA-C 02/02/18 11:40 AM

## 2018-02-04 ENCOUNTER — Ambulatory Visit (HOSPITAL_COMMUNITY)
Admission: RE | Admit: 2018-02-04 | Discharge: 2018-02-04 | Disposition: A | Discharge status: Home / Self Care | Payer: Medicare HMO | Source: Ambulatory Visit | Attending: Pulmonary Disease | Admitting: Pulmonary Disease

## 2018-02-04 DIAGNOSIS — J431 Panlobular emphysema: Secondary | ICD-10-CM | POA: Insufficient documentation

## 2018-02-04 DIAGNOSIS — J432 Centrilobular emphysema: Secondary | ICD-10-CM | POA: Diagnosis not present

## 2018-02-08 DIAGNOSIS — Z79891 Long term (current) use of opiate analgesic: Secondary | ICD-10-CM | POA: Diagnosis not present

## 2018-02-08 DIAGNOSIS — G43701 Chronic migraine without aura, not intractable, with status migrainosus: Secondary | ICD-10-CM | POA: Diagnosis not present

## 2018-02-08 DIAGNOSIS — M542 Cervicalgia: Secondary | ICD-10-CM | POA: Diagnosis not present

## 2018-02-08 DIAGNOSIS — M545 Low back pain: Secondary | ICD-10-CM | POA: Diagnosis not present

## 2018-02-12 ENCOUNTER — Other Ambulatory Visit: Payer: Self-pay | Admitting: Pulmonary Disease

## 2018-02-12 MED ORDER — VARENICLINE TARTRATE 1 MG PO TABS: ORAL_TABLET | ORAL | 1 refills | Status: DC

## 2018-02-12 NOTE — Progress Notes (Signed)
Thank you - goiter/nodules is known, surveillance managed by endocrinology

## 2018-02-16 ENCOUNTER — Ambulatory Visit: Payer: Medicare HMO | Admitting: Family Medicine

## 2018-02-17 ENCOUNTER — Encounter: Payer: Self-pay | Admitting: Family Medicine

## 2018-02-23 ENCOUNTER — Ambulatory Visit: Payer: Medicare HMO | Admitting: Gastroenterology

## 2018-03-08 ENCOUNTER — Ambulatory Visit: Payer: Medicare HMO | Admitting: Family Medicine

## 2018-03-09 ENCOUNTER — Encounter: Payer: Self-pay | Admitting: Family Medicine

## 2018-03-09 ENCOUNTER — Ambulatory Visit (HOSPITAL_COMMUNITY)
Admission: RE | Admit: 2018-03-09 | Discharge: 2018-03-09 | Disposition: A | Discharge status: Home / Self Care | Payer: Medicare HMO | Source: Ambulatory Visit | Attending: Family Medicine | Admitting: Family Medicine

## 2018-03-09 ENCOUNTER — Ambulatory Visit (INDEPENDENT_AMBULATORY_CARE_PROVIDER_SITE_OTHER): Payer: Medicare HMO | Admitting: Family Medicine

## 2018-03-09 VITALS — BP 100/70 | HR 75 | Temp 98.1°F | Resp 14 | Ht 67.5 in | Wt 141.4 lb

## 2018-03-09 DIAGNOSIS — W108XXS Fall (on) (from) other stairs and steps, sequela: Secondary | ICD-10-CM | POA: Diagnosis not present

## 2018-03-09 DIAGNOSIS — M79672 Pain in left foot: Secondary | ICD-10-CM | POA: Insufficient documentation

## 2018-03-09 DIAGNOSIS — Z79899 Other long term (current) drug therapy: Secondary | ICD-10-CM | POA: Diagnosis not present

## 2018-03-09 DIAGNOSIS — S99922A Unspecified injury of left foot, initial encounter: Secondary | ICD-10-CM | POA: Diagnosis not present

## 2018-03-09 NOTE — Progress Notes (Signed)
Patient ID: Patricia Jones, female    DOB: [birth year], 63 y.o.   MRN: [id number]  PCP: Delsa Grana, PA-C  Chief Complaint  Patient presents with  . Fall    Patient came in today with c/o of fall that occured on sunday. Has c/o of right foot pain and toe pain.    Subjective:   Patricia Jones is a 63 y.o. female, presents to clinic with CC of left foot pain s/p injury, onset Sunday, 2 days ago.  Mechanism of injury, she was walking down stairs carrying her old dog and her left toe caught and she fell forward, bending her foot (hyperplantar flexing foot/toes), she slid down 1-2 more stairs, ended up landing on her bottom.   Pain was sudden onset, throbbing, severe.  She could not immediately weight bear.  No obvious deformity, top of left foot was bruised, then became swollen. Pain has been continuous, but gradually getting better Described as aches, throbs Rated 5/10, has slowly been able to limp on it, she used a walker to get around Also has iced, elevated, has helped with pain.  Pain and swelling decreasing. Still signs of bruising. Pain with with movement, palpation and walking.  No numbness, tingling.  No ankle pain or sx in toes.     Patient Active Problem List   Diagnosis Date Noted  . Cecal polyp   . Hyperplastic polyp of ascending colon   . Erosive gastritis   . Esophageal dysphagia 08/11/2017  . Change in bowel function 08/11/2017  . RUQ pain 08/11/2017  . Thyroid goiter 05/18/2017  . Smoker 03/08/2014  . Sleep disorder due to a general medical condition, insomnia type 11/03/2013  . Migraine without status migrainosus 11/03/2013  . Hypersomnia with sleep apnea 11/03/2013  . Fibromyalgia 11/03/2013  . Idiopathic peripheral neuropathy 11/03/2013  . Diabetes mellitus, type 2 ([place]) 11/03/2013  . Overactive bladder 11/03/2013  . Lumbar radiculopathy 11/03/2013  . Hyperglycemia 02/14/2013  . Chronic migraine without aura, with intractable migraine, so stated, without  mention of status migrainosus 12/08/2012  . Lumbago 12/08/2012  . Myalgia and myositis, unspecified 12/08/2012  . Osteoporosis 12/03/2010  . Tobacco dependence   . Insomnia   . GERD (gastroesophageal reflux disease) 07/11/2010  . Hx of colonic polyps 07/11/2010  . Hepatitis C 07/11/2010  . HEPATITIS C 10/24/2009  . ACHILLES TENDINITIS 10/24/2009  . COLLES' FRACTURE, LEFT 05/10/2008     Prior to Admission medications   Medication Sig Start Date End Date Taking? Authorizing Provider  AIMOVIG 70 MG/ML SOAJ Inject 70 mg into the skin every 30 (thirty) days.  04/22/17   [provider]  albuterol (PROVENTIL HFA;VENTOLIN HFA) 108 (90 Base) MCG/ACT inhaler Inhale 2 puffs into the lungs every 4 (four) hours as needed for wheezing or shortness of breath. 12/01/17   Delsa Grana, PA-C  albuterol (PROVENTIL) (2.5 MG/3ML) 0.083% nebulizer solution Take 3 mLs (2.5 mg total) by nebulization every 6 (six) hours as needed for wheezing or shortness of breath. 11/04/17   Delsa Grana, PA-C  amitriptyline (ELAVIL) 10 MG tablet Take 20 mg by mouth at bedtime.  05/12/17   [provider]  baclofen (LIORESAL) 10 MG tablet Take 10 mg by mouth 3 (three) times daily as needed for muscle spasms.     [provider]  buprenorphine (SUBUTEX) 2 MG SUBL SL tablet Place 2-4 mg under the tongue daily as needed (for pain).  09/16/17   [provider]  butalbital-acetaminophen-caffeine ([employer], ESGIC)  50-325-40 MG per tablet Take 1-2 tablets by mouth every 4 (four) hours as needed for headache or migraine.  07/09/10   [provider]  Calcium Carbonate-Vitamin D (SM CALCIUM 500/VITAMIN D3 PO) Take 1 tablet by mouth daily.    [provider]  Cholecalciferol (VITAMIN D) 2000 UNITS tablet Take 2,000 Units by mouth daily.    [provider]  diazepam (VALIUM) 10 MG tablet Take 10 mg by mouth at bedtime as needed for anxiety.  06/15/15   [provider]    FLUoxetine (PROZAC) 40 MG capsule Take 40 mg by mouth at bedtime.  06/17/10   [provider]  fluticasone furoate-vilanterol (BREO ELLIPTA) 100-25 MCG/INH AEPB Inhale 1 puff into the lungs daily. 12/31/17   Delsa Grana, PA-C  gabapentin (NEURONTIN) 600 MG tablet Take 600 mg by mouth 3 (three) times daily as needed (for pain).     [provider]  pantoprazole (PROTONIX) 40 MG tablet 1 po 30 mins prior to first meal 10/20/17   Fields, Marga Melnick, MD  SUMAtriptan (IMITREX) 6 MG/0.5ML SOLN Inject 6 mg into the skin every 2 (two) hours as needed for migraine or headache.  07/09/10   [provider]  traMADol (ULTRAM) 50 MG tablet Take 50-100 mg by mouth every 6 (six) hours as needed for moderate pain.  06/15/15   [provider]  varenicline (CHANTIX) 1 MG tablet 1mg  twice a day . 02/12/18   Laurin Coder, MD     Allergies  Allergen Reactions  . Sulfonamide Derivatives Rash     Family History  Problem Relation Age of Onset  . Aneurysm Mother   . Heart disease Mother   . Coronary artery disease Father   . Stroke Father   . Heart disease Father   . Cervical cancer Sister   . Ovarian cancer Sister   . Colon cancer Maternal Grandmother        greater than age 49. was her great grandmother  . Diabetes Maternal Aunt      Social History   Socioeconomic History  . Marital status: Divorced    Spouse name: Not on file  . Number of children: 2  . Years of education: Not on file  . Highest education level: Not on file  Occupational History  . 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 Financial resource strain: Not on file  . Food insecurity:    Worry: Not on file    Inability: Not on file  . Transportation needs:    Medical: Not on file    Non-medical: Not on file  Tobacco Use  . Smoking status: Current Every Day Smoker    Packs/day: 2.00    Years: 40.00    Pack years: 80.00    Types: Cigarettes    Start date:  12/25/1973  . Smokeless tobacco: Never Used  Substance and Sexual Activity  . Alcohol use: No    Comment: heavy etoh x 30 yrs, quit 14 yrs ago  . Drug use: Yes    Types: Marijuana    Comment: maijuana use-occ  . Sexual activity: Yes    Partners: Male    Comment: TAH  Lifestyle  . Physical activity:    Days per week: Not on file    Minutes per session: Not on file  . Stress: Not on file  Relationships  . Social connections:    Talks on phone: Not on file  Gets together: Not on file    Attends religious service: Not on file    Active member of club or organization: Not on file    Attends meetings of clubs or organizations: Not on file    Relationship status: Not on file  . Intimate partner violence:    Fear of current or ex partner: Not on file    Emotionally abused: Not on file    Physically abused: Not on file    Forced sexual activity: Not on file  Other Topics Concern  . Not on file  Social History Narrative  . Not on file     Review of Systems  Constitutional: Negative.   HENT: Negative.   Eyes: Negative.   Respiratory: Negative.   Cardiovascular: Negative.   Gastrointestinal: Negative.   Endocrine: Negative.   Genitourinary: Negative.   Musculoskeletal: Negative.   Skin: Negative.   Allergic/Immunologic: Negative.   Neurological: Negative.   Hematological: Negative.   Psychiatric/Behavioral: Negative.   All other systems reviewed and are negative.      Objective:    Vitals:   03/09/18 1519  BP: 100/70  Pulse: 75  Resp: 14  Temp: 98.1 F (36.7 C)  TempSrc: Oral  SpO2: 94%  Weight: 141 lb 6 oz (64.1 kg)  Height: 5' 7.5" (1.715 m)      Physical Exam Vitals signs and nursing note reviewed.  Constitutional:      Appearance: She is well-developed.  HENT:     Head: Normocephalic and atraumatic.     Nose: Nose normal.  Eyes:     General:        Right eye: No discharge.        Left eye: No discharge.     Conjunctiva/sclera: Conjunctivae  normal.  Neck:     Trachea: No tracheal deviation.  Cardiovascular:     Rate and Rhythm: Normal rate and regular rhythm.  Pulmonary:     Effort: Pulmonary effort is normal. No respiratory distress.     Breath sounds: No stridor.  Musculoskeletal: Normal range of motion.     Right ankle: She exhibits normal range of motion, no swelling, no ecchymosis, no deformity, no laceration and normal pulse. No lateral malleolus and no medial malleolus tenderness found. Achilles tendon normal.     Left foot: Normal range of motion and normal capillary refill. Tenderness, bony tenderness and swelling present. No crepitus or deformity.     Comments: Left foot with bruising and edema to proximal dorsal aspect ttp to prox 5th metatarsal  Skin:    General: Skin is warm and dry.     Findings: No rash.  Neurological:     Mental Status: She is alert.     Sensory: Sensation is intact.     Motor: No abnormal muscle tone.     Coordination: Coordination normal.     Gait: Gait normal.     Comments: Normal gait, was able to demonstrate fall using exam table as "stairs" fell to the ground and then independently got up off the ground, and then from crawling position on hands and knees got back up on exam table to placing left foot (bare foot) on exam step and pushing all weight up, rotating and sat back down on exam table. Normal gait w/o antalgia  Psychiatric:        Behavior: Behavior normal.           Assessment & Plan:      ICD-10-CM   1. Left  foot pain M79.672 DG Foot Complete Left    Hyperflexion injurty with pain to dorsum of left foot and most ttp to left proximal 5th metatarsal.  Xrays ordered, pt encouraged tx with RICE, continue to use walker.   Delsa Grana, PA-C 03/09/18 6:55 PM

## 2018-03-09 NOTE — Patient Instructions (Signed)
RICE Therapy for Routine Care of Injuries  Many injuries can be cared for with rest, ice, compression, and elevation (RICE therapy). This includes:   Resting the injured part.   Putting ice on the injury.   Putting pressure (compression) on the injury.   Raising the injured part (elevation).  Using RICE therapy can help to lessen pain and swelling.  Supplies needed:   Ice.   Plastic bag.   Towel.   Elastic bandage.   Pillow or pillows to raise (elevate) your injured body part.  How to care for your injury with RICE therapy  Rest  Limit your normal activities, and try not to use the injured part of your body. You can go back to your normal activities when your doctor says it is okay to do them and you feel okay. Ask your doctor if you should do exercises to help your injury get better.  Ice  Put ice on the injured area. Do not put ice on your bare skin.   Put ice in a plastic bag.   Place a towel between your skin and the bag.   Leave the ice on for 20 minutes, 2-3 times a day. Use ice on as many days as told by your doctor.    Compression  Compression means putting pressure on the injured area. This can be done with an elastic bandage. If an elastic bandage has been put on your injury:   Do not wrap the bandage too tight. Wrap the bandage more loosely if part of your body away from the bandage is blue, swollen, cold, painful, or loses feeling (gets numb).   Take off the bandage and put it on again. Do this every 3-4 hours or as told by your doctor.   See your doctor if the bandage seems to make your problems worse.    Elevation  Elevation means keeping the injured area raised. If you can, raise the injured area above your heart or the center of your chest.  Contact a doctor if:   You keep having pain and swelling.   Your symptoms get worse.  Get help right away if:   You have sudden bad pain at your injury or lower than your injury.   You have redness or more swelling around your injury.   You  have tingling or numbness at your injury or lower than your injury, and it does not go away when you take off the bandage.  Summary   Many injuries can be cared for using rest, ice, compression, and elevation (RICE therapy).   You can go back to your normal activities when you feel okay and your doctor says it is okay.   Put ice on the injured area as told by your doctor.   Get help if your symptoms get worse or if you keep having pain and swelling.  This information is not intended to replace advice given to you by your health care provider. Make sure you discuss any questions you have with your health care provider.  Document Released: 06/18/2007 Document Revised: 09/19/2016 Document Reviewed: 09/19/2016  Elsevier Interactive Patient Education  2019 [employer]

## 2018-03-17 ENCOUNTER — Encounter: Payer: Self-pay | Admitting: Family Medicine

## 2018-03-24 ENCOUNTER — Other Ambulatory Visit: Payer: Self-pay | Admitting: Family Medicine

## 2018-03-24 DIAGNOSIS — E119 Type 2 diabetes mellitus without complications: Secondary | ICD-10-CM | POA: Diagnosis not present

## 2018-03-24 DIAGNOSIS — H52203 Unspecified astigmatism, bilateral: Secondary | ICD-10-CM | POA: Diagnosis not present

## 2018-03-24 DIAGNOSIS — H35371 Puckering of macula, right eye: Secondary | ICD-10-CM | POA: Diagnosis not present

## 2018-03-24 DIAGNOSIS — R0609 Other forms of dyspnea: Principal | ICD-10-CM

## 2018-03-24 DIAGNOSIS — H5203 Hypermetropia, bilateral: Secondary | ICD-10-CM | POA: Diagnosis not present

## 2018-03-24 LAB — HM DIABETES EYE EXAM

## 2018-04-07 ENCOUNTER — Ambulatory Visit: Payer: Medicare HMO | Admitting: Pulmonary Disease

## 2018-04-07 DIAGNOSIS — Z79891 Long term (current) use of opiate analgesic: Secondary | ICD-10-CM | POA: Diagnosis not present

## 2018-04-07 DIAGNOSIS — M545 Low back pain: Secondary | ICD-10-CM | POA: Diagnosis not present

## 2018-04-07 DIAGNOSIS — G43701 Chronic migraine without aura, not intractable, with status migrainosus: Secondary | ICD-10-CM | POA: Diagnosis not present

## 2018-04-07 DIAGNOSIS — M542 Cervicalgia: Secondary | ICD-10-CM | POA: Diagnosis not present

## 2018-04-15 ENCOUNTER — Ambulatory Visit: Payer: Medicare HMO | Admitting: Pulmonary Disease

## 2018-04-16 ENCOUNTER — Other Ambulatory Visit: Payer: Self-pay | Admitting: Family Medicine

## 2018-04-16 ENCOUNTER — Telehealth: Payer: Self-pay | Admitting: Family Medicine

## 2018-04-16 DIAGNOSIS — K047 Periapical abscess without sinus: Secondary | ICD-10-CM

## 2018-04-16 NOTE — Telephone Encounter (Signed)
Patient calling to ask if she can get a referral to oral surgeon if possible  [phone]

## 2018-04-16 NOTE — Telephone Encounter (Signed)
I can try and put in a referral attached to prior office visit with dental pain back in November, but I can't guarantee that we will be able to get anything done STAT.   If its a true emergency she needs to go to the ER where they have someone on call.   Please let her know  we can try, but not sure what will happen.   Thanks, Kristeen Miss

## 2018-04-16 NOTE — Telephone Encounter (Signed)
See note

## 2018-04-16 NOTE — Telephone Encounter (Signed)
Noted  

## 2018-04-16 NOTE — Telephone Encounter (Signed)
Oral surgeon referrals are often difficult to get.  And in general, if the pt has complaints and symptoms, she has to be seen for the issues in order for insurance to approve the referrals.  Because of COVID-19, and because I did document some dental issues in November - I was willing to put in the oral surgeon referral to see if she could get seen, but for other referrals or complaints, I would guess that we will have everything denied by her insurance unless we have a visit, exam, plan or treatment tried and documented before she can get to specialists.   WE will let her know what the insurance referral process leads to, but right now, a lot of referrals are not going through or are delayed.

## 2018-04-16 NOTE — Telephone Encounter (Signed)
Pt notified. Pt states that her teeth are all broken and some are broke to her gums. Pt explains that she has dry mouth due to sleep apnea and medications and believes that is the reason for her teeth issues. Pt also states that she wants a referral to a Dr (ENT) that did surgery on her daughter. I explained to pt, that is two different specialities and that an ENT could not help with pulling all of her teeth. Patricia Jones will try to put in a medical referral to an oral surgeon and see what happens. Patricia Jones advised pt to go to ED if it gets worst because they have a Dr on call at the ED.

## 2018-05-28 ENCOUNTER — Other Ambulatory Visit: Payer: Self-pay | Admitting: Family Medicine

## 2018-05-28 DIAGNOSIS — R0609 Other forms of dyspnea: Secondary | ICD-10-CM

## 2018-06-30 DIAGNOSIS — M542 Cervicalgia: Secondary | ICD-10-CM | POA: Diagnosis not present

## 2018-06-30 DIAGNOSIS — M545 Low back pain: Secondary | ICD-10-CM | POA: Diagnosis not present

## 2018-06-30 DIAGNOSIS — G4733 Obstructive sleep apnea (adult) (pediatric): Secondary | ICD-10-CM | POA: Diagnosis not present

## 2018-06-30 DIAGNOSIS — Z79891 Long term (current) use of opiate analgesic: Secondary | ICD-10-CM | POA: Diagnosis not present

## 2018-06-30 DIAGNOSIS — M797 Fibromyalgia: Secondary | ICD-10-CM | POA: Diagnosis not present

## 2018-07-05 ENCOUNTER — Ambulatory Visit: Payer: Medicare HMO | Admitting: Family Medicine

## 2018-07-05 ENCOUNTER — Other Ambulatory Visit: Payer: Self-pay

## 2018-07-05 ENCOUNTER — Ambulatory Visit (HOSPITAL_COMMUNITY)
Admission: RE | Admit: 2018-07-05 | Discharge: 2018-07-05 | Disposition: A | Discharge status: Home / Self Care | Payer: Medicare HMO | Source: Ambulatory Visit | Attending: Family Medicine | Admitting: Family Medicine

## 2018-07-05 ENCOUNTER — Other Ambulatory Visit: Payer: Self-pay | Admitting: *Deleted

## 2018-07-05 DIAGNOSIS — M79675 Pain in left toe(s): Secondary | ICD-10-CM

## 2018-07-05 DIAGNOSIS — H524 Presbyopia: Secondary | ICD-10-CM | POA: Diagnosis not present

## 2018-07-05 DIAGNOSIS — H52209 Unspecified astigmatism, unspecified eye: Secondary | ICD-10-CM | POA: Diagnosis not present

## 2018-07-05 DIAGNOSIS — H5203 Hypermetropia, bilateral: Secondary | ICD-10-CM | POA: Diagnosis not present

## 2018-07-05 DIAGNOSIS — S92512A Displaced fracture of proximal phalanx of left lesser toe(s), initial encounter for closed fracture: Secondary | ICD-10-CM | POA: Diagnosis not present

## 2018-07-06 ENCOUNTER — Encounter: Payer: Self-pay | Admitting: Family Medicine

## 2018-07-06 ENCOUNTER — Ambulatory Visit (INDEPENDENT_AMBULATORY_CARE_PROVIDER_SITE_OTHER): Payer: Medicare HMO | Admitting: Family Medicine

## 2018-07-06 VITALS — BP 106/88 | HR 82 | Temp 98.3°F | Resp 18 | Ht 67.5 in | Wt 148.0 lb

## 2018-07-06 DIAGNOSIS — S92512A Displaced fracture of proximal phalanx of left lesser toe(s), initial encounter for closed fracture: Secondary | ICD-10-CM

## 2018-07-06 DIAGNOSIS — H3321 Serous retinal detachment, right eye: Secondary | ICD-10-CM

## 2018-07-06 DIAGNOSIS — H31001 Unspecified chorioretinal scars, right eye: Secondary | ICD-10-CM | POA: Diagnosis not present

## 2018-07-06 DIAGNOSIS — H43811 Vitreous degeneration, right eye: Secondary | ICD-10-CM | POA: Diagnosis not present

## 2018-07-06 NOTE — Progress Notes (Signed)
   Subjective:    Patient ID: Patricia Jones, female    DOB: December 29, 1955, 63 y.o.   MRN: [id number]  Patient presents for Toe Pain (x-ray on 07/05/2018) and Visual Disturbances (constant disturbance in R eye on r isde of visual field. )  Pt here with injury to her left toe. Last  Friday she was walking in her granddaughter's room and kicked some toys she felt her toes go to the side which was her fifth digit on the left foot.  She does have history of surgery in the past for bunion she does have a screw she states in the great toe and the fifth digit. Had x-ray performed yesterday evening which did show mild displaced fracture of the proximal fifth toe  Past 3 weeks, sees a black circile that has a cut in it, when she moves her right eye she sees this lesion, it doesn't go away [facility] last saw her 6 months ago. Legally blind in left eye , always has double vision, has ambylopia  No injury to  Face or eye        Review Of Systems:  GEN- denies fatigue, fever, weight loss,weakness, recent illness HEENT- denies eye drainage, +change in vision, nasal discharge, CVS- denies chest pain, palpitations RESP- denies SOB, cough, wheeze ABD- denies N/V, change in stools, abd pain GU- denies dysuria, hematuria, dribbling, incontinence MSK- + joint pain, muscle aches, injury Neuro- denies headache, dizziness, syncope, seizure activity       Objective:    BP 106/88   Pulse 82   Temp 98.3 F (36.8 C) (Oral)   Resp 18   Ht 5' 7.5" (1.715 m)   Wt 148 lb (67.1 kg)   SpO2 92%   BMI 22.84 kg/m  GEN- NAD, alert and oriented x3 HEENT- PERRL, EOMI, non injected sclera, pink conjunctiva, MMM, oropharynx clear, decreased vision in bilat eyes Neck- Supple, no bruit, no LAD CVS- RRR, no murmur RESP-CTAB EXT- Bruising from 5th digit to mid foot on left foot, TTP, splaying of toe between 4th and 5th digit  Pulses- Radial, DP- 2+        Assessment & Plan:      Problem List  Items Addressed This Visit    None    Visit Diagnoses    Displaced fracture of proximal phalanx of left lesser toe(s), initial encounter for closed fracture    -  Primary   Referral to orthopedics, mild displaced but remote surgery , also has splaying of toe    Relevant Orders   Ambulatory referral to Orthopedic Surgery   Right retinal detachment       Concern for possible retinal detachment, urgent eye appt this afternoon      Note: This dictation was prepared with Dragon dictation along with smaller phrase technology. Any transcriptional errors that result from this process are unintentional.

## 2018-07-06 NOTE — Patient Instructions (Addendum)
Referral to ortho- 6/26 with Dr. Aline Brochure at 9:30am. F/U with Dr. Delman Cheadle today at 2:30pm

## 2018-07-09 ENCOUNTER — Ambulatory Visit: Payer: Medicare HMO | Admitting: Orthopedic Surgery

## 2018-07-12 ENCOUNTER — Encounter: Payer: Self-pay | Admitting: Orthopedic Surgery

## 2018-07-12 ENCOUNTER — Ambulatory Visit (INDEPENDENT_AMBULATORY_CARE_PROVIDER_SITE_OTHER): Payer: Medicare HMO | Admitting: Orthopedic Surgery

## 2018-07-12 ENCOUNTER — Other Ambulatory Visit: Payer: Self-pay

## 2018-07-12 VITALS — BP 147/103 | HR 83 | Temp 97.6°F | Ht 67.5 in | Wt 148.0 lb

## 2018-07-12 DIAGNOSIS — S92515A Nondisplaced fracture of proximal phalanx of left lesser toe(s), initial encounter for closed fracture: Secondary | ICD-10-CM

## 2018-07-12 NOTE — Patient Instructions (Signed)
Steps to Quit Smoking Smoking tobacco is the leading cause of preventable death. It can affect almost every organ in the body. Smoking puts you and people around you at risk for many serious, long-lasting (chronic) diseases. Quitting smoking can be hard, but it is one of the best things that you can do for your health. It is never too late to quit. How do I get ready to quit? When you decide to quit smoking, make a plan to help you succeed. Before you quit:  Pick a date to quit. Set a date within the next 2 weeks to give you time to prepare.  Write down the reasons why you are quitting. Keep this list in places where you will see it often.  Tell your family, friends, and co-workers that you are quitting. Their support is important.  Talk with your doctor about the choices that may help you quit.  Find out if your health insurance will pay for these treatments.  Know the people, places, things, and activities that make you want to smoke (triggers). Avoid them. What first steps can I take to quit smoking?  Throw away all cigarettes at home, at work, and in your car.  Throw away the things that you use when you smoke, such as ashtrays and lighters.  Clean your car. Make sure to empty the ashtray.  Clean your home, including curtains and carpets. What can I do to help me quit smoking? Talk with your doctor about taking medicines and seeing a counselor at the same time. You are more likely to succeed when you do both.  If you are pregnant or breastfeeding, talk with your doctor about counseling or other ways to quit smoking. Do not take medicine to help you quit smoking unless your doctor tells you to do so. To quit smoking: Quit right away  Quit smoking totally, instead of slowly cutting back on how much you smoke over a period of time.  Go to counseling. You are more likely to quit if you go to counseling sessions regularly. Take medicine You may take medicines to help you quit. Some  medicines need a prescription, and some you can buy over-the-counter. Some medicines may contain a drug called nicotine to replace the nicotine in cigarettes. Medicines may:  Help you to stop having the desire to smoke (cravings).  Help to stop the problems that come when you stop smoking (withdrawal symptoms). Your doctor may ask you to use:  Nicotine patches, gum, or lozenges.  Nicotine inhalers or sprays.  Non-nicotine medicine that is taken by mouth. Find resources Find resources and other ways to help you quit smoking and remain smoke-free after you quit. These resources are most helpful when you use them often. They include:  Online chats with a counselor.  Phone quitlines.  Printed self-help materials.  Support groups or group counseling.  Text messaging programs.  Mobile phone apps. Use apps on your mobile phone or tablet that can help you stick to your quit plan. There are many free apps for mobile phones and tablets as well as websites. Examples include Quit Guide from the CDC and smokefree.gov  What things can I do to make it easier to quit?   Talk to your family and friends. Ask them to support and encourage you.  Call a phone quitline (1-800-QUIT-NOW), reach out to support groups, or work with a counselor.  Ask people who smoke to not smoke around you.  Avoid places that make you want to smoke,   such as: ? Bars. ? Parties. ? Smoke-break areas at work.  Spend time with people who do not smoke.  Lower the stress in your life. Stress can make you want to smoke. Try these things to help your stress: ? Getting regular exercise. ? Doing deep-breathing exercises. ? Doing yoga. ? Meditating. ? Doing a body scan. To do this, close your eyes, focus on one area of your body at a time from head to toe. Notice which parts of your body are tense. Try to relax the muscles in those areas. How will I feel when I quit smoking? Day 1 to 3 weeks Within the first 24 hours,  you may start to have some problems that come from quitting tobacco. These problems are very bad 2-3 days after you quit, but they do not often last for more than 2-3 weeks. You may get these symptoms:  Mood swings.  Feeling restless, nervous, angry, or annoyed.  Trouble concentrating.  Dizziness.  Strong desire for high-sugar foods and nicotine.  Weight gain.  Trouble pooping (constipation).  Feeling like you may vomit (nausea).  Coughing or a sore throat.  Changes in how the medicines that you take for other issues work in your body.  Depression.  Trouble sleeping (insomnia). Week 3 and afterward After the first 2-3 weeks of quitting, you may start to notice more positive results, such as:  Better sense of smell and taste.  Less coughing and sore throat.  Slower heart rate.  Lower blood pressure.  Clearer skin.  Better breathing.  Fewer sick days. Quitting smoking can be hard. Do not give up if you fail the first time. Some people need to try a few times before they succeed. Do your best to stick to your quit plan, and talk with your doctor if you have any questions or concerns. Summary  Smoking tobacco is the leading cause of preventable death. Quitting smoking can be hard, but it is one of the best things that you can do for your health.  When you decide to quit smoking, make a plan to help you succeed.  Quit smoking right away, not slowly over a period of time.  When you start quitting, seek help from your doctor, family, or friends. This information is not intended to replace advice given to you by your health care provider. Make sure you discuss any questions you have with your health care provider. Document Released: 10/26/2008 Document Revised: 03/19/2018 Document Reviewed: 03/20/2018 Elsevier Patient Education  2020 [employer]  

## 2018-07-12 NOTE — Progress Notes (Signed)
Patricia Jones  07/12/2018  HISTORY SECTION :  Chief Complaint  Patient presents with  . Fracture    Lt foot small toe DOI 07/02/18   HPI The patient presents for evaluation of fracture left small toe Complains of pain Location left small toe Duration 10 days Quality dull Severity moderate Associated with swelling  Review of Systems  Constitutional: Positive for diaphoresis. Negative for fever.  Respiratory: Negative for shortness of breath.   Cardiovascular: Negative for chest pain.  Skin: Negative.   Neurological: Negative for tingling and sensory change.     Past Medical History:  Diagnosis Date  . Allergy   . Chronic migraine without aura, with intractable migraine, so stated, without mention of status migrainosus 12/08/2012  . Depression   . DJD (degenerative joint disease)   . Fibromyalgia   . GERD (gastroesophageal reflux disease)   . Hepatitis C 1980   symptomatic w/ jaundice, NEGATIVE HCV RNA  08/2005, positive hepatitis C antibody but negative HCVRNA in 2019  . Hypersomnia with sleep apnea, unspecified   . Insomnia   . Lumbago 12/08/2012  . Migraines   . Myalgia and myositis, unspecified 12/08/2012  . Overactive bladder   . S/P colonoscopy 10/29/1999   Dr Arita Miss polyps-hyperplastic  . S/P colonoscopy 09/12/2005   4 hyperplastic polyps  . Sleep apnea    CPAP  . Tobacco dependence   . Type II or unspecified type diabetes mellitus without mention of complication, not stated as uncontrolled   . Unspecified hereditary and idiopathic peripheral neuropathy     Past Surgical History:  Procedure Laterality Date  . ABDOMINAL HYSTERECTOMY  10/30/1999   TAH/BSO  . COLONOSCOPY     2007, 2001, hyperplastic polyps  . COLONOSCOPY WITH PROPOFOL N/A 10/20/2017   Procedure: COLONOSCOPY WITH PROPOFOL;  Surgeon: Danie Binder, MD;  Location: [facility];  Service: Endoscopy;  Laterality: N/A;  10:45am  . complete hyst  2001  . ESOPHAGOGASTRODUODENOSCOPY (EGD)  WITH PROPOFOL N/A 10/20/2017   Procedure: ESOPHAGOGASTRODUODENOSCOPY (EGD) WITH PROPOFOL;  Surgeon: Danie Binder, MD;  Location: [facility];  Service: Endoscopy;  Laterality: N/A;  . EXPLORATORY LAPAROTOMY     Ruptured ovarian cyst with hemorrhage in her 71s  . FOOT SURGERY Left   . HIP SURGERY     laproscopic left  . PELVIC LAPAROSCOPY     Multiple for ovarian cysts and endometriosis, infertility issues  . POLYPECTOMY  10/20/2017   Procedure: POLYPECTOMY;  Surgeon: Danie Binder, MD;  Location: [facility];  Service: Endoscopy;;  . SAVORY DILATION N/A 10/20/2017   Procedure: SAVORY DILATION;  Surgeon: Danie Binder, MD;  Location: [facility];  Service: Endoscopy;  Laterality: N/A;  . SINUS SURGERY WITH INSTATRAK       No Known Allergies   Current Outpatient Medications:  .  AIMOVIG 70 MG/ML SOAJ, Inject 70 mg into the skin every 30 (thirty) days. , Disp: , Rfl:  .  albuterol (VENTOLIN HFA) 108 (90 Base) MCG/ACT inhaler, INHALE 2 PUFFS INTO THE LUNGS EVERY 4 HOURS AS NEEDED FOR WHEEZING ORSHORTNESS OF BREATH., Disp: 18 g, Rfl: 0 .  amitriptyline (ELAVIL) 10 MG tablet, Take 20 mg by mouth at bedtime. , Disp: , Rfl:  .  baclofen (LIORESAL) 10 MG tablet, Take 10 mg by mouth 3 (three) times daily as needed for muscle spasms. , Disp: , Rfl:  .  BREO ELLIPTA 100-25 MCG/INH AEPB, INHALE 1 PUFF INTO LUNGS DAILY., Disp: 28 each, Rfl:  0 .  buprenorphine (SUBUTEX) 2 MG SUBL SL tablet, Place 2-4 mg under the tongue daily as needed (for pain). , Disp: , Rfl:  .  butalbital-acetaminophen-caffeine (FIORICET, ESGIC) 50-325-40 MG per tablet, Take 1-2 tablets by mouth every 4 (four) hours as needed for headache or migraine. , Disp: , Rfl:  .  Calcium Carbonate-Vitamin D (SM CALCIUM 500/VITAMIN D3 PO), Take 1 tablet by mouth daily., Disp: , Rfl:  .  Cholecalciferol (VITAMIN D) 2000 UNITS tablet, Take 2,000 Units by mouth daily., Disp: , Rfl:  .  diazepam (VALIUM) 10 MG tablet, Take 10 mg by  mouth at bedtime as needed for anxiety. , Disp: , Rfl:  .  FLUoxetine (PROZAC) 40 MG capsule, Take 40 mg by mouth at bedtime. , Disp: , Rfl:  .  gabapentin (NEURONTIN) 600 MG tablet, Take 600 mg by mouth 3 (three) times daily as needed (for pain). , Disp: , Rfl:  .  pantoprazole (PROTONIX) 40 MG tablet, 1 po 30 mins prior to first meal, Disp: 30 tablet, Rfl: 11 .  SUMAtriptan (IMITREX) 6 MG/0.5ML SOLN, Inject 6 mg into the skin every 2 (two) hours as needed for migraine or headache. , Disp: , Rfl:  .  traMADol (ULTRAM) 50 MG tablet, Take 50-100 mg by mouth every 6 (six) hours as needed for moderate pain. , Disp: , Rfl:  .  albuterol (PROVENTIL) (2.5 MG/3ML) 0.083% nebulizer solution, Take 3 mLs (2.5 mg total) by nebulization every 6 (six) hours as needed for wheezing or shortness of breath. (Patient not taking: Reported on 07/12/2018), Disp: 150 mL, Rfl: 1   PHYSICAL EXAM SECTION: 1) BP (!) 147/103   Pulse 83   Temp 97.6 F (36.4 C)   Ht 5' 7.5" (1.715 m)   Wt 148 lb (67.1 kg)   BMI 22.84 kg/m   Body mass index is 22.84 kg/m. General appearance: Well-developed well-nourished no gross deformities  2) Cardiovascular normal pulse and perfusion in all 4 extremities normal color without edema  3) Neurologically deep tendon reflexes are equal and normal, no sensation loss or deficits no pathologic reflexes  4) Psychological: Awake alert and oriented x3 mood and affect normal  5) Skin no lacerations or ulcerations no nodularity no palpable masses, no erythema or nodularity  6) Musculoskeletal:   Left foot she has a swollen small toe it has a little bit of widening of the interdigital space that she is concerned about I am not it is tender swollen it is decreased in terms of range of motion at the joint actively neurovascular exam is otherwise intact  Scars are noted from prior surgery on the fifth and first digit K wires are noted on x-ray   Right foot normal and comparison to the left  foot that widened interspace of the fourth and fifth digit is noted   MEDICAL DECISION SECTION:  Encounter Diagnosis  Name Primary?  . Closed nondisplaced fracture of proximal phalanx of lesser toe of left foot, initial encounter Yes    Imaging [facility] x-rays multiple views of the left foot there is a K wire in the fifth digit and first digit from prior surgery there is a fracture comminuted in a inverted Y-shaped of the proximal phalanx of the fifth digit of the foot  Plan:  (Rx., Inj., surg., Frx, MRI/CT, XR:2)  Taping should be sufficient  X-ray in 4 weeks 2:04 PM Arther Abbott, MD

## 2018-07-20 DIAGNOSIS — H31001 Unspecified chorioretinal scars, right eye: Secondary | ICD-10-CM | POA: Diagnosis not present

## 2018-07-20 DIAGNOSIS — H43813 Vitreous degeneration, bilateral: Secondary | ICD-10-CM | POA: Diagnosis not present

## 2018-08-03 ENCOUNTER — Ambulatory Visit: Payer: Medicare HMO | Admitting: Family Medicine

## 2018-08-09 ENCOUNTER — Ambulatory Visit: Payer: Medicare HMO | Admitting: Orthopedic Surgery

## 2018-08-09 ENCOUNTER — Ambulatory Visit: Payer: Medicare HMO | Admitting: Family Medicine

## 2018-08-09 ENCOUNTER — Encounter: Payer: Self-pay | Admitting: Orthopedic Surgery

## 2018-08-12 ENCOUNTER — Ambulatory Visit (INDEPENDENT_AMBULATORY_CARE_PROVIDER_SITE_OTHER): Payer: Medicare HMO | Admitting: Family Medicine

## 2018-08-12 ENCOUNTER — Encounter: Payer: Self-pay | Admitting: Family Medicine

## 2018-08-12 ENCOUNTER — Other Ambulatory Visit: Payer: Self-pay

## 2018-08-12 VITALS — BP 96/58 | HR 69 | Temp 98.1°F | Resp 18 | Ht 67.5 in | Wt 144.6 lb

## 2018-08-12 DIAGNOSIS — E559 Vitamin D deficiency, unspecified: Secondary | ICD-10-CM

## 2018-08-12 DIAGNOSIS — K029 Dental caries, unspecified: Secondary | ICD-10-CM | POA: Diagnosis not present

## 2018-08-12 DIAGNOSIS — K047 Periapical abscess without sinus: Secondary | ICD-10-CM

## 2018-08-12 DIAGNOSIS — N3281 Overactive bladder: Secondary | ICD-10-CM | POA: Diagnosis not present

## 2018-08-12 DIAGNOSIS — R399 Unspecified symptoms and signs involving the genitourinary system: Secondary | ICD-10-CM

## 2018-08-12 LAB — URINALYSIS, ROUTINE W REFLEX MICROSCOPIC
Bacteria, UA: NONE SEEN /HPF
Bilirubin Urine: NEGATIVE
Glucose, UA: NEGATIVE
Hyaline Cast: NONE SEEN /LPF
Ketones, ur: NEGATIVE
Leukocytes,Ua: NEGATIVE
Nitrite: NEGATIVE
Protein, ur: NEGATIVE
Specific Gravity, Urine: 1.019 (ref 1.001–1.03)
WBC, UA: NONE SEEN /HPF (ref 0–5)
pH: 5.5 (ref 5.0–8.0)

## 2018-08-12 LAB — MICROSCOPIC MESSAGE

## 2018-08-12 MED ORDER — MIRABEGRON ER 25 MG PO TB24: 25.0000 mg | ORAL_TABLET | Freq: Every day | ORAL | 2 refills | Status: DC

## 2018-08-12 NOTE — Patient Instructions (Signed)
Will treat for presumed UTI, if your symptoms do not improve please follow up We will follow the culture to make sure the antibiotics cover.  Augmentin is for dental infections.  I put in a referral to a specialist - you may need documentation from your dentist or may need to see a endodontist first.  We will have to wait and see what is required.  I would also follow up with urology if urinary symptoms are not better after treatment.  The medications for over active bladder cause dry mouth, and there is only one newer med that has less side effects.  You may be able to get samples from urology.  Or we may have to do prior authorization to see if insurance will cover.   Overactive Bladder, Adult  Overactive bladder refers to a condition in which a person has a sudden need to pass urine. The person may leak urine if he or she cannot get to the bathroom fast enough (urinary incontinence). A person with this condition may also wake up several times in the night to go to the bathroom. Overactive bladder is associated with poor nerve signals between your bladder and your brain. Your bladder may get the signal to empty before it is full. You may also have very sensitive muscles that make your bladder squeeze too soon. These symptoms might interfere with daily work or social activities. What are the causes? This condition may be associated with or caused by:  Urinary tract infection.  Infection of nearby tissues, such as the prostate.  Prostate enlargement.  Surgery on the uterus or urethra.  Bladder stones, inflammation, or tumors.  Drinking too much caffeine or alcohol.  Certain medicines, especially medicines that get rid of extra fluid in the body (diuretics).  Muscle or nerve weakness, especially from: ? A spinal cord injury. ? Stroke. ? Multiple sclerosis. ? Parkinson's disease.  Diabetes.  Constipation. What increases the risk? You may be at greater risk for overactive bladder  if you:  Are an older adult.  Smoke.  Are going through menopause.  Have prostate problems.  Have a neurological disease, such as stroke, dementia, Parkinson's disease, or multiple sclerosis (MS).  Eat or drink things that irritate the bladder. These include alcohol, spicy food, and caffeine.  Are overweight or obese. What are the signs or symptoms? Symptoms of this condition include:  Sudden, strong urge to urinate.  Leaking urine.  Urinating 8 or more times a day.  Waking up to urinate 2 or more times a night. How is this diagnosed? Your health care provider may suspect overactive bladder based on your symptoms. He or she will diagnose this condition by:  A physical exam and medical history.  Blood or urine tests. You might need bladder or urine tests to help determine what is causing your overactive bladder. You might also need to see a health care provider who specializes in urinary tract problems (urologist). How is this treated? Treatment for overactive bladder depends on the cause of your condition and whether it is mild or severe. You can also make lifestyle changes at home. Options include:  Bladder training. This may include: ? Learning to control the urge to urinate by following a schedule that directs you to urinate at regular intervals (timed voiding). ? Doing Kegel exercises to strengthen your pelvic floor muscles, which support your bladder. Toning these muscles can help you control urination, even if your bladder muscles are overactive.  Special devices. This may include: ?  Biofeedback, which uses sensors to help you become aware of your body's signals. ? Electrical stimulation, which uses electrodes placed inside the body (implanted) or outside the body. These electrodes send gentle pulses of electricity to strengthen the nerves or muscles that control the bladder. ? Women may use a plastic device that fits into the vagina and supports the bladder (pessary).   Medicines. ? Antibiotics to treat bladder infection. ? Antispasmodics to stop the bladder from releasing urine at the wrong time. ? Tricyclic antidepressants to relax bladder muscles. ? Injections of botulinum toxin type A directly into the bladder tissue to relax bladder muscles.  Lifestyle changes. This may include: ? Weight loss. Talk to your health care provider about weight loss methods that would work best for you. ? Diet changes. This may include reducing how much alcohol and caffeine you consume, or drinking fluids at different times of the day. ? Not smoking. Do not use any products that contain nicotine or tobacco, such as cigarettes and e-cigarettes. If you need help quitting, ask your health care provider.  Surgery. ? A device may be implanted to help manage the nerve signals that control urination. ? An electrode may be implanted to stimulate electrical signals in the bladder. ? A procedure may be done to change the shape of the bladder. This is done only in very severe cases. Follow these instructions at home: Lifestyle  Make any diet or lifestyle changes that are recommended by your health care provider. These may include: ? Drinking less fluid or drinking fluids at different times of the day. ? Cutting down on caffeine or alcohol. ? Doing Kegel exercises. ? Losing weight if needed. ? Eating a healthy and balanced diet to prevent constipation. This may include:  Eating foods that are high in fiber, such as fresh fruits and vegetables, whole grains, and beans.  Limiting foods that are high in fat and processed sugars, such as fried and sweet foods. General instructions  Take over-the-counter and prescription medicines only as told by your health care provider.  If you were prescribed an antibiotic medicine, take it as told by your health care provider. Do not stop taking the antibiotic even if you start to feel better.  Use any implants or pessary as told by your  health care provider.  If needed, wear pads to absorb urine leakage.  Keep a journal or log to track how much and when you drink and when you feel the need to urinate. This will help your health care provider monitor your condition.  Keep all follow-up visits as told by your health care provider. This is important. Contact a health care provider if:  You have a fever.  Your symptoms do not get better with treatment.  Your pain and discomfort get worse.  You have more frequent urges to urinate. Get help right away if:  You are not able to control your bladder. Summary  Overactive bladder refers to a condition in which a person has a sudden need to pass urine.  Several conditions may lead to an overactive bladder.  Treatment for overactive bladder depends on the cause and severity of your condition.  Follow your health care provider's instructions about lifestyle changes, doing Kegel exercises, keeping a journal, and taking medicines. This information is not intended to replace advice given to you by your health care provider. Make sure you discuss any questions you have with your health care provider. Document Released: 10/26/2008 Document Revised: 04/22/2018 Document Reviewed: 01/15/2017 Elsevier  Patient Education  2020 [employer]  Dental caries are spots of decay (cavities) in teeth. They are in the outer layer of your tooth (enamel). Treat them as soon as you can. If they are not treated, they can spread decay and lead to painful infection. Follow these instructions at home: General instructions  Take good care of your mouth and teeth. This keeps them healthy. ? Brush your teeth 2 times a day. Use toothpaste with fluoride in it. ? Floss your teeth once a day.  If your dentist prescribed an antibiotic medicine to treat an infection, take it as told. Do not stop taking the antibiotic even if your condition gets better.  Keep all follow-up visits as  told by your dentist. This is important. This includes all cleanings. Preventing dental caries   Brush your teeth every morning and night. Use fluoride toothpaste.  Get regular dental cleanings.  If you are at risk of dental caries. ? Wash your mouth with prescription mouthwash (chlorhexidine). ? Put topical fluoride on your teeth.  Drink water with fluoride in it.  Drink water instead of sugary drinks.  Eat healthy meals and snacks. Contact a doctor if:  You have symptoms of tooth decay. Summary  Dental caries are spots of decay (cavities) in teeth. They are in the outer layer of your tooth.  Take an antibiotic to treat an infection, if told by your dentist. Do not stop taking the antibiotic even if your condition gets better.  Regular dental cleanings and brushing can help prevent dental caries. This information is not intended to replace advice given to you by your health care provider. Make sure you discuss any questions you have with your health care provider. Document Released: 10/09/2007 Document Revised: 12/12/2016 Document Reviewed: 09/16/2015 Elsevier Patient Education  2020 [employer].

## 2018-08-12 NOTE — Progress Notes (Signed)
Patient ID: Patricia Jones, female    DOB: [birth year], 63 y.o.   MRN: [id number]  PCP: Delsa Grana, PA-C  Chief Complaint  Patient presents with  . Dental Pain    abscess, wants consult for oral surgeon  . Urinary Tract Infection    difficult voiding, abd pain, strong smell to urine, drinking lots of water    Subjective:   Patricia Jones is a 63 y.o. female, presents to clinic with CC of dental pain and dysuria Dental decay - states a few years ago teeth started to rot, break and fall out.  She went to dentist and they told her she needed surgery.  She says one day her teeth were fine and then they weren't any more.  She said same with her bones.  She is supposed to supplement, but doesn't very regularly.    Dental Pain  This is a chronic problem. The current episode started in the past 7 days. The problem occurs constantly. The problem has been gradually improving. The pain is moderate. Pertinent negatives include no difficulty swallowing, facial pain, fever, oral bleeding, sinus pressure or thermal sensitivity.  Urinary Tract Infection  This is a recurrent ( new worsening of frequency and dysuria, chronic OAB and hx of hematuria) problem. The current episode started in the past 7 days. The problem occurs every urination. The problem has been unchanged. The quality of the pain is described as burning. The pain is mild. There has been no fever. She is not sexually active. There is no history of pyelonephritis. Associated symptoms include frequency, hesitancy and urgency. Pertinent negatives include no chills, discharge, flank pain, hematuria, nausea, possible pregnancy, sweats or vomiting. She has tried nothing for the symptoms.      Patient Active Problem List   Diagnosis Date Noted  . Cecal polyp   . Hyperplastic polyp of ascending colon   . Erosive gastritis   . Esophageal dysphagia 08/11/2017  . Change in bowel function 08/11/2017  . RUQ pain 08/11/2017  . Thyroid goiter  05/18/2017  . Smoker 03/08/2014  . Sleep disorder due to a general medical condition, insomnia type 11/03/2013  . Migraine without status migrainosus 11/03/2013  . Hypersomnia with sleep apnea 11/03/2013  . Fibromyalgia 11/03/2013  . Idiopathic peripheral neuropathy 11/03/2013  . Diabetes mellitus, type 2 ([place]) 11/03/2013  . Overactive bladder 11/03/2013  . Lumbar radiculopathy 11/03/2013  . Hyperglycemia 02/14/2013  . Chronic migraine without aura, with intractable migraine, so stated, without mention of status migrainosus 12/08/2012  . Lumbago 12/08/2012  . Myalgia and myositis, unspecified 12/08/2012  . Osteoporosis 12/03/2010  . Tobacco dependence   . Insomnia   . GERD (gastroesophageal reflux disease) 07/11/2010  . Hx of colonic polyps 07/11/2010  . Hepatitis C 07/11/2010  . HEPATITIS C 10/24/2009  . ACHILLES TENDINITIS 10/24/2009  . COLLES' FRACTURE, LEFT 05/10/2008     Prior to Admission medications   Medication Sig Start Date End Date Taking? Authorizing Provider  AIMOVIG 70 MG/ML SOAJ Inject 70 mg into the skin every 30 (thirty) days.  04/22/17  Yes [provider]  albuterol (PROVENTIL) (2.5 MG/3ML) 0.083% nebulizer solution Take 3 mLs (2.5 mg total) by nebulization every 6 (six) hours as needed for wheezing or shortness of breath. 11/04/17  Yes Delsa Grana, PA-C  albuterol (VENTOLIN HFA) 108 (90 Base) MCG/ACT inhaler INHALE 2 PUFFS INTO THE LUNGS EVERY 4 HOURS AS NEEDED FOR WHEEZING ORSHORTNESS OF BREATH. 05/28/18  Yes Delsa Grana, PA-C  amitriptyline (  ELAVIL) 10 MG tablet Take 20 mg by mouth at bedtime.  05/12/17  Yes [provider]  baclofen (LIORESAL) 10 MG tablet Take 10 mg by mouth 3 (three) times daily as needed for muscle spasms.    Yes [provider]  BREO ELLIPTA 100-25 MCG/INH AEPB INHALE 1 PUFF INTO LUNGS DAILY. 05/28/18  Yes Delsa Grana, PA-C  buprenorphine (SUBUTEX) 2 MG SUBL SL tablet Place 2-4 mg under the tongue daily as needed  (for pain).  09/16/17  Yes [provider]  butalbital-acetaminophen-caffeine (FIORICET, ESGIC) 50-325-40 MG per tablet Take 1-2 tablets by mouth every 4 (four) hours as needed for headache or migraine.  07/09/10  Yes [provider]  Calcium Carbonate-Vitamin D (SM CALCIUM 500/VITAMIN D3 PO) Take 1 tablet by mouth daily.   Yes [provider]  Cholecalciferol (VITAMIN D) 2000 UNITS tablet Take 2,000 Units by mouth daily.   Yes [provider]  diazepam (VALIUM) 10 MG tablet Take 10 mg by mouth at bedtime as needed for anxiety.  06/15/15  Yes [provider]  FLUoxetine (PROZAC) 40 MG capsule Take 40 mg by mouth at bedtime.  06/17/10  Yes [provider]  gabapentin (NEURONTIN) 600 MG tablet Take 600 mg by mouth 3 (three) times daily as needed (for pain).    Yes [provider]  pantoprazole (PROTONIX) 40 MG tablet 1 po 30 mins prior to first meal 10/20/17  Yes Fields, Marga Melnick, MD  SUMAtriptan (IMITREX) 6 MG/0.5ML SOLN Inject 6 mg into the skin every 2 (two) hours as needed for migraine or headache.  07/09/10  Yes [provider]  traMADol (ULTRAM) 50 MG tablet Take 50-100 mg by mouth every 6 (six) hours as needed for moderate pain.  06/15/15  Yes [provider]     No Known Allergies   Family History  Problem Relation Age of Onset  . Aneurysm Mother   . Heart disease Mother   . Coronary artery disease Father   . Stroke Father   . Heart disease Father   . Cervical cancer Sister   . Ovarian cancer Sister   . Colon cancer Maternal Grandmother        greater than age 57. was her great grandmother  . Diabetes Maternal Aunt      Social History   Socioeconomic History  . Marital status: Divorced    Spouse name: Not on file  . Number of children: 2  . Years of education: Not on file  . Highest education level: Not on file  Occupational History  . Occupation: disabled     Comment: previously Therapist, sports @ [employer]  . Financial resource strain: Not on file  . Food insecurity    Worry: Not on file    Inability: Not on file  . Transportation needs    Medical: Not on file    Non-medical: Not on file  Tobacco Use  . Smoking status: Current Every Day Smoker    Packs/day: 2.00    Years: 40.00    Pack years: 80.00    Types: Cigarettes    Start date: 12/25/1973  . Smokeless tobacco: Never Used  Substance and Sexual Activity  . Alcohol use: No    Comment: heavy etoh x 30 yrs, quit 14 yrs ago  . Drug use: Yes    Types: Marijuana    Comment: maijuana use-occ  . Sexual activity: Yes    Partners: Male    Comment: [facility]  Lifestyle  . Physical activity    Days per week: Not on file    Minutes per session: Not on file  . Stress: Not on file  Relationships  . Social Herbalist on phone: Not on file    Gets together: Not on file    Attends religious service: Not on file    Active member of club or organization: Not on file    Attends meetings of clubs or organizations: Not on file    Relationship status: Not on file  . Intimate partner violence    Fear of current or ex partner: Not on file    Emotionally abused: Not on file    Physically abused: Not on file    Forced sexual activity: Not on file  Other Topics Concern  . Not on file  Social History Narrative  . Not on file     Review of Systems  Constitutional: Negative.  Negative for chills and fever.  HENT: Negative.  Negative for sinus pressure.   Eyes: Negative.   Respiratory: Negative.   Cardiovascular: Negative.   Gastrointestinal: Negative.  Negative for nausea and vomiting.  Endocrine: Negative.   Genitourinary: Positive for frequency, hesitancy and urgency. Negative for flank pain and hematuria.  Musculoskeletal: Negative.   Skin: Negative.   Allergic/Immunologic: Negative.   Neurological: Negative.   Hematological: Negative.   Psychiatric/Behavioral: Negative.   All other systems reviewed and are negative.       Objective:    Vitals:   08/12/18 1143  BP: (!) 96/58  Pulse: 69  Resp: 18  Temp: 98.1 F (36.7 C)  TempSrc: Oral  SpO2: 93%  Weight: 144 lb 9.6 oz (65.6 kg)  Height: 5' 7.5" (1.715 m)      Physical Exam Vitals signs and nursing note reviewed.  Constitutional:      General: She is not in acute distress.    Appearance: She is well-developed. She is ill-appearing (chronically ill apearing - her baseline today). She is not toxic-appearing or diaphoretic.  HENT:     Head: Normocephalic and atraumatic.     Nose: Nose normal.     Mouth/Throat:     Lips: No lesions.     Mouth: Mucous membranes are dry.     Dentition: Abnormal dentition. Dental tenderness, gingival swelling and dental caries present. No dental abscesses.     Pharynx: Oropharynx is clear.     Comments: No facial or sublingual tenderness to palpation, no facial and submandibular edema, swelling, induration, erythema or fluctuance  Diffuse/vast dental decay to every tooth, many decayed down past gumline Eyes:     General:        Right eye: No discharge.        Left eye: No discharge.     Conjunctiva/sclera: Conjunctivae normal.  Neck:     Trachea: No tracheal deviation.  Cardiovascular:     Rate and Rhythm: Normal rate and regular rhythm.  Pulmonary:     Effort: Pulmonary effort is normal.     Breath sounds: Normal breath sounds. No wheezing, rhonchi or rales.  Abdominal:     General: Bowel sounds are normal. There is no distension.     Palpations: Abdomen is soft.     Tenderness: There is no abdominal tenderness. There is no right CVA tenderness, left CVA tenderness, guarding or rebound.     Hernia: No hernia is present.  Musculoskeletal: Normal range of motion.  Skin:    General: Skin is  warm and dry.     Findings: No rash.  Neurological:     Mental Status: She is alert.     Motor: No abnormal muscle tone.     Coordination: Coordination normal.  Psychiatric:        Attention and Perception:  Attention normal.        Speech: Speech normal.        Behavior: Behavior is cooperative.     Comments: Her baseline, appears intoxicated, slurred, slowly speaks and open and closes her eyes           Assessment & Plan:      ICD-10-CM   1. UTI symptoms  R39.9 Urinalysis, Routine w reflex microscopic    Urine Culture    CBC with Differential/Platelet    COMPLETE METABOLIC PANEL WITH GFR    VITAMIN D 25 Hydroxy (Vit-D Deficiency, Fractures)   will try only Augmentin for teeth and see if culture is positive and if it covers, only has 2+ blood and sx mild, culture pending  2. Dental decay  K02.9 Ambulatory referral to Oral Maxillofacial Surgery    CBC with Differential/Platelet    COMPLETE METABOLIC PANEL WITH GFR    VITAMIN D 25 Hydroxy (Vit-D Deficiency, Fractures)   chronic, vast decay - likely needs endodontics or oral surgery, see no abscess today, will cover her worsening pain to left posterior molar with augmentin  3. Hypocalcemia  G89.16 COMPLETE METABOLIC PANEL WITH GFR   recheck calcium, not supplementing and trouble with teeth and bones per pt  4. Vitamin D deficiency  E55.9 VITAMIN D 25 Hydroxy (Vit-D Deficiency, Fractures)  5. Dental abscess  K04.7 CBC with Differential/Platelet   was swollen, pt believes it started draining and resolved on its own, no fluctuance or abscess identified today, no concern for spreading infection or ludwigs     Referrals placed, unsure if medicare will cover, may need to obtain dentist records.  Trial of myrbetriq if she can get - to treat her chronic OAB.  She already has severe dry mouth, do not thing she will tolerate other OAB meds/anticholinergics  Delsa Grana, PA-C 08/12/18 12:05 PM

## 2018-08-13 LAB — URINE CULTURE
MICRO NUMBER:: 720513
Result:: NO GROWTH
SPECIMEN QUALITY:: ADEQUATE

## 2018-08-13 LAB — COMPLETE METABOLIC PANEL WITH GFR
AG Ratio: 1.2 (calc) (ref 1.0–2.5)
ALT: 9 U/L (ref 6–29)
AST: 15 U/L (ref 10–35)
Albumin: 3.8 g/dL (ref 3.6–5.1)
Alkaline phosphatase (APISO): 77 U/L (ref 37–153)
BUN: 9 mg/dL (ref 7–25)
CO2: 29 mmol/L (ref 20–32)
Calcium: 9.2 mg/dL (ref 8.6–10.4)
Chloride: 102 mmol/L (ref 98–110)
Creat: 0.66 mg/dL (ref 0.50–0.99)
GFR, Est African American: 110 mL/min/{1.73_m2} (ref >=60)
GFR, Est Non African American: 95 mL/min/{1.73_m2} (ref >=60)
Globulin: 3.1 g/dL (calc) (ref 1.9–3.7)
Glucose, Bld: 82 mg/dL (ref 65–99)
Potassium: 4.6 mmol/L (ref 3.5–5.3)
Sodium: 137 mmol/L (ref 135–146)
Total Bilirubin: 0.3 mg/dL (ref 0.2–1.2)
Total Protein: 6.9 g/dL (ref 6.1–8.1)

## 2018-08-13 LAB — VITAMIN D 25 HYDROXY (VIT D DEFICIENCY, FRACTURES): Vit D, 25-Hydroxy: 25 ng/mL — ABNORMAL LOW (ref 30–100)

## 2018-08-13 LAB — CBC WITH DIFFERENTIAL/PLATELET
Absolute Monocytes: 781 cells/uL (ref 200–950)
Basophils Absolute: 44 cells/uL (ref 0–200)
Basophils Relative: 0.4 %
Eosinophils Absolute: 495 cells/uL (ref 15–500)
Eosinophils Relative: 4.5 %
HCT: 44.2 % (ref 35.0–45.0)
Hemoglobin: 14.3 g/dL (ref 11.7–15.5)
Lymphs Abs: 2860 cells/uL (ref 850–3900)
MCH: 28.7 pg (ref 27.0–33.0)
MCHC: 32.4 g/dL (ref 32.0–36.0)
MCV: 88.6 fL (ref 80.0–100.0)
MPV: 10.5 fL (ref 7.5–12.5)
Monocytes Relative: 7.1 %
Neutro Abs: 6820 cells/uL (ref 1500–7800)
Neutrophils Relative %: 62 %
Platelets: 287 10*3/uL (ref 140–400)
RBC: 4.99 10*6/uL (ref 3.80–5.10)
RDW: 13 % (ref 11.0–15.0)
Total Lymphocyte: 26 %
WBC: 11 10*3/uL — ABNORMAL HIGH (ref 3.8–10.8)

## 2018-09-22 DIAGNOSIS — M545 Low back pain: Secondary | ICD-10-CM | POA: Diagnosis not present

## 2018-09-22 DIAGNOSIS — G4733 Obstructive sleep apnea (adult) (pediatric): Secondary | ICD-10-CM | POA: Diagnosis not present

## 2018-09-22 DIAGNOSIS — Z79891 Long term (current) use of opiate analgesic: Secondary | ICD-10-CM | POA: Diagnosis not present

## 2018-09-22 DIAGNOSIS — M542 Cervicalgia: Secondary | ICD-10-CM | POA: Diagnosis not present

## 2018-09-22 DIAGNOSIS — M797 Fibromyalgia: Secondary | ICD-10-CM | POA: Diagnosis not present

## 2018-09-29 ENCOUNTER — Other Ambulatory Visit: Payer: Self-pay | Admitting: Family Medicine

## 2018-09-29 DIAGNOSIS — R0609 Other forms of dyspnea: Secondary | ICD-10-CM

## 2018-10-13 ENCOUNTER — Telehealth: Payer: Self-pay | Admitting: Family Medicine

## 2018-10-13 MED ORDER — AMOXICILLIN 500 MG PO CAPS: 500.0000 mg | ORAL_CAPSULE | Freq: Two times a day (BID) | ORAL | 0 refills | Status: AC

## 2018-10-13 NOTE — Telephone Encounter (Signed)
Patient called in stating that she has an abscess tooth again. I advised her that we had referred her to an oral surgeon that had been trying to get her scheduled and we as well as the oral surgery office has been trying to contact her. Patient was given the number to contact them and is now requesting that we send in an antibiotic for her for her abscess teeth. Advised she may need an office visit for this. Patient stated she did not want to come in for an office visit. Please advise?

## 2018-10-13 NOTE — Telephone Encounter (Signed)
I will give her another round of antibiotics but she needs to see either a dentist or surgeon,  Also salt water gargle  Amoxicillin 500mg  TID x 7 days - send to pharmacy

## 2018-10-13 NOTE — Telephone Encounter (Signed)
Spoke with patient and informed her of medication will be sent to pharmacy. Patient verbalized understanding. Has an appointment scheduled on 10/25/2018 with Oral surgeon

## 2018-10-29 ENCOUNTER — Other Ambulatory Visit: Payer: Self-pay | Admitting: Family Medicine

## 2018-10-29 DIAGNOSIS — R0609 Other forms of dyspnea: Secondary | ICD-10-CM

## 2018-11-01 DIAGNOSIS — K029 Dental caries, unspecified: Secondary | ICD-10-CM | POA: Diagnosis not present

## 2018-11-25 ENCOUNTER — Telehealth: Payer: Self-pay | Admitting: *Deleted

## 2018-11-25 ENCOUNTER — Other Ambulatory Visit: Payer: Self-pay

## 2018-11-25 NOTE — Telephone Encounter (Signed)
-----   Message from [place], LPN sent at [phone]  8:49 AM EST ----- Regarding: FW: Needs OV  ----- Message ----- From: Alycia Rossetti, MD Sent: 11/23/2018   2:12 PM EST To: Alyson Locket, RMA Subject: Needs OV                                        I received a request for medical clearance for oral surgery from Dr. Hoyt Koch.  Patient needs office visit for medical clearance and labs before I will sign this off.

## 2018-11-25 NOTE — Telephone Encounter (Signed)
Patient has appointment scheduled for 11/26/2018.

## 2018-11-26 ENCOUNTER — Encounter: Payer: Self-pay | Admitting: Family Medicine

## 2018-11-26 ENCOUNTER — Other Ambulatory Visit: Payer: Self-pay

## 2018-11-26 ENCOUNTER — Ambulatory Visit (INDEPENDENT_AMBULATORY_CARE_PROVIDER_SITE_OTHER): Payer: Medicare HMO | Admitting: Family Medicine

## 2018-11-26 VITALS — BP 130/68 | HR 96 | Temp 98.6°F | Resp 16 | Ht 68.0 in | Wt 138.0 lb

## 2018-11-26 DIAGNOSIS — F17200 Nicotine dependence, unspecified, uncomplicated: Secondary | ICD-10-CM

## 2018-11-26 DIAGNOSIS — M810 Age-related osteoporosis without current pathological fracture: Secondary | ICD-10-CM

## 2018-11-26 DIAGNOSIS — E119 Type 2 diabetes mellitus without complications: Secondary | ICD-10-CM | POA: Diagnosis not present

## 2018-11-26 DIAGNOSIS — Z8619 Personal history of other infectious and parasitic diseases: Secondary | ICD-10-CM | POA: Diagnosis not present

## 2018-11-26 DIAGNOSIS — J449 Chronic obstructive pulmonary disease, unspecified: Secondary | ICD-10-CM | POA: Insufficient documentation

## 2018-11-26 DIAGNOSIS — E079 Disorder of thyroid, unspecified: Secondary | ICD-10-CM

## 2018-11-26 DIAGNOSIS — E049 Nontoxic goiter, unspecified: Secondary | ICD-10-CM

## 2018-11-26 DIAGNOSIS — K2960 Other gastritis without bleeding: Secondary | ICD-10-CM

## 2018-11-26 DIAGNOSIS — Z01818 Encounter for other preprocedural examination: Secondary | ICD-10-CM | POA: Diagnosis not present

## 2018-11-26 DIAGNOSIS — Z23 Encounter for immunization: Secondary | ICD-10-CM

## 2018-11-26 DIAGNOSIS — J42 Unspecified chronic bronchitis: Secondary | ICD-10-CM

## 2018-11-26 DIAGNOSIS — N3281 Overactive bladder: Secondary | ICD-10-CM

## 2018-11-26 MED ORDER — FLUTICASONE PROPIONATE 50 MCG/ACT NA SUSP: 2.0000 | Freq: Every day | NASAL | 6 refills | Status: DC

## 2018-11-26 MED ORDER — MIRABEGRON ER 25 MG PO TB24: 25.0000 mg | ORAL_TABLET | Freq: Every day | ORAL | 2 refills | Status: DC

## 2018-11-26 MED ORDER — SHINGRIX 50 MCG/0.5ML IM SUSR: 0.5000 mL | Freq: Once | INTRAMUSCULAR | 1 refills | Status: AC

## 2018-11-26 NOTE — Assessment & Plan Note (Signed)
Obtain hepatitis C RNA level as well as metabolic panel liver function test kidney function.

## 2018-11-26 NOTE — Assessment & Plan Note (Signed)
Plan for bone density once she recovers from her surgery for her teeth.  Did advise her that she should take calcium 1200 mg vitamin D at 1000 international units

## 2018-11-26 NOTE — Assessment & Plan Note (Signed)
There were disorder with history of goiter but she has not followed up with endocrinology in the past 2 years. I will check TSH.  Did not feel any significant thyromegaly on exam today.  She also does not have any symptoms.

## 2018-11-26 NOTE — Progress Notes (Signed)
Subjective:    Patient ID: Patricia Jones, female    DOB: 1955-06-05, 63 y.o.   MRN: [id number]  Patient presents for Surgical Clearance (dental extraction)   Pt here for surgical clearance for teeth abstraction under general anesthesia.  She is to have 22 teeth removed  Meds reviewed  She has history of  DM- diet controlled, last A1C 6% noted in 2015 she states that she was following with endocrinology she has not seen Dr. Michiel Sites in 2 years.  She also has history of hyperthyroidism and this is not a follow-up in 2 years what also sounds like a goiter.    COPD- uses Breo and albuterol inhaler, smokes  Up to 1ppd  , denies any difficulty breathing recently has not had any exacerbations.     -Due for flu shot today    History viral hepatitis in the [employer], she did have treatment years ago in [employer] that she was hospitalized for quite some time secondary to jaundice.  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  She did have a negative viral load done in May 2019 for hepatitis C,    Osteoporosis gynecology- Dr Toney Rakes , diagnosed a few years ago,  She was on Forteo in the past took for a year or so but then she never followed back up.  She is also not taking her calcium or vitamin D.  Overdue for bone density    Has erosive gastiris he is on PPI as prescribed by gastroenterology.  He has chronic pain along with migraine headaches.  She takes significant amount of medication by neurology.  She has Valium as needed.  She does take Elavil every day.  She also takes gabapentin 3 times a day, baclofen Subutex tramadol.  Amiovig for migraines/ for breakthrough pain has fioricet and imitrex injectable          She has over active bladder, feels she has to urinate a lot, has difficulty getting out the urine at times , think she is prescribed Myrbetriq but states that it needed some type of authorization she  never heard anything since then.  She is willing to try something for her bladder.   GAD- on prozac     Review Of Systems:  GEN- denies fatigue, fever, weight loss,weakness, recent illness HEENT- denies eye drainage, change in vision, nasal discharge, CVS- denies chest pain, palpitations RESP- denies SOB, cough, wheeze ABD- denies N/V, change in stools, abd pain GU- denies dysuria, hematuria, dribbling, incontinence MSK- denies joint pain, muscle aches, injury Neuro- denies headache, dizziness, syncope, seizure activity       Objective:    BP 130/68   Pulse 96   Temp 98.6 F (37 C) (Temporal)   Resp 16   Ht 5\' 8"  (1.727 m)   Wt 138 lb (62.6 kg)   SpO2 94%   BMI 20.98 kg/m  GEN- NAD, alert and oriented x3 HEENT- PERRL, EOMI, non injected sclera, pink conjunctiva, MMM, oropharynx clear Neck- Supple, no thyromegaly CVS- RRR, no murmur RESP-CTAB ABD-NABS,soft,NT,ND Psych- flat affec, not anxious appearing, answers questions appropriately. EXT- No edema Pulses- Radial, DP- 2+  EKG- NSR, no ST changes       Assessment & Plan:      Problem List Items Addressed This Visit      Unprioritized   COPD (chronic obstructive pulmonary disease) (HCC)    Continue Breo , prn  albuterol, needs tobacco cessation      Relevant Medications   fluticasone (FLONASE) 50 MCG/ACT nasal spray   Diabetes mellitus, type 2 ([place])    Things this has been diet controlled.  We will check an A1c no current medications      Relevant Orders   CBC with Differential   Comprehensive metabolic panel   Hemoglobin A1c   Lipid Panel   Erosive gastritis    Continue with PPI prescribed by gastroenterology.  Need to avoid NSAIDs.      History of hepatitis C    Obtain hepatitis C RNA level as well as metabolic panel liver function test kidney function.      Relevant Medications   Zoster Vaccine Adjuvanted [facility]) injection   Other Relevant Orders   Hepatitis C RNA quantitative    Osteoporosis    Plan for bone density once she recovers from her surgery for her teeth.  Did advise her that she should take calcium 1200 mg vitamin D at 1000 international units      Overactive bladder    We will see if Myrbetriq is covered if not we may need to use something such as Vesicare or Ditropan      Relevant Medications   mirabegron ER (MYRBETRIQ) 25 MG TB24 tablet   RESOLVED: Smoker   Thyroid disorder    There were disorder with history of goiter but she has not followed up with endocrinology in the past 2 years. I will check TSH.  Did not feel any significant thyromegaly on exam today.  She also does not have any symptoms.      Relevant Orders   TSH   Thyroid goiter   Relevant Orders   TSH   Tobacco dependence    She is a smoker with COPD high risk for pneumonia.  She was given flu shot today.  Discussed cutting back on cigarettes as much as possible before her surgery.  This will help with healing.       Other Visit Diagnoses    Preoperative clearance    -  Primary   EKG is unremarkable blood pressure controlled if labs are stable she is clear for surgical intervention   Relevant Orders   EKG 12-Lead (Completed)   Need for immunization against influenza       Relevant Orders   Flu Vaccine QUAD 36+ mos IM (Completed)      Note: This dictation was prepared with Dragon dictation along with smaller phrase technology. Any transcriptional errors that result from this process are unintentional.

## 2018-11-26 NOTE — Assessment & Plan Note (Signed)
Continue Breo , prn albuterol, needs tobacco cessation

## 2018-11-26 NOTE — Assessment & Plan Note (Signed)
She is a smoker with COPD high risk for pneumonia.  She was given flu shot today.  Discussed cutting back on cigarettes as much as possible before her surgery.  This will help with healing.

## 2018-11-26 NOTE — Assessment & Plan Note (Signed)
Continue with PPI prescribed by gastroenterology.  Need to avoid NSAIDs.

## 2018-11-26 NOTE — Assessment & Plan Note (Signed)
Things this has been diet controlled.  We will check an A1c no current medications

## 2018-11-26 NOTE — Patient Instructions (Addendum)
Shingrix to pharmacy  FLu shot given We will call about bladder medication and lab results  F/U 4 months

## 2018-11-26 NOTE — Assessment & Plan Note (Signed)
We will see if Myrbetriq is covered if not we may need to use something such as Vesicare or Ditropan

## 2018-12-02 LAB — CBC WITH DIFFERENTIAL/PLATELET
Absolute Monocytes: 826 cells/uL (ref 200–950)
Basophils Absolute: 47 cells/uL (ref 0–200)
Basophils Relative: 0.4 %
Eosinophils Absolute: 366 cells/uL (ref 15–500)
Eosinophils Relative: 3.1 %
HCT: 44.9 % (ref 35.0–45.0)
Hemoglobin: 15 g/dL (ref 11.7–15.5)
Lymphs Abs: 2443 cells/uL (ref 850–3900)
MCH: 28.8 pg (ref 27.0–33.0)
MCHC: 33.4 g/dL (ref 32.0–36.0)
MCV: 86.3 fL (ref 80.0–100.0)
MPV: 10.6 fL (ref 7.5–12.5)
Monocytes Relative: 7 %
Neutro Abs: 8118 cells/uL — ABNORMAL HIGH (ref 1500–7800)
Neutrophils Relative %: 68.8 %
Platelets: 314 10*3/uL (ref 140–400)
RBC: 5.2 10*6/uL — ABNORMAL HIGH (ref 3.80–5.10)
RDW: 12.3 % (ref 11.0–15.0)
Total Lymphocyte: 20.7 %
WBC: 11.8 10*3/uL — ABNORMAL HIGH (ref 3.8–10.8)

## 2018-12-02 LAB — COMPREHENSIVE METABOLIC PANEL
AG Ratio: 1.1 (calc) (ref 1.0–2.5)
ALT: 6 U/L (ref 6–29)
AST: 14 U/L (ref 10–35)
Albumin: 3.8 g/dL (ref 3.6–5.1)
Alkaline phosphatase (APISO): 76 U/L (ref 37–153)
BUN: 12 mg/dL (ref 7–25)
CO2: 28 mmol/L (ref 20–32)
Calcium: 9.3 mg/dL (ref 8.6–10.4)
Chloride: 99 mmol/L (ref 98–110)
Creat: 0.63 mg/dL (ref 0.50–0.99)
Globulin: 3.4 g/dL (calc) (ref 1.9–3.7)
Glucose, Bld: 102 mg/dL — ABNORMAL HIGH (ref 65–99)
Potassium: 4 mmol/L (ref 3.5–5.3)
Sodium: 139 mmol/L (ref 135–146)
Total Bilirubin: 0.5 mg/dL (ref 0.2–1.2)
Total Protein: 7.2 g/dL (ref 6.1–8.1)

## 2018-12-02 LAB — HEMOGLOBIN A1C
Hgb A1c MFr Bld: 5.7 % of total Hgb — ABNORMAL HIGH (ref <5.7)
Mean Plasma Glucose: 117 (calc)
eAG (mmol/L): 6.5 (calc)

## 2018-12-02 LAB — LIPID PANEL
Cholesterol: 163 mg/dL (ref <200)
HDL: 41 mg/dL — ABNORMAL LOW (ref >=50)
LDL Cholesterol (Calc): 106 mg/dL (calc) — ABNORMAL HIGH
Non-HDL Cholesterol (Calc): 122 mg/dL (calc) (ref <130)
Total CHOL/HDL Ratio: 4 (calc) (ref <5.0)
Triglycerides: 73 mg/dL (ref <150)

## 2018-12-02 LAB — HEPATITIS C RNA QUANTITATIVE
HCV Quantitative Log: <1.18 Log IU/mL
HCV RNA, PCR, QN: <15 IU/mL

## 2018-12-02 LAB — TSH: TSH: 0.74 mIU/L (ref 0.40–4.50)

## 2018-12-03 ENCOUNTER — Encounter: Payer: Self-pay | Admitting: Family Medicine

## 2018-12-15 DIAGNOSIS — G4733 Obstructive sleep apnea (adult) (pediatric): Secondary | ICD-10-CM | POA: Diagnosis not present

## 2018-12-15 DIAGNOSIS — Z79891 Long term (current) use of opiate analgesic: Secondary | ICD-10-CM | POA: Diagnosis not present

## 2018-12-15 DIAGNOSIS — M542 Cervicalgia: Secondary | ICD-10-CM | POA: Diagnosis not present

## 2018-12-15 DIAGNOSIS — M797 Fibromyalgia: Secondary | ICD-10-CM | POA: Diagnosis not present

## 2018-12-15 DIAGNOSIS — M545 Low back pain: Secondary | ICD-10-CM | POA: Diagnosis not present

## 2018-12-21 ENCOUNTER — Other Ambulatory Visit: Payer: Self-pay | Admitting: Family Medicine

## 2018-12-21 DIAGNOSIS — R0609 Other forms of dyspnea: Secondary | ICD-10-CM

## 2019-01-26 ENCOUNTER — Other Ambulatory Visit: Payer: Medicare HMO

## 2019-01-27 ENCOUNTER — Ambulatory Visit: Payer: Medicare HMO | Attending: Internal Medicine

## 2019-01-27 ENCOUNTER — Other Ambulatory Visit: Payer: Self-pay | Admitting: Family Medicine

## 2019-01-27 ENCOUNTER — Other Ambulatory Visit: Payer: Self-pay

## 2019-01-27 DIAGNOSIS — Z20822 Contact with and (suspected) exposure to covid-19: Secondary | ICD-10-CM

## 2019-01-27 DIAGNOSIS — N3281 Overactive bladder: Secondary | ICD-10-CM

## 2019-01-28 LAB — NOVEL CORONAVIRUS, NAA: SARS-CoV-2, NAA: NOT DETECTED

## 2019-02-28 ENCOUNTER — Other Ambulatory Visit: Payer: Self-pay | Admitting: Family Medicine

## 2019-02-28 DIAGNOSIS — R0609 Other forms of dyspnea: Secondary | ICD-10-CM

## 2019-04-27 DIAGNOSIS — M797 Fibromyalgia: Secondary | ICD-10-CM | POA: Diagnosis not present

## 2019-04-27 DIAGNOSIS — R413 Other amnesia: Secondary | ICD-10-CM | POA: Diagnosis not present

## 2019-04-27 DIAGNOSIS — Z79891 Long term (current) use of opiate analgesic: Secondary | ICD-10-CM | POA: Diagnosis not present

## 2019-04-27 DIAGNOSIS — G43701 Chronic migraine without aura, not intractable, with status migrainosus: Secondary | ICD-10-CM | POA: Diagnosis not present

## 2019-04-27 DIAGNOSIS — M545 Low back pain: Secondary | ICD-10-CM | POA: Diagnosis not present

## 2019-04-27 DIAGNOSIS — M542 Cervicalgia: Secondary | ICD-10-CM | POA: Diagnosis not present

## 2019-06-17 DIAGNOSIS — M533 Sacrococcygeal disorders, not elsewhere classified: Secondary | ICD-10-CM | POA: Diagnosis not present

## 2019-06-17 DIAGNOSIS — M545 Low back pain: Secondary | ICD-10-CM | POA: Diagnosis not present

## 2019-06-30 ENCOUNTER — Other Ambulatory Visit: Payer: Self-pay | Admitting: Family Medicine

## 2019-06-30 DIAGNOSIS — N3281 Overactive bladder: Secondary | ICD-10-CM

## 2019-07-25 DIAGNOSIS — M542 Cervicalgia: Secondary | ICD-10-CM | POA: Diagnosis not present

## 2019-07-25 DIAGNOSIS — G43701 Chronic migraine without aura, not intractable, with status migrainosus: Secondary | ICD-10-CM | POA: Diagnosis not present

## 2019-07-25 DIAGNOSIS — Z79891 Long term (current) use of opiate analgesic: Secondary | ICD-10-CM | POA: Diagnosis not present

## 2019-07-25 DIAGNOSIS — G894 Chronic pain syndrome: Secondary | ICD-10-CM | POA: Diagnosis not present

## 2019-07-25 DIAGNOSIS — M797 Fibromyalgia: Secondary | ICD-10-CM | POA: Diagnosis not present

## 2019-07-25 DIAGNOSIS — M545 Low back pain: Secondary | ICD-10-CM | POA: Diagnosis not present

## 2019-07-26 ENCOUNTER — Other Ambulatory Visit: Payer: Self-pay | Admitting: Family Medicine

## 2019-07-26 DIAGNOSIS — R0609 Other forms of dyspnea: Secondary | ICD-10-CM

## 2019-07-27 NOTE — Telephone Encounter (Signed)
Okay to refill, please schedule an OV

## 2019-07-27 NOTE — Telephone Encounter (Signed)
Last OV: 11/26/2018

## 2019-08-28 ENCOUNTER — Emergency Department (HOSPITAL_COMMUNITY): Payer: Medicare Other

## 2019-08-28 ENCOUNTER — Emergency Department (HOSPITAL_COMMUNITY)
Admission: EM | Admit: 2019-08-28 | Discharge: 2019-08-29 | Disposition: A | Discharge status: Home / Self Care | Payer: Medicare Other | Attending: Emergency Medicine | Admitting: Emergency Medicine

## 2019-08-28 ENCOUNTER — Other Ambulatory Visit: Payer: Self-pay

## 2019-08-28 ENCOUNTER — Encounter (HOSPITAL_COMMUNITY): Payer: Self-pay | Admitting: Emergency Medicine

## 2019-08-28 DIAGNOSIS — Z7951 Long term (current) use of inhaled steroids: Secondary | ICD-10-CM | POA: Insufficient documentation

## 2019-08-28 DIAGNOSIS — R509 Fever, unspecified: Secondary | ICD-10-CM | POA: Diagnosis not present

## 2019-08-28 DIAGNOSIS — F17210 Nicotine dependence, cigarettes, uncomplicated: Secondary | ICD-10-CM | POA: Insufficient documentation

## 2019-08-28 DIAGNOSIS — Z20822 Contact with and (suspected) exposure to covid-19: Secondary | ICD-10-CM | POA: Insufficient documentation

## 2019-08-28 DIAGNOSIS — J439 Emphysema, unspecified: Secondary | ICD-10-CM | POA: Diagnosis not present

## 2019-08-28 DIAGNOSIS — J449 Chronic obstructive pulmonary disease, unspecified: Secondary | ICD-10-CM | POA: Diagnosis not present

## 2019-08-28 DIAGNOSIS — E119 Type 2 diabetes mellitus without complications: Secondary | ICD-10-CM | POA: Insufficient documentation

## 2019-08-28 DIAGNOSIS — R112 Nausea with vomiting, unspecified: Secondary | ICD-10-CM | POA: Diagnosis not present

## 2019-08-28 DIAGNOSIS — R1110 Vomiting, unspecified: Secondary | ICD-10-CM | POA: Diagnosis present

## 2019-08-28 LAB — CBC
HCT: 53.3 % — ABNORMAL HIGH (ref 36.0–46.0)
Hemoglobin: 16.6 g/dL — ABNORMAL HIGH (ref 12.0–15.0)
MCH: 28.5 pg (ref 26.0–34.0)
MCHC: 31.1 g/dL (ref 30.0–36.0)
MCV: 91.4 fL (ref 80.0–100.0)
Platelets: 293 10*3/uL (ref 150–400)
RBC: 5.83 MIL/uL — ABNORMAL HIGH (ref 3.87–5.11)
RDW: 15 % (ref 11.5–15.5)
WBC: 12.3 10*3/uL — ABNORMAL HIGH (ref 4.0–10.5)
nRBC: 0 % (ref 0.0–0.2)

## 2019-08-28 LAB — COMPREHENSIVE METABOLIC PANEL
ALT: 16 U/L (ref 0–44)
AST: 21 U/L (ref 15–41)
Albumin: 4.1 g/dL (ref 3.5–5.0)
Alkaline Phosphatase: 74 U/L (ref 38–126)
Anion gap: 9 (ref 5–15)
BUN: 10 mg/dL (ref 8–23)
CO2: 27 mmol/L (ref 22–32)
Calcium: 9 mg/dL (ref 8.9–10.3)
Chloride: 102 mmol/L (ref 98–111)
Creatinine, Ser: 0.39 mg/dL — ABNORMAL LOW (ref 0.44–1.00)
GFR calc Af Amer: >60 mL/min (ref >60)
GFR calc non Af Amer: >60 mL/min (ref >60)
Glucose, Bld: 126 mg/dL — ABNORMAL HIGH (ref 70–99)
Potassium: 3.9 mmol/L (ref 3.5–5.1)
Sodium: 138 mmol/L (ref 135–145)
Total Bilirubin: 0.2 mg/dL — ABNORMAL LOW (ref 0.3–1.2)
Total Protein: 8.1 g/dL (ref 6.5–8.1)

## 2019-08-28 LAB — LIPASE, BLOOD: Lipase: 20 U/L (ref 11–51)

## 2019-08-28 LAB — SARS CORONAVIRUS 2 BY RT PCR (HOSPITAL ORDER, PERFORMED IN ~~LOC~~ HOSPITAL LAB): SARS Coronavirus 2: NEGATIVE

## 2019-08-28 MED ORDER — ONDANSETRON HCL 4 MG/2ML IJ SOLN
4.0000 mg | Freq: Once | INTRAMUSCULAR | Status: AC
  Administered 2019-08-28: 4 mg via INTRAVENOUS
  Filled 2019-08-28: qty 2

## 2019-08-28 MED ORDER — ONDANSETRON HCL 8 MG PO TABS
8.0000 mg | ORAL_TABLET | Freq: Three times a day (TID) | ORAL | 0 refills | Status: DC | PRN
Start: 2019-08-28 — End: 2021-05-23

## 2019-08-28 MED ORDER — SODIUM CHLORIDE 0.9 % IV BOLUS
1000.0000 mL | Freq: Once | INTRAVENOUS | Status: AC
  Administered 2019-08-28: 1000 mL via INTRAVENOUS

## 2019-08-28 NOTE — Discharge Instructions (Signed)
Start with clear liquids and gradually advance her diet over a day or 2. See your doctor if not better in a couple of days.

## 2019-08-28 NOTE — ED Provider Notes (Signed)
[facility] EMERGENCY DEPARTMENT Provider Note   CSN: [id number] Arrival date & time: 08/28/19  1832     History Chief Complaint  Patient presents with  . Emesis    Patricia Jones is a 64 y.o. female.  HPI She presents for evaluation abdominal pain and vomiting.  Onset since this morning.  Numerous episodes of vomiting, without diarrhea.  No blood in emesis.  She denies fever.  She has not had Covid vaccines.  No known sick contacts.  She is a cigarette smoker.  She denies weakness or dizziness.  She reports she is feeling better since arrival in the ED.  There are no other known modifying factors.    Past Medical History:  Diagnosis Date  . Allergy   . Chronic migraine without aura, with intractable migraine, so stated, without mention of status migrainosus 12/08/2012  . Depression   . DJD (degenerative joint disease)   . Fibromyalgia   . GERD (gastroesophageal reflux disease)   . Hepatitis C 1980   symptomatic w/ jaundice, NEGATIVE HCV RNA  08/2005, positive hepatitis C antibody but negative HCVRNA in 2019  . Hypersomnia with sleep apnea, unspecified   . Insomnia   . Lumbago 12/08/2012  . Migraines   . Myalgia and myositis, unspecified 12/08/2012  . Overactive bladder   . S/P colonoscopy 10/29/1999   Dr Arita Miss polyps-hyperplastic  . S/P colonoscopy 09/12/2005   4 hyperplastic polyps  . Sleep apnea    CPAP  . Tobacco dependence   . Type II or unspecified type diabetes mellitus without mention of complication, not stated as uncontrolled   . Unspecified hereditary and idiopathic peripheral neuropathy     Patient Active Problem List   Diagnosis Date Noted  . Thyroid disorder 11/26/2018  . COPD (chronic obstructive pulmonary disease) ([place]) 11/26/2018  . Cecal polyp   . Hyperplastic polyp of ascending colon   . Erosive gastritis   . Esophageal dysphagia 08/11/2017  . Thyroid goiter 05/18/2017  . Hypersomnia with sleep apnea 11/03/2013  . Fibromyalgia 11/03/2013    . Idiopathic peripheral neuropathy 11/03/2013  . Diabetes mellitus, type 2 ([place]) 11/03/2013  . Overactive bladder 11/03/2013  . Lumbar radiculopathy 11/03/2013  . Chronic migraine without aura, with intractable migraine, so stated, without mention of status migrainosus 12/08/2012  . Osteoporosis 12/03/2010  . Tobacco dependence   . Insomnia   . GERD (gastroesophageal reflux disease) 07/11/2010  . Hx of colonic polyps 07/11/2010  . History of hepatitis C 07/11/2010    Past Surgical History:  Procedure Laterality Date  . ABDOMINAL HYSTERECTOMY  10/30/1999   TAH/BSO  . COLONOSCOPY     2007, 2001, hyperplastic polyps  . COLONOSCOPY WITH PROPOFOL N/A 10/20/2017   Procedure: COLONOSCOPY WITH PROPOFOL;  Surgeon: Danie Binder, MD;  [facility];  Service: Endoscopy;  Laterality: N/A;  10:45am  . complete hyst  2001  . ESOPHAGOGASTRODUODENOSCOPY (EGD) WITH PROPOFOL N/A 10/20/2017   Procedure: ESOPHAGOGASTRODUODENOSCOPY (EGD) WITH PROPOFOL;  Surgeon: Danie Binder, MD;  Location: [facility];  Service: Endoscopy;  Laterality: N/A;  . EXPLORATORY LAPAROTOMY     Ruptured ovarian cyst with hemorrhage in her 1s  . FOOT SURGERY Left   . HIP SURGERY     laproscopic left  . PELVIC LAPAROSCOPY     Multiple for ovarian cysts and endometriosis, infertility issues  . POLYPECTOMY  10/20/2017   Procedure: POLYPECTOMY;  Surgeon: Danie Binder, MD;  Location: [facility];  Service: Endoscopy;;  .  SAVORY DILATION N/A 10/20/2017   Procedure: SAVORY DILATION;  Surgeon: Danie Binder, MD;  Location: [facility];  Service: Endoscopy;  Laterality: N/A;  . SINUS SURGERY WITH INSTATRAK       OB History    Gravida  3   Para  2   Term  2   Preterm      AB  1   Living  2     SAB  1   TAB      Ectopic      Multiple      Live Births  2           Family History  Problem Relation Age of Onset  . Aneurysm Mother   . Heart disease Mother   . Coronary artery  disease Father   . Stroke Father   . Heart disease Father   . Cervical cancer Sister   . Ovarian cancer Sister   . Colon cancer Maternal Grandmother        greater than age 72. was her great grandmother  . Diabetes Maternal Aunt     Social History   Tobacco Use  . Smoking status: Current Every Day Smoker    Packs/day: 2.00    Years: 40.00    Pack years: 80.00    Types: Cigarettes    Start date: 12/25/1973  . Smokeless tobacco: Never Used  Vaping Use  . Vaping Use: Never used  Substance Use Topics  . Alcohol use: No    Comment: heavy etoh x 30 yrs, quit 14 yrs ago  . Drug use: Yes    Types: Marijuana    Comment: last use 08/27/19    Home Medications Prior to Admission medications   Medication Sig Start Date End Date Taking? Authorizing Provider  AIMOVIG 70 MG/ML SOAJ Inject 70 mg into the skin every 30 (thirty) days.  04/22/17   [provider]  albuterol (PROVENTIL) (2.5 MG/3ML) 0.083% nebulizer solution Take 3 mLs (2.5 mg total) by nebulization every 6 (six) hours as needed for wheezing or shortness of breath. 11/04/17   Delsa Grana, PA-C  albuterol (VENTOLIN HFA) 108 (90 Base) MCG/ACT inhaler INHALE 2 PUFFS INTO THE LUNGS EVERY 4 HOURS AS NEEDED FOR WHEEZING OR SHORTNESS OF BREATH. 07/27/19   Alycia Rossetti, MD  amitriptyline (ELAVIL) 10 MG tablet Take 20 mg by mouth at bedtime.  05/12/17   [provider]  baclofen (LIORESAL) 10 MG tablet Take 10 mg by mouth 3 (three) times daily as needed for muscle spasms.     [provider]  BREO ELLIPTA 100-25 MCG/INH AEPB INHALE 1 PUFF INTO LUNGS DAILY. 07/27/19   [place], Modena Nunnery, MD  buprenorphine (SUBUTEX) 2 MG SUBL SL tablet Place 2-4 mg under the tongue daily as needed (for pain).  09/16/17   [provider]  butalbital-acetaminophen-caffeine (FIORICET, ESGIC) 50-325-40 MG per tablet Take 1-2 tablets by mouth every 4 (four) hours as needed for headache or migraine.  07/09/10   [provider]  Calcium Carbonate-Vitamin D (SM CALCIUM 500/VITAMIN D3 PO) Take 1 tablet by mouth daily.    [provider]  Cholecalciferol (VITAMIN D) 2000 UNITS tablet Take 2,000 Units by mouth daily.    [provider]  diazepam (VALIUM) 10 MG tablet Take 10 mg by mouth at bedtime as needed for anxiety.  06/15/15   [provider]  FLUoxetine (PROZAC) 40 MG capsule Take 40 mg by mouth at bedtime.  06/17/10  [provider]  fluticasone (FLONASE) 50 MCG/ACT nasal spray Place 2 sprays into both nostrils daily. 11/26/18   [place], Modena Nunnery, MD  gabapentin (NEURONTIN) 600 MG tablet Take 600 mg by mouth 3 (three) times daily as needed (for pain).     [provider]  MYRBETRIQ 25 MG TB24 tablet TAKE 1 TABLET BY MOUTH ONCE A DAY. 06/30/19   Alycia Rossetti, MD  ondansetron (ZOFRAN) 8 MG tablet Take 1 tablet (8 mg total) by mouth every 8 (eight) hours as needed for nausea or vomiting. 08/28/19   Daleen Bo, MD  pantoprazole (PROTONIX) 40 MG tablet 1 po 30 mins prior to first meal 10/20/17   [place], Marga Melnick, MD  SUMAtriptan (IMITREX) 6 MG/0.5ML SOLN Inject 6 mg into the skin every 2 (two) hours as needed for migraine or headache.  07/09/10   [provider]  traMADol (ULTRAM) 50 MG tablet Take 50-100 mg by mouth every 6 (six) hours as needed for moderate pain.  06/15/15   [provider]    Allergies    Other  Review of Systems   Review of Systems  All other systems reviewed and are negative.   Physical Exam Updated Vital Signs BP 118/67   Pulse 71   Temp 98.5 F (36.9 C)   Resp 16   Ht 5\' 8"  (1.727 m)   Wt 63.5 kg   SpO2 92%   BMI 21.29 kg/m   Physical Exam Vitals and nursing note reviewed.  Constitutional:      General: She is not in acute distress.    Appearance: She is well-developed. She is ill-appearing. She is not toxic-appearing or diaphoretic.  HENT:     Head: Normocephalic and atraumatic.     Right Ear:  External ear normal.     Left Ear: External ear normal.  Eyes:     Conjunctiva/sclera: Conjunctivae normal.     Pupils: Pupils are equal, round, and reactive to light.  Neck:     Trachea: Phonation normal.  Cardiovascular:     Rate and Rhythm: Normal rate and regular rhythm.     Heart sounds: Normal heart sounds.  Pulmonary:     Effort: Pulmonary effort is normal. No respiratory distress.     Breath sounds: Normal breath sounds. No stridor.  Abdominal:     General: There is no distension.     Palpations: Abdomen is soft.     Tenderness: There is no abdominal tenderness.  Musculoskeletal:        General: No swelling or tenderness. Normal range of motion.     Cervical back: Normal range of motion and neck supple.  Skin:    General: Skin is warm and dry.  Neurological:     Mental Status: She is alert and oriented to person, place, and time.     Cranial Nerves: No cranial nerve deficit.     Sensory: No sensory deficit.     Motor: No abnormal muscle tone.     Coordination: Coordination normal.  Psychiatric:        Mood and Affect: Mood normal.        Behavior: Behavior normal.        Thought Content: Thought content normal.        Judgment: Judgment normal.     ED Results / Procedures / Treatments   Labs (all labs ordered are listed, but only abnormal results are displayed) Labs Reviewed  COMPREHENSIVE METABOLIC PANEL - Abnormal; Notable for the following  components:      Result Value   Glucose, Bld 126 (*)    Creatinine, Ser 0.39 (*)    Total Bilirubin 0.2 (*)    All other components within normal limits  CBC - Abnormal; Notable for the following components:   WBC 12.3 (*)    RBC 5.83 (*)    Hemoglobin 16.6 (*)    HCT 53.3 (*)    All other components within normal limits  SARS CORONAVIRUS 2 BY RT PCR (HOSPITAL ORDER, [place] [facility])  LIPASE, BLOOD  URINALYSIS, ROUTINE W REFLEX MICROSCOPIC    EKG None  Radiology DG Chest Port 1  View  Result Date: 08/28/2019 CLINICAL DATA:  Fever and emesis EXAM: PORTABLE CHEST 1 VIEW COMPARISON:  02/02/2018 FINDINGS: Hyperinflation with emphysematous disease. Diffuse coarse chronic interstitial opacity with linear scarring or atelectasis at the bases. No acute consolidation or pleural effusion. Stable cardiomediastinal silhouette. No pneumothorax. Scoliosis of the spine. IMPRESSION: No active disease. Hyperinflation with emphysematous disease and chronic coarse interstitial opacities. Electronically Signed   By: Donavan Foil M.D.   On: 08/28/2019 23:15    Procedures Procedures (including critical care time)  Medications Ordered in ED Medications  sodium chloride 0.9 % bolus 1,000 mL (1,000 mLs Intravenous New Bag/Given 08/28/19 2247)  ondansetron (ZOFRAN) injection 4 mg (4 mg Intravenous Given 08/28/19 2248)    ED Course  I have reviewed the triage vital signs and the nursing notes.  Pertinent labs & imaging results that were available during my care of the patient were reviewed by me and considered in my medical decision making (see chart for details).  Clinical Course as of Aug 27 2356  Nancy Fetter Aug 28, 2019  2312 Normal  Lipase, blood [EW]  2312 Normal except white count high, hemoglobin high  CBC(!) [EW]  2312 Normal except glucose high, creatinine low, total bilirubin low  Comprehensive metabolic panel(!) [EW]  0998 Hyperinflation, no edema or infiltrate, interpreted by me  DG Chest Port 1 View [EW]  2353 Normal  SARS Coronavirus 2 by RT PCR (hospital order, performed in [place] hospital lab) Nasopharyngeal Nasopharyngeal Swab [EW]    Clinical Course User Index [EW] Daleen Bo, MD   MDM Rules/Calculators/A&P                           Patient Vitals for the past 24 hrs:  BP Temp Pulse Resp SpO2 Height Weight  08/28/19 2330 118/67 -- 71 16 92 % -- --  08/28/19 2300 129/68 -- 66 16 90 % -- --  08/28/19 2250 (!) 113/58 -- -- 18 -- -- --  08/28/19 1931 111/80  98.5 F (36.9 C) 74 18 96 % 5\' 8"  (1.727 m) 63.5 kg    11:55 PM Reevaluation with update and discussion. After initial assessment and treatment, an updated evaluation reveals comfortable and ready to go home.Daleen Bo   Medical Decision Making:  This patient is presenting for evaluation of vomiting, which does require a range of treatment options, and is a complaint that involves a moderate risk of morbidity and mortality. The differential diagnoses include acute illness, metabolic instability, occult illness. I decided to review old records, and in summary middle-aged female, cigarette smoker with history of diabetes, and COPD presenting for evaluation of vomiting.  I did not require additionanegative Covid swab,istorical information from anyone.  Clinical L hemoglobin elevated, glucose high, creatinine low aboratory Tests Ordered, included CBC, Metabolic panel  and Urinalysis. Review indicates  normal lipase, slightly elevated white count,. Radiologic Tests Ordered, included chest x-ray.  I independently Visualized: Radiographic images, which show no infiltrate or edema   Critical Interventions-clinical evaluation, IV fluids, Zofran, laboratory testing, observation  Corleen C Haacke was evaluated in Emergency Department on 08/28/2019 for the symptoms described in the history of present illness. She was evaluated in the context of the global COVID-19 pandemic, which necessitated consideration that the patient might be at risk for infection with the SARS-CoV-2 virus that causes COVID-19. Institutional protocols and algorithms that pertain to the evaluation of patients at risk for COVID-19 are in a state of rapid change based on information released by regulatory bodies including the CDC and federal and state organizations. These policies and algorithms were followed during the patient's care in the ED.   After These Interventions, the Patient was reevaluated and was found improved,  tolerating fluids and comfortable enough to go home. Findings discussed with the patient. Patient with vomiting without evidence for serious bacterial infection, Covid infection, metabolic instability.  CRITICAL CARE-no Performed by: Daleen Bo  Nursing Notes Reviewed/ Care Coordinated Applicable Imaging Reviewed Interpretation of Laboratory Data incorporated into ED treatment     Final Clinical Impression(s) / ED Diagnoses Final diagnoses:  Non-intractable vomiting with nausea, unspecified vomiting type    Rx / DC Orders ED Discharge Orders         Ordered    ondansetron (ZOFRAN) 8 MG tablet  Every 8 hours PRN     Discontinue  Reprint     08/28/19 2357           Daleen Bo, MD 08/28/19 2358

## 2019-08-28 NOTE — ED Triage Notes (Signed)
Pt c/o emesis since this AM and chills. States she "has thrown up a bunch."

## 2019-09-22 ENCOUNTER — Other Ambulatory Visit: Payer: Self-pay | Admitting: Family Medicine

## 2019-09-22 DIAGNOSIS — R0609 Other forms of dyspnea: Secondary | ICD-10-CM

## 2019-10-17 DIAGNOSIS — M797 Fibromyalgia: Secondary | ICD-10-CM | POA: Diagnosis not present

## 2019-10-17 DIAGNOSIS — R413 Other amnesia: Secondary | ICD-10-CM | POA: Diagnosis not present

## 2019-10-17 DIAGNOSIS — E559 Vitamin D deficiency, unspecified: Secondary | ICD-10-CM | POA: Diagnosis not present

## 2019-10-17 DIAGNOSIS — G894 Chronic pain syndrome: Secondary | ICD-10-CM | POA: Diagnosis not present

## 2019-10-17 DIAGNOSIS — Z79891 Long term (current) use of opiate analgesic: Secondary | ICD-10-CM | POA: Diagnosis not present

## 2019-10-17 DIAGNOSIS — G43701 Chronic migraine without aura, not intractable, with status migrainosus: Secondary | ICD-10-CM | POA: Diagnosis not present

## 2019-10-17 DIAGNOSIS — M5459 Other low back pain: Secondary | ICD-10-CM | POA: Diagnosis not present

## 2019-11-03 ENCOUNTER — Other Ambulatory Visit: Payer: Self-pay | Admitting: Family Medicine

## 2019-11-03 DIAGNOSIS — R0609 Other forms of dyspnea: Secondary | ICD-10-CM

## 2019-11-08 ENCOUNTER — Other Ambulatory Visit: Payer: Self-pay

## 2019-11-08 ENCOUNTER — Ambulatory Visit (INDEPENDENT_AMBULATORY_CARE_PROVIDER_SITE_OTHER): Payer: Medicare Other | Admitting: Family Medicine

## 2019-11-08 ENCOUNTER — Encounter: Payer: Self-pay | Admitting: Family Medicine

## 2019-11-08 VITALS — BP 104/86 | HR 77 | Temp 97.9°F | Ht 68.0 in | Wt 132.2 lb

## 2019-11-08 DIAGNOSIS — E119 Type 2 diabetes mellitus without complications: Secondary | ICD-10-CM

## 2019-11-08 DIAGNOSIS — R1030 Lower abdominal pain, unspecified: Secondary | ICD-10-CM | POA: Diagnosis not present

## 2019-11-08 DIAGNOSIS — Z20822 Contact with and (suspected) exposure to covid-19: Secondary | ICD-10-CM | POA: Diagnosis not present

## 2019-11-08 DIAGNOSIS — J42 Unspecified chronic bronchitis: Secondary | ICD-10-CM

## 2019-11-08 DIAGNOSIS — N3281 Overactive bladder: Secondary | ICD-10-CM | POA: Diagnosis not present

## 2019-11-08 LAB — URINALYSIS, ROUTINE W REFLEX MICROSCOPIC
Bacteria, UA: NONE SEEN /HPF
Bilirubin Urine: NEGATIVE
Glucose, UA: NEGATIVE
Hyaline Cast: NONE SEEN /LPF
Ketones, ur: NEGATIVE
Leukocytes,Ua: NEGATIVE
Nitrite: NEGATIVE
Protein, ur: NEGATIVE
Specific Gravity, Urine: 1.014 (ref 1.001–1.03)
WBC, UA: NONE SEEN /HPF (ref 0–5)
pH: 8.5 — ABNORMAL HIGH (ref 5.0–8.0)

## 2019-11-08 LAB — MICROSCOPIC MESSAGE

## 2019-11-08 MED ORDER — PREDNISONE 20 MG PO TABS
40.0000 mg | ORAL_TABLET | Freq: Every day | ORAL | 0 refills | Status: DC
Start: 2019-11-08 — End: 2019-12-27

## 2019-11-08 MED ORDER — AMOXICILLIN-POT CLAVULANATE 875-125 MG PO TABS
1.0000 | ORAL_TABLET | Freq: Two times a day (BID) | ORAL | 0 refills | Status: DC
Start: 2019-11-08 — End: 2019-12-27

## 2019-11-08 NOTE — Assessment & Plan Note (Signed)
Pt here with viral ILLNESS in setting of COPD Swab for covid Start prednisone burst Augmentin to cover but COPD/PNA and lower abd DD UTI , less likely colitis

## 2019-11-08 NOTE — Assessment & Plan Note (Signed)
Diet controlled, no labs  > 1 year Check A1C today

## 2019-11-08 NOTE — Patient Instructions (Addendum)
We will call with lab results Use nebulizer Prednisone given for wheezing Take antibiotics Use robitussin or mucinex for cough COVID testing done, results will be available in 2 days  F/U pending results

## 2019-11-08 NOTE — Progress Notes (Signed)
   Subjective:    Patient ID: Patricia Jones, female    DOB: 1955-06-20, 64 y.o.   MRN: [id number]  Patient presents for Abdominal Pain (x1wk)  Pt here with lower abd pain for the past week  states she difficulty emptying her bladder for a while, often starts and stops. She has some dark urine  occ frequency She often has BM when she goes to urinate , but no diarrhea or change in bowels  No vaginal discharge or abdominal bleeding Multiple abd surgeries  She has had a cold symptoms, productive cough,wheezing using albuterol,inhaler and neb no fever.   She has not been vaccinated for covid  States her entire family is sick right now     She has seen  Urology in the past      Review Of Systems:  GEN- denies fatigue, fever, weight loss,weakness, recent illness HEENT- denies eye drainage, change in vision, nasal discharge, CVS- denies chest pain, palpitations RESP- denies SOB, +cough,+ wheeze ABD- denies N/V, change in stools,+ abd pain GU- + dysuria, hematuria, dribbling, incontinence MSK- denies joint pain, muscle aches, injury Neuro- denies headache, dizziness, syncope, seizure activity       Objective:    BP 104/86 (BP Location: Right Arm, Patient Position: Sitting, Cuff Size: Normal)   Pulse 77   Temp 97.9 F (36.6 C) (Oral)   Ht 5\' 8"  (1.727 m)   Wt 132 lb 3.2 oz (60 kg)   SpO2 94%   BMI 20.10 kg/m  GEN- NAD, alert and oriented x3 HEENT- PERRL, EOMI, non injected sclera, pink conjunctiva, MMM, oropharynx clear, nares clear rhinorrhea, No sinus tenderness, TM clear no effusion  Neck- Supple, no LAD  CVS- RRR, no murmur RESP-CTAB ABD-NABS,soft,mild TTP LLQ, suprapubic region, no CVA tenderness  EXT- No edema Pulses- Radial, DP- 2+        Assessment & Plan:      Problem List Items Addressed This Visit      Unprioritized   COPD (chronic obstructive pulmonary disease) ([place]) - Primary    Pt here with viral ILLNESS in setting of COPD Swab for covid Start  prednisone burst Augmentin to cover but COPD/PNA and lower abd DD UTI , less likely colitis      Relevant Medications   predniSONE (DELTASONE) 20 MG tablet   Other Relevant Orders   SARS-COV-2 RNA,(COVID-19) QUAL NAAT   Diabetes mellitus, type 2 (HCC)    Diet controlled, no labs  > 1 year Check A1C today       Relevant Orders   CBC with Differential/Platelet   Comprehensive metabolic panel   Hemoglobin A1c   Overactive bladder   Relevant Orders   Urinalysis, Routine w reflex microscopic    Other Visit Diagnoses    Encounter for laboratory testing for COVID-19 virus       Relevant Orders   SARS-COV-2 RNA,(COVID-19) QUAL NAAT   Lower abdominal pain       Check urine for infection, no sign of overt colitis, diverticulitis but augmentin would cover    Relevant Orders   Urinalysis, Routine w reflex microscopic      Note: This dictation was prepared with Dragon dictation along with smaller phrase technology. Any transcriptional errors that result from this process are unintentional.

## 2019-11-09 LAB — CBC WITH DIFFERENTIAL/PLATELET
Absolute Monocytes: 647 cells/uL (ref 200–950)
Basophils Absolute: 49 cells/uL (ref 0–200)
Basophils Relative: 0.5 %
Eosinophils Absolute: 372 cells/uL (ref 15–500)
Eosinophils Relative: 3.8 %
HCT: 47.9 % — ABNORMAL HIGH (ref 35.0–45.0)
Hemoglobin: 15.3 g/dL (ref 11.7–15.5)
Lymphs Abs: 2264 cells/uL (ref 850–3900)
MCH: 28.4 pg (ref 27.0–33.0)
MCHC: 31.9 g/dL — ABNORMAL LOW (ref 32.0–36.0)
MCV: 88.9 fL (ref 80.0–100.0)
MPV: 10.4 fL (ref 7.5–12.5)
Monocytes Relative: 6.6 %
Neutro Abs: 6468 cells/uL (ref 1500–7800)
Neutrophils Relative %: 66 %
Platelets: 277 10*3/uL (ref 140–400)
RBC: 5.39 10*6/uL — ABNORMAL HIGH (ref 3.80–5.10)
RDW: 12.5 % (ref 11.0–15.0)
Total Lymphocyte: 23.1 %
WBC: 9.8 10*3/uL (ref 3.8–10.8)

## 2019-11-09 LAB — COMPREHENSIVE METABOLIC PANEL
AG Ratio: 1.2 (calc) (ref 1.0–2.5)
ALT: 8 U/L (ref 6–29)
AST: 16 U/L (ref 10–35)
Albumin: 3.8 g/dL (ref 3.6–5.1)
Alkaline phosphatase (APISO): 75 U/L (ref 37–153)
BUN: 8 mg/dL (ref 7–25)
CO2: 32 mmol/L (ref 20–32)
Calcium: 9.3 mg/dL (ref 8.6–10.4)
Chloride: 102 mmol/L (ref 98–110)
Creat: 0.59 mg/dL (ref 0.50–0.99)
Globulin: 3.3 g/dL (calc) (ref 1.9–3.7)
Glucose, Bld: 86 mg/dL (ref 65–99)
Potassium: 4.1 mmol/L (ref 3.5–5.3)
Sodium: 140 mmol/L (ref 135–146)
Total Bilirubin: 0.5 mg/dL (ref 0.2–1.2)
Total Protein: 7.1 g/dL (ref 6.1–8.1)

## 2019-11-09 LAB — HEMOGLOBIN A1C
Hgb A1c MFr Bld: 5.5 % of total Hgb (ref <5.7)
Mean Plasma Glucose: 111 (calc)
eAG (mmol/L): 6.2 (calc)

## 2019-11-09 LAB — SARS-COV-2 RNA,(COVID-19) QUALITATIVE NAAT: SARS CoV2 RNA: NOT DETECTED

## 2019-11-28 ENCOUNTER — Other Ambulatory Visit: Payer: Self-pay | Admitting: Family Medicine

## 2019-11-28 DIAGNOSIS — N3281 Overactive bladder: Secondary | ICD-10-CM

## 2019-12-27 ENCOUNTER — Ambulatory Visit (INDEPENDENT_AMBULATORY_CARE_PROVIDER_SITE_OTHER): Payer: Medicare Other | Admitting: Family Medicine

## 2019-12-27 ENCOUNTER — Other Ambulatory Visit: Payer: Self-pay

## 2019-12-27 ENCOUNTER — Encounter: Payer: Self-pay | Admitting: Family Medicine

## 2019-12-27 VITALS — BP 128/64 | HR 82 | Temp 97.9°F | Resp 16 | Ht 68.0 in | Wt 138.0 lb

## 2019-12-27 DIAGNOSIS — Z20822 Contact with and (suspected) exposure to covid-19: Secondary | ICD-10-CM

## 2019-12-27 DIAGNOSIS — J441 Chronic obstructive pulmonary disease with (acute) exacerbation: Secondary | ICD-10-CM

## 2019-12-27 DIAGNOSIS — R509 Fever, unspecified: Secondary | ICD-10-CM | POA: Diagnosis not present

## 2019-12-27 MED ORDER — DOXYCYCLINE HYCLATE 100 MG PO TABS: 100.0000 mg | ORAL_TABLET | Freq: Two times a day (BID) | ORAL | 0 refills | Status: DC

## 2019-12-27 MED ORDER — PREDNISONE 10 MG PO TABS
ORAL_TABLET | ORAL | 0 refills | Status: DC
Start: 2019-12-27 — End: 2021-02-15

## 2019-12-27 MED ORDER — BENZONATATE 200 MG PO CAPS
200.0000 mg | ORAL_CAPSULE | Freq: Three times a day (TID) | ORAL | 0 refills | Status: DC | PRN
Start: 2019-12-27 — End: 2021-02-15

## 2019-12-27 NOTE — Progress Notes (Signed)
   Subjective:    Patient ID: Patricia Jones, female    DOB: [birth year], 64 y.o.   MRN: [id number]  Patient presents for Illness (X1 week- cough, chest congestion, nasal congestion, SOB)  Patient here with cough with production wheezing sinus pressure and congestion for the past week.  She does have history of underlying COPD.  States she has been using her albuterol as well as some over-the-counter medicines with minimal improvement.  She does not think that she has had any fever but she has had some mild body aches and chills.  Her breathing has not been improving and she does feel tight in her chest.  She states that her grandchildren have been sick.  She has not had flu or Covid vaccine.  She denies any chest pain no nausea vomiting or diarrhea   Review Of Systems:  GEN- denies fatigue, fever, weight loss,weakness, recent illness HEENT- denies eye drainage, change in vision,+ nasal discharge, CVS- denies chest pain, palpitations RESP- + SOB, +cough, +wheeze ABD- denies N/V, change in stools, abd pain GU- denies dysuria, hematuria, dribbling, incontinence MSK- denies joint pain,+ muscle aches, injury Neuro- denies headache, dizziness, syncope, seizure activity       Objective:    BP 128/64   Pulse 82   Temp 97.9 F (36.6 C) (Temporal)   Resp 16   Ht 5\' 8"  (1.727 m)   Wt 138 lb (62.6 kg)   SpO2 96%   BMI 20.98 kg/m  GEN- NAD, alert and oriented x3, ill appearing  HEENT- PERRL, EOMI, non injected sclera, pink conjunctiva, MMM, oropharynx mild injection, TM clear bilat no effusion,  No maxillary sinus tenderness, inflammed turbinates,  Nasal drainage  Neck- Supple, no LAD CVS- RRR, no murmur RESP-bilat wheeze, mild rhonchi, normal WOB, no retractions ABD-NABS,soft,nt,nd EXT- No edema Pulses- Radial 2+        Assessment & Plan:      Problem List Items Addressed This Visit   None   Visit Diagnoses    Encounter for laboratory testing for COVID-19 virus    -   Primary   Relevant Orders   COVID19 and Influenza A & B   COPD exacerbation (HCC)       Treat with tessalon perrles, prednisone taper, doxycycline, covid/flu swab done, oxygen sat normal. discussed red flags    Relevant Medications   predniSONE (DELTASONE) 10 MG tablet   benzonatate (TESSALON) 200 MG capsule      Note: This dictation was prepared with Dragon dictation along with smaller phrase technology. Any transcriptional errors that result from this process are unintentional.

## 2019-12-29 LAB — SARS-COVID-2 RNA(COVID19)AND INFLUENZA A&B, QUALITATIVE NAAT
FLU A: NOT DETECTED
FLU B: NOT DETECTED
SARS CoV2 RNA: NOT DETECTED

## 2020-01-02 ENCOUNTER — Other Ambulatory Visit: Payer: Self-pay | Admitting: Family Medicine

## 2020-01-02 DIAGNOSIS — R0609 Other forms of dyspnea: Secondary | ICD-10-CM

## 2020-01-11 ENCOUNTER — Other Ambulatory Visit: Payer: Self-pay | Admitting: *Deleted

## 2020-01-11 DIAGNOSIS — R0609 Other forms of dyspnea: Secondary | ICD-10-CM

## 2020-01-11 MED ORDER — BREO ELLIPTA 100-25 MCG/INH IN AEPB: INHALATION_SPRAY | RESPIRATORY_TRACT | 0 refills | Status: DC

## 2020-01-11 MED ORDER — ALBUTEROL SULFATE HFA 108 (90 BASE) MCG/ACT IN AERS: INHALATION_SPRAY | RESPIRATORY_TRACT | 0 refills | Status: AC

## 2020-01-13 ENCOUNTER — Other Ambulatory Visit: Payer: Self-pay | Admitting: Family Medicine

## 2020-01-16 DIAGNOSIS — G4700 Insomnia, unspecified: Secondary | ICD-10-CM | POA: Diagnosis not present

## 2020-01-16 DIAGNOSIS — G894 Chronic pain syndrome: Secondary | ICD-10-CM | POA: Diagnosis not present

## 2020-01-16 DIAGNOSIS — M542 Cervicalgia: Secondary | ICD-10-CM | POA: Diagnosis not present

## 2020-01-16 DIAGNOSIS — G43701 Chronic migraine without aura, not intractable, with status migrainosus: Secondary | ICD-10-CM | POA: Diagnosis not present

## 2020-01-16 DIAGNOSIS — M5459 Other low back pain: Secondary | ICD-10-CM | POA: Diagnosis not present

## 2020-01-16 DIAGNOSIS — M797 Fibromyalgia: Secondary | ICD-10-CM | POA: Diagnosis not present

## 2020-01-16 DIAGNOSIS — Z79891 Long term (current) use of opiate analgesic: Secondary | ICD-10-CM | POA: Diagnosis not present

## 2020-01-16 DIAGNOSIS — R413 Other amnesia: Secondary | ICD-10-CM | POA: Diagnosis not present

## 2020-02-04 ENCOUNTER — Other Ambulatory Visit: Payer: Self-pay | Admitting: Family Medicine

## 2020-02-04 DIAGNOSIS — N3281 Overactive bladder: Secondary | ICD-10-CM

## 2020-03-19 DIAGNOSIS — R413 Other amnesia: Secondary | ICD-10-CM | POA: Diagnosis not present

## 2020-03-19 DIAGNOSIS — G894 Chronic pain syndrome: Secondary | ICD-10-CM | POA: Diagnosis not present

## 2020-03-19 DIAGNOSIS — G43701 Chronic migraine without aura, not intractable, with status migrainosus: Secondary | ICD-10-CM | POA: Diagnosis not present

## 2020-03-19 DIAGNOSIS — M5459 Other low back pain: Secondary | ICD-10-CM | POA: Diagnosis not present

## 2020-03-19 DIAGNOSIS — G4700 Insomnia, unspecified: Secondary | ICD-10-CM | POA: Diagnosis not present

## 2020-03-19 DIAGNOSIS — Z79891 Long term (current) use of opiate analgesic: Secondary | ICD-10-CM | POA: Diagnosis not present

## 2020-03-19 DIAGNOSIS — E559 Vitamin D deficiency, unspecified: Secondary | ICD-10-CM | POA: Diagnosis not present

## 2020-03-19 DIAGNOSIS — M797 Fibromyalgia: Secondary | ICD-10-CM | POA: Diagnosis not present

## 2020-04-12 DIAGNOSIS — F32A Depression, unspecified: Secondary | ICD-10-CM | POA: Diagnosis not present

## 2020-04-12 DIAGNOSIS — M797 Fibromyalgia: Secondary | ICD-10-CM | POA: Diagnosis not present

## 2020-04-12 DIAGNOSIS — Z6822 Body mass index (BMI) 22.0-22.9, adult: Secondary | ICD-10-CM | POA: Diagnosis not present

## 2020-04-12 DIAGNOSIS — F17210 Nicotine dependence, cigarettes, uncomplicated: Secondary | ICD-10-CM | POA: Diagnosis not present

## 2020-04-12 DIAGNOSIS — M79673 Pain in unspecified foot: Secondary | ICD-10-CM | POA: Diagnosis not present

## 2020-04-12 DIAGNOSIS — Z299 Encounter for prophylactic measures, unspecified: Secondary | ICD-10-CM | POA: Diagnosis not present

## 2020-04-12 DIAGNOSIS — G43909 Migraine, unspecified, not intractable, without status migrainosus: Secondary | ICD-10-CM | POA: Diagnosis not present

## 2020-04-18 ENCOUNTER — Ambulatory Visit: Payer: Medicare Other | Admitting: Internal Medicine

## 2020-06-01 ENCOUNTER — Other Ambulatory Visit: Payer: Self-pay | Admitting: Family Medicine

## 2020-06-01 DIAGNOSIS — R0600 Dyspnea, unspecified: Secondary | ICD-10-CM

## 2020-06-01 DIAGNOSIS — R0609 Other forms of dyspnea: Secondary | ICD-10-CM

## 2020-06-22 DIAGNOSIS — G43909 Migraine, unspecified, not intractable, without status migrainosus: Secondary | ICD-10-CM | POA: Diagnosis not present

## 2020-06-22 DIAGNOSIS — R339 Retention of urine, unspecified: Secondary | ICD-10-CM | POA: Diagnosis not present

## 2020-06-22 DIAGNOSIS — N3281 Overactive bladder: Secondary | ICD-10-CM | POA: Diagnosis not present

## 2020-06-22 DIAGNOSIS — F17210 Nicotine dependence, cigarettes, uncomplicated: Secondary | ICD-10-CM | POA: Diagnosis not present

## 2020-06-22 DIAGNOSIS — Z299 Encounter for prophylactic measures, unspecified: Secondary | ICD-10-CM | POA: Diagnosis not present

## 2020-06-22 DIAGNOSIS — M797 Fibromyalgia: Secondary | ICD-10-CM | POA: Diagnosis not present

## 2020-07-11 DIAGNOSIS — M797 Fibromyalgia: Secondary | ICD-10-CM | POA: Diagnosis not present

## 2020-07-11 DIAGNOSIS — M5459 Other low back pain: Secondary | ICD-10-CM | POA: Diagnosis not present

## 2020-07-11 DIAGNOSIS — E559 Vitamin D deficiency, unspecified: Secondary | ICD-10-CM | POA: Diagnosis not present

## 2020-07-11 DIAGNOSIS — G3184 Mild cognitive impairment, so stated: Secondary | ICD-10-CM | POA: Diagnosis not present

## 2020-07-11 DIAGNOSIS — G4700 Insomnia, unspecified: Secondary | ICD-10-CM | POA: Diagnosis not present

## 2020-07-11 DIAGNOSIS — Z79899 Other long term (current) drug therapy: Secondary | ICD-10-CM | POA: Diagnosis not present

## 2020-07-11 DIAGNOSIS — G894 Chronic pain syndrome: Secondary | ICD-10-CM | POA: Diagnosis not present

## 2020-07-11 DIAGNOSIS — G43701 Chronic migraine without aura, not intractable, with status migrainosus: Secondary | ICD-10-CM | POA: Diagnosis not present

## 2020-07-11 DIAGNOSIS — R413 Other amnesia: Secondary | ICD-10-CM | POA: Diagnosis not present

## 2020-08-14 ENCOUNTER — Other Ambulatory Visit: Payer: Self-pay | Admitting: Family Medicine

## 2020-08-14 DIAGNOSIS — R0600 Dyspnea, unspecified: Secondary | ICD-10-CM

## 2020-08-14 DIAGNOSIS — R0609 Other forms of dyspnea: Secondary | ICD-10-CM

## 2020-09-11 ENCOUNTER — Other Ambulatory Visit: Payer: Self-pay | Admitting: Family Medicine

## 2020-09-11 DIAGNOSIS — R0600 Dyspnea, unspecified: Secondary | ICD-10-CM

## 2020-09-11 DIAGNOSIS — R0609 Other forms of dyspnea: Secondary | ICD-10-CM

## 2020-09-13 ENCOUNTER — Encounter: Payer: Self-pay | Admitting: *Deleted

## 2020-09-13 ENCOUNTER — Other Ambulatory Visit: Payer: Self-pay | Admitting: Family Medicine

## 2020-09-13 DIAGNOSIS — R0609 Other forms of dyspnea: Secondary | ICD-10-CM

## 2020-09-13 DIAGNOSIS — R0600 Dyspnea, unspecified: Secondary | ICD-10-CM

## 2020-09-29 ENCOUNTER — Other Ambulatory Visit: Payer: Self-pay

## 2020-09-29 ENCOUNTER — Emergency Department (HOSPITAL_COMMUNITY)
Admission: EM | Admit: 2020-09-29 | Discharge: 2020-09-30 | Disposition: A | Discharge status: Home / Self Care | Payer: Medicare Other | Attending: Emergency Medicine | Admitting: Emergency Medicine

## 2020-09-29 DIAGNOSIS — H02843 Edema of right eye, unspecified eyelid: Secondary | ICD-10-CM | POA: Insufficient documentation

## 2020-09-29 DIAGNOSIS — Z5321 Procedure and treatment not carried out due to patient leaving prior to being seen by health care provider: Secondary | ICD-10-CM | POA: Diagnosis not present

## 2020-09-29 DIAGNOSIS — H5711 Ocular pain, right eye: Secondary | ICD-10-CM | POA: Insufficient documentation

## 2020-09-29 DIAGNOSIS — H5442A3 Blindness left eye category 3, normal vision right eye: Secondary | ICD-10-CM | POA: Insufficient documentation

## 2020-09-30 ENCOUNTER — Encounter (HOSPITAL_COMMUNITY): Payer: Self-pay | Admitting: Emergency Medicine

## 2020-09-30 NOTE — ED Triage Notes (Addendum)
Pt with c/o R eye pain and burning. Denies any known injury. R eye swollen. Pt states she is legally blind in L eye.

## 2020-10-10 DIAGNOSIS — M797 Fibromyalgia: Secondary | ICD-10-CM | POA: Diagnosis not present

## 2020-10-10 DIAGNOSIS — G894 Chronic pain syndrome: Secondary | ICD-10-CM | POA: Diagnosis not present

## 2020-10-10 DIAGNOSIS — E559 Vitamin D deficiency, unspecified: Secondary | ICD-10-CM | POA: Diagnosis not present

## 2020-10-10 DIAGNOSIS — G3184 Mild cognitive impairment, so stated: Secondary | ICD-10-CM | POA: Diagnosis not present

## 2020-10-10 DIAGNOSIS — Z79891 Long term (current) use of opiate analgesic: Secondary | ICD-10-CM | POA: Diagnosis not present

## 2020-10-10 DIAGNOSIS — R413 Other amnesia: Secondary | ICD-10-CM | POA: Diagnosis not present

## 2020-10-10 DIAGNOSIS — M5459 Other low back pain: Secondary | ICD-10-CM | POA: Diagnosis not present

## 2020-10-10 DIAGNOSIS — G43701 Chronic migraine without aura, not intractable, with status migrainosus: Secondary | ICD-10-CM | POA: Diagnosis not present

## 2020-10-10 DIAGNOSIS — G4700 Insomnia, unspecified: Secondary | ICD-10-CM | POA: Diagnosis not present

## 2020-10-18 DIAGNOSIS — Z7189 Other specified counseling: Secondary | ICD-10-CM | POA: Diagnosis not present

## 2020-10-18 DIAGNOSIS — E049 Nontoxic goiter, unspecified: Secondary | ICD-10-CM | POA: Diagnosis not present

## 2020-10-18 DIAGNOSIS — Z299 Encounter for prophylactic measures, unspecified: Secondary | ICD-10-CM | POA: Diagnosis not present

## 2020-10-18 DIAGNOSIS — Z6822 Body mass index (BMI) 22.0-22.9, adult: Secondary | ICD-10-CM | POA: Diagnosis not present

## 2020-10-18 DIAGNOSIS — Z0000 Encounter for general adult medical examination without abnormal findings: Secondary | ICD-10-CM | POA: Diagnosis not present

## 2020-10-18 DIAGNOSIS — Z23 Encounter for immunization: Secondary | ICD-10-CM | POA: Diagnosis not present

## 2020-10-18 DIAGNOSIS — M797 Fibromyalgia: Secondary | ICD-10-CM | POA: Diagnosis not present

## 2020-10-18 DIAGNOSIS — F17210 Nicotine dependence, cigarettes, uncomplicated: Secondary | ICD-10-CM | POA: Diagnosis not present

## 2020-11-12 ENCOUNTER — Other Ambulatory Visit: Payer: Self-pay | Admitting: Family Medicine

## 2020-11-13 ENCOUNTER — Other Ambulatory Visit: Payer: Self-pay

## 2020-11-20 ENCOUNTER — Other Ambulatory Visit: Payer: Self-pay | Admitting: Internal Medicine

## 2020-11-20 ENCOUNTER — Other Ambulatory Visit: Payer: Self-pay

## 2020-11-20 ENCOUNTER — Ambulatory Visit
Admission: RE | Admit: 2020-11-20 | Discharge: 2020-11-20 | Disposition: A | Discharge status: Home / Self Care | Payer: Medicare Other | Source: Ambulatory Visit | Attending: Internal Medicine | Admitting: Internal Medicine

## 2020-11-20 DIAGNOSIS — Z1231 Encounter for screening mammogram for malignant neoplasm of breast: Secondary | ICD-10-CM

## 2020-11-22 DIAGNOSIS — J439 Emphysema, unspecified: Secondary | ICD-10-CM | POA: Diagnosis not present

## 2020-11-22 DIAGNOSIS — E049 Nontoxic goiter, unspecified: Secondary | ICD-10-CM | POA: Diagnosis not present

## 2020-11-22 DIAGNOSIS — I700 Atherosclerosis of aorta: Secondary | ICD-10-CM | POA: Diagnosis not present

## 2020-11-22 DIAGNOSIS — J984 Other disorders of lung: Secondary | ICD-10-CM | POA: Diagnosis not present

## 2020-11-22 DIAGNOSIS — J449 Chronic obstructive pulmonary disease, unspecified: Secondary | ICD-10-CM | POA: Diagnosis not present

## 2020-11-22 DIAGNOSIS — Z299 Encounter for prophylactic measures, unspecified: Secondary | ICD-10-CM | POA: Diagnosis not present

## 2020-11-22 DIAGNOSIS — Z6823 Body mass index (BMI) 23.0-23.9, adult: Secondary | ICD-10-CM | POA: Diagnosis not present

## 2020-11-22 DIAGNOSIS — F17210 Nicotine dependence, cigarettes, uncomplicated: Secondary | ICD-10-CM | POA: Diagnosis not present

## 2020-11-22 DIAGNOSIS — J42 Unspecified chronic bronchitis: Secondary | ICD-10-CM | POA: Diagnosis not present

## 2020-11-22 DIAGNOSIS — E119 Type 2 diabetes mellitus without complications: Secondary | ICD-10-CM | POA: Diagnosis not present

## 2020-11-22 DIAGNOSIS — M797 Fibromyalgia: Secondary | ICD-10-CM | POA: Diagnosis not present

## 2020-11-22 DIAGNOSIS — R059 Cough, unspecified: Secondary | ICD-10-CM | POA: Diagnosis not present

## 2020-12-17 NOTE — Progress Notes (Deleted)
[facility] New Patient Evaluation and Consultation  Referring Provider: Monico Blitz, MD PCP: Pcp, No Date of Service: 12/18/2020  SUBJECTIVE Chief Complaint: No chief complaint on file.  History of Present Illness: Patricia Jones is a 65 y.o. White or Caucasian female seen in consultation at the request of Dr. Manuella Ghazi for evaluation of overactive bladder.    Review of records from Dr Manuella Ghazi significant for: Has been on Myrbetriq for overactive bladder symptoms. Has a history of pelvic fracture.   Urinary Symptoms: {urine leakage?:24754} Leaks *** time(s) per {days/wks/mos/yrs:310907}.  Pad use: {NUMBERS 1-10:18281} {pad option:24752} per day.   She {ACTION; IS/IS KGY:18563149} bothered by her UI symptoms.  Day time voids ***.  Nocturia: *** times per night to void. Voiding dysfunction: she {empties:24755} her bladder well.  {DOES NOT does:27190::"does not"} use a catheter to empty bladder.  When urinating, she feels {urine symptoms:24756} Drinks: *** per day  UTIs: {NUMBERS 1-10:18281} UTI's in the last year.   {ACTIONS;DENIES/REPORTS:21021675::"Denies"} history of {urologic concerns:24757}  Pelvic Organ Prolapse Symptoms:                  She {denies/ admits to:24761} a feeling of a bulge the vaginal area. It has been present for {NUMBER 1-10:22536} {days/wks/mos/yrs:310907}.  She {denies/ admits to:24761} seeing a bulge.  This bulge {ACTION; IS/IS FWY:63785885} bothersome.  Bowel Symptom: Bowel movements: *** time(s) per {Time; day/week/month:13537} Stool consistency: {stool consistency:24758} Straining: {yes/no:19897}.  Splinting: {yes/no:19897}.  Incomplete evacuation: {yes/no:19897}.  She {denies/ admits to:24761} accidental bowel leakage / fecal incontinence  Occurs: *** time(s) per {Time; day/week/month:13537}  Consistency with leakage: {stool consistency:24758} Bowel regimen: {bowel regimen:24759} Last colonoscopy: Date ***, Results ***  Sexual  Function Sexually active: {yes/no:19897}.  Sexual orientation: {Sexual Orientation:860 100 2158} Pain with sex: {pain with sex:24762}  Pelvic Pain {denies/ admits to:24761} pelvic pain Location: *** Pain occurs: *** Prior pain treatment: *** Improved by: *** Worsened by: ***   Past Medical History:  Past Medical History:  Diagnosis Date   Allergy    Chronic migraine without aura, with intractable migraine, so stated, without mention of status migrainosus 12/08/2012   Depression    DJD (degenerative joint disease)    Fibromyalgia    GERD (gastroesophageal reflux disease)    Hepatitis C 1980   symptomatic w/ jaundice, NEGATIVE HCV RNA  08/2005, positive hepatitis C antibody but negative HCVRNA in 2019   Hypersomnia with sleep apnea, unspecified    Insomnia    Lumbago 12/08/2012   Migraines    Myalgia and myositis, unspecified 12/08/2012   Overactive bladder    S/P colonoscopy 10/29/1999   Dr Arita Miss polyps-hyperplastic   S/P colonoscopy 09/12/2005   4 hyperplastic polyps   Sleep apnea    CPAP   Tobacco dependence    Type II or unspecified type diabetes mellitus without mention of complication, not stated as uncontrolled    Unspecified hereditary and idiopathic peripheral neuropathy      Past Surgical History:   Past Surgical History:  Procedure Laterality Date   ABDOMINAL HYSTERECTOMY  10/30/1999   TAH/BSO   COLONOSCOPY     2007, 2001, hyperplastic polyps   COLONOSCOPY WITH PROPOFOL N/A 10/20/2017   Procedure: COLONOSCOPY WITH PROPOFOL;  Surgeon: Danie Binder, MD;  Location: [facility];  Service: Endoscopy;  Laterality: N/A;  10:45am   complete hyst  2001   ESOPHAGOGASTRODUODENOSCOPY (EGD) WITH PROPOFOL N/A 10/20/2017   Procedure: ESOPHAGOGASTRODUODENOSCOPY (EGD) WITH PROPOFOL;  Surgeon: Danie Binder, MD;  Location: [facility];  Service: Endoscopy;  Laterality: N/A;   EXPLORATORY LAPAROTOMY     Ruptured ovarian cyst with hemorrhage in her 25s   FOOT SURGERY  Left    HIP SURGERY     laproscopic left   PELVIC LAPAROSCOPY     Multiple for ovarian cysts and endometriosis, infertility issues   POLYPECTOMY  10/20/2017   Procedure: POLYPECTOMY;  Surgeon: Danie Binder, MD;  Location: [facility];  Service: Endoscopy;;   SAVORY DILATION N/A 10/20/2017   Procedure: SAVORY DILATION;  Surgeon: Danie Binder, MD;  Location: [facility];  Service: Endoscopy;  Laterality: N/A;   SINUS SURGERY WITH INSTATRAK       Past OB/GYN History: G{NUMBERS 1-10:18281} P{NUMBERS 1-10:18281} Vaginal deliveries: ***,  Forceps/ Vacuum deliveries: ***, Cesarean section: *** Menopausal: {menopausal:24763} Contraception: ***. Last pap smear was ***.  Any history of abnormal pap smears: {yes/no:19897}.   Medications: She has a current medication list which includes the following prescription(s): aimovig, albuterol, albuterol, amitriptyline, baclofen, baclofen, benzonatate, breo ellipta, buprenorphine, buprenorphine, butalbital-acetaminophen-caffeine, calcium carb-cholecalciferol, vitamin d, diazepam, diazepam, doxycycline, ergocalciferol, fluoxetine, fluoxetine, fluticasone, gabapentin, gabapentin, methadone, myrbetriq, ondansetron, pantoprazole, prednisone, sumatriptan, tramadol, and tramadol.   Allergies: Patient is allergic to other.   Social History:  Social History   Tobacco Use   Smoking status: Every Day    Packs/day: 2.00    Years: 40.00    Pack years: 80.00    Types: Cigarettes    Start date: 12/25/1973   Smokeless tobacco: Never  Vaping Use   Vaping Use: Never used  Substance Use Topics   Alcohol use: No    Comment: heavy etoh x 30 yrs, quit 14 yrs ago   Drug use: Yes    Types: Marijuana    Comment: last use 08/27/19    Relationship status: {relationship status:24764} She lives with ***.   She {ACTION; IS/IS JYN:82956213} employed ***. Regular exercise: {Yes/No:304960894} History of abuse: {Yes/No:304960894}  Family History:   Family  History  Problem Relation Age of Onset   Aneurysm Mother    Heart disease Mother    Coronary artery disease Father    Stroke Father    Heart disease Father    Cervical cancer Sister    Ovarian cancer Sister    Colon cancer Maternal Grandmother        greater than age 69. was her great grandmother   Diabetes Maternal Aunt      Review of Systems: ROS   OBJECTIVE Physical Exam: There were no vitals filed for this visit.  Physical Exam   GU / Detailed Urogynecologic Evaluation:  Pelvic Exam: Normal external female genitalia; Bartholin's and Skene's glands normal in appearance; urethral meatus normal in appearance, no urethral masses or discharge.   CST: {gen negative/positive:315881}  Reflexes: bulbocavernosis {DESC; PRESENT/NOT PRESENT:21021351}, anocutaneous {DESC; PRESENT/NOT PRESENT:21021351} ***bilaterally.  Speculum exam reveals normal vaginal mucosa {With/Without:20273} atrophy. Cervix {exam; gyn cervix:30847}. Uterus {exam; pelvic uterus:30849}. Adnexa {exam; adnexa:12223}.    s/p hysterectomy: Speculum exam reveals normal vaginal mucosa {With/Without:20273}  atrophy and normal vaginal cuff.  Adnexa {exam; adnexa:12223}.    With apex supported, anterior compartment defect was {reduced:24765}  Pelvic floor strength {Roman # I-V:19040}/V, puborectalis {Roman # I-V:19040}/V external anal sphincter {Roman # I-V:19040}/V  Pelvic floor musculature: Right levator {Tender/Non-tender:20250}, Right obturator {Tender/Non-tender:20250}, Left levator {Tender/Non-tender:20250}, Left obturator {Tender/Non-tender:20250}  POP-Q:   POP-Q  Aa                                               Ba                                                 C                                                Gh                                               Pb                                               tvl                                                 Ap                                               Bp                                                 D     Rectal Exam:  Normal sphincter tone, {rectocele:24766} distal rectocele, enterocoele {DESC; PRESENT/NOT PRESENT:21021351}, no rectal masses, {sign of:24767} dyssynergia when asking the patient to bear down.  Post-Void Residual (PVR) by Bladder Scan: In order to evaluate bladder emptying, we discussed obtaining a postvoid residual and she agreed to this procedure.  Procedure: The ultrasound unit was placed on the patient's abdomen in the suprapubic region after the patient had voided. A PVR of *** ml was obtained by bladder scan.  Laboratory Results: @ENCLABS @   ***I visualized the urine specimen, noting the specimen to be {urine color:24768}  ASSESSMENT AND PLAN Ms. Dant is a 65 y.o. with: No diagnosis found.    Jaquita Folds, MD   Medical Decision Making:  - Reviewed/ ordered a clinical laboratory test - Reviewed/ ordered a radiologic study - Reviewed/ ordered medicine test - Decision to obtain old records - Discussion of management of or test interpretation with an external physician / other healthcare professional  - Assessment requiring independent historian - Review and summation of prior records - Independent review of image, tracing or specimen

## 2020-12-18 ENCOUNTER — Ambulatory Visit: Payer: Medicare Other | Admitting: Obstetrics and Gynecology

## 2021-01-09 DIAGNOSIS — J069 Acute upper respiratory infection, unspecified: Secondary | ICD-10-CM | POA: Diagnosis not present

## 2021-01-09 DIAGNOSIS — M797 Fibromyalgia: Secondary | ICD-10-CM | POA: Diagnosis not present

## 2021-01-09 DIAGNOSIS — Z299 Encounter for prophylactic measures, unspecified: Secondary | ICD-10-CM | POA: Diagnosis not present

## 2021-01-09 DIAGNOSIS — F17210 Nicotine dependence, cigarettes, uncomplicated: Secondary | ICD-10-CM | POA: Diagnosis not present

## 2021-02-04 DIAGNOSIS — M5459 Other low back pain: Secondary | ICD-10-CM | POA: Diagnosis not present

## 2021-02-04 DIAGNOSIS — Z79891 Long term (current) use of opiate analgesic: Secondary | ICD-10-CM | POA: Diagnosis not present

## 2021-02-04 DIAGNOSIS — G43701 Chronic migraine without aura, not intractable, with status migrainosus: Secondary | ICD-10-CM | POA: Diagnosis not present

## 2021-02-04 DIAGNOSIS — G4700 Insomnia, unspecified: Secondary | ICD-10-CM | POA: Diagnosis not present

## 2021-02-14 ENCOUNTER — Other Ambulatory Visit: Payer: Self-pay | Admitting: Family Medicine

## 2021-02-15 ENCOUNTER — Other Ambulatory Visit: Payer: Self-pay

## 2021-02-15 ENCOUNTER — Encounter: Payer: Self-pay | Admitting: Obstetrics and Gynecology

## 2021-02-15 ENCOUNTER — Ambulatory Visit: Payer: Medicare Other | Admitting: Obstetrics and Gynecology

## 2021-02-15 VITALS — BP 150/97 | HR 74 | Ht 68.0 in | Wt 155.0 lb

## 2021-02-15 DIAGNOSIS — R3915 Urgency of urination: Secondary | ICD-10-CM

## 2021-02-15 DIAGNOSIS — R350 Frequency of micturition: Secondary | ICD-10-CM | POA: Diagnosis not present

## 2021-02-15 DIAGNOSIS — M62838 Other muscle spasm: Secondary | ICD-10-CM | POA: Diagnosis not present

## 2021-02-15 LAB — POCT URINALYSIS DIPSTICK
Appearance: ABNORMAL
Bilirubin, UA: NEGATIVE
Glucose, UA: NEGATIVE
Ketones, UA: NEGATIVE
Leukocytes, UA: NEGATIVE
Nitrite, UA: NEGATIVE
Protein, UA: NEGATIVE
Spec Grav, UA: 1.02 (ref 1.010–1.025)
Urobilinogen, UA: 0.2 E.U./dL
pH, UA: 8.5 — AB (ref 5.0–8.0)

## 2021-02-15 MED ORDER — SOLIFENACIN SUCCINATE 5 MG PO TABS: 5.0000 mg | ORAL_TABLET | Freq: Every day | ORAL | 5 refills | Status: DC
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
## 2021-03-01 ENCOUNTER — Other Ambulatory Visit (HOSPITAL_COMMUNITY): Payer: Self-pay | Admitting: Neurology

## 2021-03-01 DIAGNOSIS — M542 Cervicalgia: Secondary | ICD-10-CM

## 2021-03-14 ENCOUNTER — Ambulatory Visit (HOSPITAL_COMMUNITY)
Admission: RE | Admit: 2021-03-14 | Discharge: 2021-03-14 | Disposition: A | Discharge status: Home / Self Care | Payer: Medicare Other | Source: Ambulatory Visit | Attending: Neurology | Admitting: Neurology

## 2021-03-14 ENCOUNTER — Other Ambulatory Visit: Payer: Self-pay

## 2021-03-14 DIAGNOSIS — M542 Cervicalgia: Secondary | ICD-10-CM | POA: Insufficient documentation

## 2021-03-29 ENCOUNTER — Ambulatory Visit: Payer: Medicare Other | Admitting: Obstetrics and Gynecology

## 2021-03-29 NOTE — Progress Notes (Deleted)
[facility] ?Return Visit ? ?SUBJECTIVE  ?History of Present Illness: ?Patricia Jones is a 66 y.o. female seen in follow-up for OAB and levator spasm. Plan at last visit was to start vesicare '5mg'$  daily. She also was referred to pelvic PT, but no appointments made yet.  ? ? ? ?Past Medical History: ?Patient  has a past medical history of Allergy, Chronic migraine without aura, with intractable migraine, so stated, without mention of status migrainosus (12/08/2012), Depression, DJD (degenerative joint disease), Fibromyalgia, GERD (gastroesophageal reflux disease), Hepatitis C (1980), Hypersomnia with sleep apnea, unspecified, Insomnia, Lumbago (12/08/2012), Migraines, Myalgia and myositis, unspecified (12/08/2012), Overactive bladder, S/P colonoscopy (10/29/1999), S/P colonoscopy (09/12/2005), Sleep apnea, Tobacco dependence, Type II or unspecified type diabetes mellitus without mention of complication, not stated as uncontrolled, and Unspecified hereditary and idiopathic peripheral neuropathy.  ? ?Past Surgical History: ?She  has a past surgical history that includes complete hyst (2001); Sinus surgery with Instatrak; Hip surgery; Abdominal hysterectomy (10/30/1999); Pelvic laparoscopy; Foot surgery (Left); Colonoscopy; Exploratory laparotomy; Colonoscopy with propofol (N/A, 10/20/2017); Esophagogastroduodenoscopy (egd) with propofol (N/A, 10/20/2017); Savory dilation (N/A, 10/20/2017); and polypectomy (10/20/2017).  ? ?Medications: ?She has a current medication list which includes the following prescription(s): aimovig, albuterol, albuterol, amitriptyline, baclofen, breo ellipta, buprenorphine, buprenorphine, butalbital-acetaminophen-caffeine, calcium carb-cholecalciferol, vitamin d, diazepam, ergocalciferol, fluoxetine, fluticasone, gabapentin, methadone, ondansetron, pantoprazole, prednisone, solifenacin, sumatriptan, and tramadol.  ? ?Allergies: ?Patient is allergic to other.  ? ?Social  History: ?Patient  reports that she has been smoking cigarettes. She started smoking about 47 years ago. She has a 80.00 pack-year smoking history. She has never used smokeless tobacco. She reports current drug use. Drug: Marijuana. She reports that she does not drink alcohol.  ?  ?  ?OBJECTIVE  ?  ? ?Physical Exam: ?There were no vitals filed for this visit. ?Gen: No apparent distress, A&O x 3. ? ?Detailed Urogynecologic Evaluation:  ?Deferred. Prior exam showed: ? ?No flowsheet data found.    ? ?ASSESSMENT AND PLAN  ?  ?[name] is a 66 y.o. with:  ?No diagnosis found. ? ?

## 2021-04-15 ENCOUNTER — Other Ambulatory Visit: Payer: Self-pay | Admitting: Family Medicine

## 2021-04-18 DIAGNOSIS — F17210 Nicotine dependence, cigarettes, uncomplicated: Secondary | ICD-10-CM | POA: Diagnosis not present

## 2021-04-18 DIAGNOSIS — R110 Nausea: Secondary | ICD-10-CM | POA: Diagnosis not present

## 2021-04-18 DIAGNOSIS — K2210 Ulcer of esophagus without bleeding: Secondary | ICD-10-CM | POA: Diagnosis not present

## 2021-04-18 DIAGNOSIS — K219 Gastro-esophageal reflux disease without esophagitis: Secondary | ICD-10-CM | POA: Diagnosis not present

## 2021-04-18 DIAGNOSIS — Z299 Encounter for prophylactic measures, unspecified: Secondary | ICD-10-CM | POA: Diagnosis not present

## 2021-04-18 DIAGNOSIS — R1011 Right upper quadrant pain: Secondary | ICD-10-CM | POA: Diagnosis not present

## 2021-04-22 ENCOUNTER — Other Ambulatory Visit: Payer: Self-pay | Admitting: Student

## 2021-04-22 ENCOUNTER — Other Ambulatory Visit (HOSPITAL_COMMUNITY): Payer: Self-pay | Admitting: Student

## 2021-04-22 DIAGNOSIS — R1011 Right upper quadrant pain: Secondary | ICD-10-CM

## 2021-04-23 ENCOUNTER — Encounter: Payer: Self-pay | Admitting: Internal Medicine

## 2021-04-30 ENCOUNTER — Ambulatory Visit (HOSPITAL_COMMUNITY)
Admission: RE | Admit: 2021-04-30 | Discharge: 2021-04-30 | Disposition: A | Discharge status: Home / Self Care | Payer: Medicare Other | Source: Ambulatory Visit | Attending: Student | Admitting: Student

## 2021-04-30 DIAGNOSIS — R1011 Right upper quadrant pain: Secondary | ICD-10-CM | POA: Insufficient documentation

## 2021-04-30 DIAGNOSIS — K8020 Calculus of gallbladder without cholecystitis without obstruction: Secondary | ICD-10-CM | POA: Diagnosis not present

## 2021-05-07 DIAGNOSIS — G4733 Obstructive sleep apnea (adult) (pediatric): Secondary | ICD-10-CM | POA: Diagnosis not present

## 2021-05-07 DIAGNOSIS — M5459 Other low back pain: Secondary | ICD-10-CM | POA: Diagnosis not present

## 2021-05-07 DIAGNOSIS — Z79891 Long term (current) use of opiate analgesic: Secondary | ICD-10-CM | POA: Diagnosis not present

## 2021-05-07 DIAGNOSIS — M545 Low back pain, unspecified: Secondary | ICD-10-CM | POA: Diagnosis not present

## 2021-05-07 DIAGNOSIS — M542 Cervicalgia: Secondary | ICD-10-CM | POA: Diagnosis not present

## 2021-05-07 DIAGNOSIS — G43701 Chronic migraine without aura, not intractable, with status migrainosus: Secondary | ICD-10-CM | POA: Diagnosis not present

## 2021-05-07 DIAGNOSIS — M62830 Muscle spasm of back: Secondary | ICD-10-CM | POA: Diagnosis not present

## 2021-05-23 ENCOUNTER — Encounter: Payer: Self-pay | Admitting: Internal Medicine

## 2021-05-23 ENCOUNTER — Encounter: Payer: Self-pay | Admitting: *Deleted

## 2021-05-23 ENCOUNTER — Ambulatory Visit (INDEPENDENT_AMBULATORY_CARE_PROVIDER_SITE_OTHER): Payer: Medicare Other | Admitting: Internal Medicine

## 2021-05-23 ENCOUNTER — Telehealth: Payer: Self-pay | Admitting: *Deleted

## 2021-05-23 VITALS — BP 130/68 | HR 97 | Temp 97.3°F | Ht 68.0 in | Wt 141.4 lb

## 2021-05-23 DIAGNOSIS — R1319 Other dysphagia: Secondary | ICD-10-CM | POA: Diagnosis not present

## 2021-05-23 DIAGNOSIS — K8020 Calculus of gallbladder without cholecystitis without obstruction: Secondary | ICD-10-CM | POA: Diagnosis not present

## 2021-05-23 DIAGNOSIS — K219 Gastro-esophageal reflux disease without esophagitis: Secondary | ICD-10-CM

## 2021-05-23 DIAGNOSIS — R1011 Right upper quadrant pain: Secondary | ICD-10-CM

## 2021-05-23 DIAGNOSIS — D122 Benign neoplasm of ascending colon: Secondary | ICD-10-CM | POA: Diagnosis not present

## 2021-05-23 MED ORDER — ONDANSETRON HCL 4 MG PO TABS: 4.0000 mg | ORAL_TABLET | Freq: Three times a day (TID) | ORAL | 2 refills | Status: AC | PRN

## 2021-05-23 MED ORDER — PEG 3350-KCL-NA BICARB-NACL 420 G PO SOLR: ORAL | 0 refills | Status: DC

## 2021-05-23 MED ORDER — PANTOPRAZOLE SODIUM 40 MG PO TBEC: DELAYED_RELEASE_TABLET | ORAL | 11 refills | Status: DC

## 2021-05-23 NOTE — Progress Notes (Signed)
Primary Care Physician:  Monico Blitz, MD ?Primary Gastroenterologist:  Dr. Abbey Chatters ? ?Chief Complaint  ?Patient presents with  ? New Patient (Initial Visit)  ?  Gerd, nausea, bloating, gallstones  ? ? ?HPI:   ?Patricia Jones is a 66 y.o. female who presents to the clinic today by referral from her PCP Dr. Brigitte Pulse for evaluation.  Has not been seen in our clinic in over 3 years. ? ?States approximately 3 months ago she was in her normal state of health when she had a sudden attack of right upper quadrant pain and nausea and vomiting.  This lasted 24 to 48 hours.  She has had 2 subsequent attacks since that time. ? ?History of chronic GERD which was previously well controlled on pantoprazole daily.  Has not taken this medication in a long time.  States she stopped taking it as her symptoms improved.   ? ?Also history of chronic dysphagia.  EGD 2019 with esophageal stricture status post dilation.  States food will get stuck in her substernal region as well as pills. ? ?Last colonoscopy 2019 with numerous adenomatous colon polyps removed.  Recommended 3-year recall.  Due today. ? ?Right upper quadrant ultrasound 04/30/2021 with evidence of cholelithiasis without acute cholecystitis. ? ?Past Medical History:  ?Diagnosis Date  ? Allergy   ? Chronic migraine without aura, with intractable migraine, so stated, without mention of status migrainosus 12/08/2012  ? Depression   ? DJD (degenerative joint disease)   ? Fibromyalgia   ? GERD (gastroesophageal reflux disease)   ? Hepatitis C 1980  ? symptomatic w/ jaundice, NEGATIVE HCV RNA  08/2005, positive hepatitis C antibody but negative HCVRNA in 2019  ? Hypersomnia with sleep apnea, unspecified   ? Insomnia   ? Lumbago 12/08/2012  ? Migraines   ? Myalgia and myositis, unspecified 12/08/2012  ? Overactive bladder   ? S/P colonoscopy 10/29/1999  ? Dr Arita Miss polyps-hyperplastic  ? S/P colonoscopy 09/12/2005  ? 4 hyperplastic polyps  ? Sleep apnea   ? CPAP  ? Tobacco dependence   ?  Type II or unspecified type diabetes mellitus without mention of complication, not stated as uncontrolled   ? Unspecified hereditary and idiopathic peripheral neuropathy   ? ? ?Past Surgical History:  ?Procedure Laterality Date  ? ABDOMINAL HYSTERECTOMY  10/30/1999  ? TAH/BSO  ? COLONOSCOPY    ? 2007, 2001, hyperplastic polyps  ? COLONOSCOPY WITH PROPOFOL N/A 10/20/2017  ? Procedure: COLONOSCOPY WITH PROPOFOL;  Surgeon: Danie Binder, MD;  [facility];  Service: Endoscopy;  Laterality: N/A;  10:45am  ? complete hyst  2001  ? ESOPHAGOGASTRODUODENOSCOPY (EGD) WITH PROPOFOL N/A 10/20/2017  ? Procedure: ESOPHAGOGASTRODUODENOSCOPY (EGD) WITH PROPOFOL;  Surgeon: Danie Binder, MD;  [facility];  Service: Endoscopy;  Laterality: N/A;  ? EXPLORATORY LAPAROTOMY    ? Ruptured ovarian cyst with hemorrhage in her 59s  ? FOOT SURGERY Left   ? HIP SURGERY    ? laproscopic left  ? PELVIC LAPAROSCOPY    ? Multiple for ovarian cysts and endometriosis, infertility issues  ? POLYPECTOMY  10/20/2017  ? Procedure: POLYPECTOMY;  Surgeon: Danie Binder, MD;  [facility];  Service: Endoscopy;;  ? SAVORY DILATION N/A 10/20/2017  ? Procedure: SAVORY DILATION;  Surgeon: Danie Binder, MD;  [facility];  Service: Endoscopy;  Laterality: N/A;  ? SINUS SURGERY WITH INSTATRAK    ? ? ?Current Outpatient Medications  ?Medication Sig Dispense Refill  ?  AIMOVIG 140 MG/ML SOAJ SMARTSIG:1 Pre-Filled Pen Syringe SUB-Q Once a Month    ? albuterol (VENTOLIN HFA) 108 (90 Base) MCG/ACT inhaler INHALE 2 PUFFS INTO THE LUNGS EVERY 4 HOURS AS NEEDED FOR WHEEZING OR SHORTNESS OF BREATH. 8.5 g 0  ? baclofen (LIORESAL) 10 MG tablet 1 tablet with food or milk    ? BREO ELLIPTA 100-25 MCG/ACT AEPB INHALE 1 PUFF INTO LUNGS DAILY. 60 each 0  ? buprenorphine (SUBUTEX) 2 MG SUBL SL tablet buprenorphine HCl 2 mg sublingual tablet ? Place 1 tablet twice a day by sublingual route.    ? butalbital-acetaminophen-caffeine  (FIORICET, ESGIC) 50-325-40 MG per tablet Take 1-2 tablets by mouth every 4 (four) hours as needed for headache or migraine.    ? Calcium Carbonate-Vitamin D (SM CALCIUM 500/VITAMIN D3 PO) Take 1 tablet by mouth daily.    ? Cholecalciferol (VITAMIN D) 2000 UNITS tablet Take 2,000 Units by mouth daily.    ? diazepam (VALIUM) 10 MG tablet diazepam 10 mg tablet ? Take 1 tablet every day by oral route.    ? ergocalciferol (VITAMIN D2) 1.25 MG (50000 UT) capsule ergocalciferol (vitamin D2) 1,250 mcg (50,000 unit) capsule    ? FLUoxetine (PROZAC) 40 MG capsule 1 capsule in the morning    ? gabapentin (NEURONTIN) 600 MG tablet 1 capsule    ? traMADol (ULTRAM) 50 MG tablet 1 tablet as needed    ? albuterol (PROVENTIL) (2.5 MG/3ML) 0.083% nebulizer solution Take 3 mLs (2.5 mg total) by nebulization every 6 (six) hours as needed for wheezing or shortness of breath. (Patient not taking: Reported on 05/23/2021) 150 mL 1  ? amitriptyline (ELAVIL) 10 MG tablet Take 20 mg by mouth at bedtime.  (Patient not taking: Reported on 02/15/2021)    ? buprenorphine (SUBUTEX) 2 MG SUBL SL tablet Place 2-4 mg under the tongue daily as needed (for pain).  (Patient not taking: Reported on 05/23/2021)    ? fluticasone (FLONASE) 50 MCG/ACT nasal spray Place 2 sprays into both nostrils daily. (Patient not taking: Reported on 05/23/2021) 16 g 6  ? methadone (DOLOPHINE) 5 MG tablet 1-2 tablet (Patient not taking: Reported on 05/23/2021)    ? ondansetron (ZOFRAN) 8 MG tablet Take 1 tablet (8 mg total) by mouth every 8 (eight) hours as needed for nausea or vomiting. (Patient not taking: Reported on 05/23/2021) 20 tablet 0  ? pantoprazole (PROTONIX) 40 MG tablet 1 po 30 mins prior to first meal (Patient not taking: Reported on 05/23/2021) 30 tablet 11  ? predniSONE (STERAPRED UNI-PAK 48 TAB) 10 MG (48) TBPK tablet Take by mouth daily. (Patient not taking: Reported on 05/23/2021)    ? solifenacin (VESICARE) 5 MG tablet Take 1 tablet (5 mg total) by mouth daily.  (Patient not taking: Reported on 05/23/2021) 30 tablet 5  ? SUMAtriptan (IMITREX) 6 MG/0.5ML SOLN Inject 6 mg into the skin every 2 (two) hours as needed for migraine or headache. (Patient not taking: Reported on 05/23/2021)    ? ?No current facility-administered medications for this visit.  ? ? ?Allergies as of 05/23/2021 - Review Complete 05/23/2021  ?Allergen Reaction Noted  ? Other  11/26/2018  ? ? ?Family History  ?Problem Relation Age of Onset  ? Aneurysm Mother   ? Heart disease Mother   ? Coronary artery disease Father   ? Stroke Father   ? Heart disease Father   ? Cervical cancer Sister   ? Ovarian cancer Sister   ? Colon cancer Maternal Grandmother   ?  greater than age 64. was her great grandmother  ? Diabetes Maternal Aunt   ? ? ?Social History  ? ?Socioeconomic History  ? Marital status: Divorced  ?  Spouse name: Not on file  ? Number of children: 2  ? Years of education: Not on file  ? Highest education level: Not on file  ?Occupational History  ? Occupation: disabled   ?  Comment: previously RN @ [employer]  ?Tobacco Use  ? Smoking status: Every Day  ?  Packs/day: 2.00  ?  Years: 40.00  ?  Pack years: 80.00  ?  Types: Cigarettes  ?  Start date: 12/25/1973  ? Smokeless tobacco: Never  ?Vaping Use  ? Vaping Use: Never used  ?Substance and Sexual Activity  ? Alcohol use: No  ?  Comment: heavy etoh x 30 yrs, quit 14 yrs ago  ? Drug use: Yes  ?  Types: Marijuana  ?  Comment: last use 08/27/19  ? Sexual activity: Yes  ?  Partners: Male  ?  Comment: [facility]  ?Other Topics Concern  ? Not on file  ?Social History Narrative  ? Not on file  ? ?Social Determinants of Health  ? ?Financial Resource Strain: Not on file  ?Food Insecurity: Not on file  ?Transportation Needs: Not on file  ?Physical Activity: Not on file  ?Stress: Not on file  ?Social Connections: Not on file  ?Intimate Partner Violence: Not on file  ? ? ?Subjective: ?Review of Systems  ?Constitutional:  Negative for chills and fever.  ?HENT:  Negative for  congestion and hearing loss.   ?Eyes:  Negative for blurred vision and double vision.  ?Respiratory:  Negative for cough and shortness of breath.   ?Cardiovascular:  Negative for chest pain and palpitations.

## 2021-05-23 NOTE — Patient Instructions (Addendum)
We will schedule you for upper endoscopy to further evaluate your abdominal pain, nausea vomiting, chronic reflux. ? ?I may stretch your esophagus depending on findings. ? ?At the same time we will perform colonoscopy for surveillance purposes given your history of numerous polyps. ? ?I we will restart your pantoprazole and Zofran.  Sent to [facility]. ? ?If symptoms do not improve on medications, and endoscopic findings are unremarkable, we may need to consider referral to surgery to discuss gallbladder surgery. ? ?It was very nice meeting you today. ? ?Dr. Abbey Chatters ? ?Lifestyle and home remedies TO MANAGE REFLUX/HEARTBURN ? ?  ?You may eliminate or reduce the frequency of heartburn by making the following lifestyle changes: ?  ?Control your weight. Being overweight is a major risk factor for heartburn and GERD. Excess pounds put pressure on your abdomen, pushing up your stomach and causing acid to back up into your esophagus.  ?  ?Eat smaller meals. 4 TO 6 MEALS A DAY. This reduces pressure on the lower esophageal sphincter, helping to prevent the valve from opening and acid from washing back into your esophagus. ?  ?  ?Loosen your belt. Clothes that fit tightly around your waist put pressure on your abdomen and the lower esophageal sphincter. ?  ?  ?Eliminate heartburn triggers. Everyone has specific triggers. Common triggers such as fatty or fried foods, spicy food, tomato sauce, carbonated beverages, alcohol, chocolate, mint, garlic, onion, caffeine and nicotine may make heartburn worse.  ?  ?Avoid stooping or bending. Tying your shoes is OK. Bending over for longer periods to weed your garden isn't, especially soon after eating.  ?  ?Don't lie down after a meal. Wait at least three to four hours after eating before going to bed, and don't lie down right after eating.  ? ?At [facility] we value your feedback. You may receive a survey about your visit today. Please share your experience  as we strive to create trusting relationships with our patients to provide genuine, compassionate, quality care. ? ?We appreciate your understanding and patience as we review any laboratory studies, imaging, and other diagnostic tests that are ordered as we care for you. Our office policy is 5 business days for review of these results, and any emergent or urgent results are addressed in a timely manner for your best interest. If you do not hear from our office in 1 week, please contact us.  ? ?We also encourage the use of MyChart, which contains your medical information for your review as well. If you are not enrolled in this feature, an access code is on this after visit summary for your convenience. Thank you for allowing [place] to be involved in your care. ? ?It was great to see you today!  I hope you have a great rest of your Spring! ? ? ? ?Elon Alas. Abbey Chatters, D.O. ?Gastroenterology and Hepatology ?[facility]  ? ?

## 2021-05-23 NOTE — H&P (View-Only) (Signed)
Primary Care Physician:  Monico Blitz, MD Primary Gastroenterologist:  Dr. Abbey Chatters  Chief Complaint  Patient presents with   New Patient (Initial Visit)    Jerrye Bushy, nausea, bloating, gallstones    HPI:   Patricia Jones is a 66 y.o. female who presents to the clinic today by referral from her PCP Dr. Brigitte Pulse for evaluation.  Has not been seen in our clinic in over 3 years.  States approximately 3 months ago she was in her normal state of health when she had a sudden attack of right upper quadrant pain and nausea and vomiting.  This lasted 24 to 48 hours.  She has had 2 subsequent attacks since that time.  History of chronic GERD which was previously well controlled on pantoprazole daily.  Has not taken this medication in a long time.  States she stopped taking it as her symptoms improved.    Also history of chronic dysphagia.  EGD 2019 with esophageal stricture status post dilation.  States food will get stuck in her substernal region as well as pills.  Last colonoscopy 2019 with numerous adenomatous colon polyps removed.  Recommended 3-year recall.  Due today.  Right upper quadrant ultrasound 04/30/2021 with evidence of cholelithiasis without acute cholecystitis.  Past Medical History:  Diagnosis Date   Allergy    Chronic migraine without aura, with intractable migraine, so stated, without mention of status migrainosus 12/08/2012   Depression    DJD (degenerative joint disease)    Fibromyalgia    GERD (gastroesophageal reflux disease)    Hepatitis C 1980   symptomatic w/ jaundice, NEGATIVE HCV RNA  08/2005, positive hepatitis C antibody but negative HCVRNA in 2019   Hypersomnia with sleep apnea, unspecified    Insomnia    Lumbago 12/08/2012   Migraines    Myalgia and myositis, unspecified 12/08/2012   Overactive bladder    S/P colonoscopy 10/29/1999   Dr Arita Miss polyps-hyperplastic   S/P colonoscopy 09/12/2005   4 hyperplastic polyps   Sleep apnea    CPAP   Tobacco dependence     Type II or unspecified type diabetes mellitus without mention of complication, not stated as uncontrolled    Unspecified hereditary and idiopathic peripheral neuropathy     Past Surgical History:  Procedure Laterality Date   ABDOMINAL HYSTERECTOMY  10/30/1999   TAH/BSO   COLONOSCOPY     2007, 2001, hyperplastic polyps   COLONOSCOPY WITH PROPOFOL N/A 10/20/2017   Procedure: COLONOSCOPY WITH PROPOFOL;  Surgeon: Danie Binder, MD;  Location: [facility];  Service: Endoscopy;  Laterality: N/A;  10:45am   complete hyst  2001   ESOPHAGOGASTRODUODENOSCOPY (EGD) WITH PROPOFOL N/A 10/20/2017   Procedure: ESOPHAGOGASTRODUODENOSCOPY (EGD) WITH PROPOFOL;  Surgeon: Danie Binder, MD;  Location: [facility];  Service: Endoscopy;  Laterality: N/A;   EXPLORATORY LAPAROTOMY     Ruptured ovarian cyst with hemorrhage in her 87s   FOOT SURGERY Left    HIP SURGERY     laproscopic left   PELVIC LAPAROSCOPY     Multiple for ovarian cysts and endometriosis, infertility issues   POLYPECTOMY  10/20/2017   Procedure: POLYPECTOMY;  Surgeon: Danie Binder, MD;  Location: [facility];  Service: Endoscopy;;   SAVORY DILATION N/A 10/20/2017   Procedure: SAVORY DILATION;  Surgeon: Danie Binder, MD;  Location: [facility];  Service: Endoscopy;  Laterality: N/A;   SINUS SURGERY WITH INSTATRAK      Current Outpatient Medications  Medication Sig Dispense Refill  AIMOVIG 140 MG/ML SOAJ SMARTSIG:1 Pre-Filled Pen Syringe SUB-Q Once a Month     albuterol (VENTOLIN HFA) 108 (90 Base) MCG/ACT inhaler INHALE 2 PUFFS INTO THE LUNGS EVERY 4 HOURS AS NEEDED FOR WHEEZING OR SHORTNESS OF BREATH. 8.5 g 0   baclofen (LIORESAL) 10 MG tablet 1 tablet with food or milk     BREO ELLIPTA 100-25 MCG/ACT AEPB INHALE 1 PUFF INTO LUNGS DAILY. 60 each 0   buprenorphine (SUBUTEX) 2 MG SUBL SL tablet buprenorphine HCl 2 mg sublingual tablet  Place 1 tablet twice a day by sublingual route.     butalbital-acetaminophen-caffeine  (FIORICET, ESGIC) 50-325-40 MG per tablet Take 1-2 tablets by mouth every 4 (four) hours as needed for headache or migraine.     Calcium Carbonate-Vitamin D (SM CALCIUM 500/VITAMIN D3 PO) Take 1 tablet by mouth daily.     Cholecalciferol (VITAMIN D) 2000 UNITS tablet Take 2,000 Units by mouth daily.     diazepam (VALIUM) 10 MG tablet diazepam 10 mg tablet  Take 1 tablet every day by oral route.     ergocalciferol (VITAMIN D2) 1.25 MG (50000 UT) capsule ergocalciferol (vitamin D2) 1,250 mcg (50,000 unit) capsule     FLUoxetine (PROZAC) 40 MG capsule 1 capsule in the morning     gabapentin (NEURONTIN) 600 MG tablet 1 capsule     traMADol (ULTRAM) 50 MG tablet 1 tablet as needed     albuterol (PROVENTIL) (2.5 MG/3ML) 0.083% nebulizer solution Take 3 mLs (2.5 mg total) by nebulization every 6 (six) hours as needed for wheezing or shortness of breath. (Patient not taking: Reported on 05/23/2021) 150 mL 1   amitriptyline (ELAVIL) 10 MG tablet Take 20 mg by mouth at bedtime.  (Patient not taking: Reported on 02/15/2021)     buprenorphine (SUBUTEX) 2 MG SUBL SL tablet Place 2-4 mg under the tongue daily as needed (for pain).  (Patient not taking: Reported on 05/23/2021)     fluticasone (FLONASE) 50 MCG/ACT nasal spray Place 2 sprays into both nostrils daily. (Patient not taking: Reported on 05/23/2021) 16 g 6   methadone (DOLOPHINE) 5 MG tablet 1-2 tablet (Patient not taking: Reported on 05/23/2021)     ondansetron (ZOFRAN) 8 MG tablet Take 1 tablet (8 mg total) by mouth every 8 (eight) hours as needed for nausea or vomiting. (Patient not taking: Reported on 05/23/2021) 20 tablet 0   pantoprazole (PROTONIX) 40 MG tablet 1 po 30 mins prior to first meal (Patient not taking: Reported on 05/23/2021) 30 tablet 11   predniSONE (STERAPRED UNI-PAK 48 TAB) 10 MG (48) TBPK tablet Take by mouth daily. (Patient not taking: Reported on 05/23/2021)     solifenacin (VESICARE) 5 MG tablet Take 1 tablet (5 mg total) by mouth daily.  (Patient not taking: Reported on 05/23/2021) 30 tablet 5   SUMAtriptan (IMITREX) 6 MG/0.5ML SOLN Inject 6 mg into the skin every 2 (two) hours as needed for migraine or headache. (Patient not taking: Reported on 05/23/2021)     No current facility-administered medications for this visit.    Allergies as of 05/23/2021 - Review Complete 05/23/2021  Allergen Reaction Noted   Other  11/26/2018    Family History  Problem Relation Age of Onset   Aneurysm Mother    Heart disease Mother    Coronary artery disease Father    Stroke Father    Heart disease Father    Cervical cancer Sister    Ovarian cancer Sister    Colon cancer Maternal Grandmother  greater than age 55. was her great grandmother   Diabetes Maternal Aunt     Social History   Socioeconomic History   Marital status: Divorced    Spouse name: Not on file   Number of children: 2   Years of education: Not on file   Highest education level: Not on file  Occupational History   Occupation: disabled     Comment: previously Therapist, sports @ [employer]  Tobacco Use   Smoking status: Every Day    Packs/day: 2.00    Years: 40.00    Pack years: 80.00    Types: Cigarettes    Start date: 12/25/1973   Smokeless tobacco: Never  Vaping Use   Vaping Use: Never used  Substance and Sexual Activity   Alcohol use: No    Comment: heavy etoh x 30 yrs, quit 14 yrs ago   Drug use: Yes    Types: Marijuana    Comment: last use 08/27/19   Sexual activity: Yes    Partners: Male    Comment: [facility]  Other Topics Concern   Not on file  Social History Narrative   Not on file   Social Determinants of Health   Financial Resource Strain: Not on file  Food Insecurity: Not on file  Transportation Needs: Not on file  Physical Activity: Not on file  Stress: Not on file  Social Connections: Not on file  Intimate Partner Violence: Not on file    Subjective: Review of Systems  Constitutional:  Negative for chills and fever.  HENT:  Negative for  congestion and hearing loss.   Eyes:  Negative for blurred vision and double vision.  Respiratory:  Negative for cough and shortness of breath.   Cardiovascular:  Negative for chest pain and palpitations.  Gastrointestinal:  Positive for nausea and vomiting. Negative for abdominal pain, blood in stool, constipation, diarrhea, heartburn and melena.  Genitourinary:  Negative for dysuria and urgency.  Musculoskeletal:  Negative for joint pain and myalgias.  Skin:  Negative for itching and rash.  Neurological:  Negative for dizziness and headaches.  Psychiatric/Behavioral:  Negative for depression. The patient is not nervous/anxious.       Objective: BP 130/68   Pulse 97   Temp (!) 97.3 F (36.3 C)   Ht '5\' 8"'$  (1.727 m)   Wt 141 lb 6.4 oz (64.1 kg)   BMI 21.50 kg/m  Physical Exam Constitutional:      Appearance: Normal appearance.  HENT:     Head: Normocephalic and atraumatic.  Eyes:     Extraocular Movements: Extraocular movements intact.     Conjunctiva/sclera: Conjunctivae normal.  Cardiovascular:     Rate and Rhythm: Normal rate and regular rhythm.  Pulmonary:     Effort: Pulmonary effort is normal.     Breath sounds: Normal breath sounds.  Abdominal:     General: Bowel sounds are normal.     Palpations: Abdomen is soft.  Musculoskeletal:        General: No swelling. Normal range of motion.     Cervical back: Normal range of motion and neck supple.  Skin:    General: Skin is warm and dry.     Coloration: Skin is not jaundiced.  Neurological:     General: No focal deficit present.     Mental Status: She is alert and oriented to person, place, and time.  Psychiatric:        Mood and Affect: Mood normal.        Behavior:  Behavior normal.     Assessment: *Nausea and vomiting  *RUQ pain *Cholelithiasis *Esophageal dysphagia  *Adenomatous colon polyps  Plan: Will schedule for EGD with possible dilation to evaluate for peptic ulcer disease, esophagitis, gastritis,  H. Pylori, duodenitis, or other. Will also evaluate for esophageal stricture, Schatzki's ring, esophageal web or other.   At the same time we will perform colonoscopy for surveillance purposes given her history of numerous adenomatous colon polyps.  The risks including infection, bleed, or perforation as well as benefits, limitations, alternatives and imponderables have been reviewed with the patient. Potential for esophageal dilation, biopsy, etc. have also been reviewed.  Questions have been answered. All parties agreeable.  We will restart patient on pantoprazole 40 mg daily.  I have refilled her Zofran to take as needed for nausea.  If above work-up is unremarkable and she does not improve as expected on PPI therapy, may need to consider referral to general surgery to discuss cholecystectomy.  Thank you Dr. Manuella Ghazi for the kind referral.  05/23/2021 2:10 PM   Disclaimer: This note was dictated with voice recognition software. Similar sounding words can inadvertently be transcribed and may not be corrected upon review.

## 2021-05-23 NOTE — Telephone Encounter (Signed)
PA done via [facility] Auth# [id number], DOS: Jun 17, 2021 - Sep 15, 2021 ?

## 2021-05-24 ENCOUNTER — Encounter: Payer: Self-pay | Admitting: *Deleted

## 2021-06-12 NOTE — Patient Instructions (Signed)
TANZIE ROTHSCHILD  06/12/2021     '@PREFPERIOPPHARMACY'$ @   Your procedure is scheduled on  06/17/2021.   Report to [facility] at  [place].   Call this number if you have problems the morning of surgery:  [phone]   Remember:  Follow the diet and prep instructions given to you by the office.    Use your nebulizer and your inhaler before you come and bring your rescue inhaler with you.     Take these medicines the morning of surgery with A SIP OF WATER             Baclofen, subutex, gabapentin, zofran(if needed), protonix, ultram(if needed).     Do not wear jewelry, make-up or nail polish.  Do not wear lotions, powders, or perfumes, or deodorant.  Do not shave 48 hours prior to surgery.  Men may shave face and neck.  Do not bring valuables to the hospital.  [facility] is not responsible for any belongings or valuables.  Contacts, dentures or bridgework may not be worn into surgery.  Leave your suitcase in the car.  After surgery it may be brought to your room.  For patients admitted to the hospital, discharge time will be determined by your treatment team.  Patients discharged the day of surgery will not be allowed to drive home and must have someone with them for 24 hours.    Special instructions:   DO NOT smoke tobacco or vape for 24 hours before your procedure.  Please read over the following fact sheets that you were given. Anesthesia Post-op Instructions and Care and Recovery After Surgery      Upper Endoscopy, Adult, Care After This sheet gives you information about how to care for yourself after your procedure. Your health care provider may also give you more specific instructions. If you have problems or questions, contact your health care provider. What can I expect after the procedure? After the procedure, it is common to have: A sore throat. Mild stomach pain or discomfort. Bloating. Nausea. Follow these instructions at home:  Follow  instructions from your health care provider about what to eat or drink after your procedure. Return to your normal activities as told by your health care provider. Ask your health care provider what activities are safe for you. Take over-the-counter and prescription medicines only as told by your health care provider. If you were given a sedative during the procedure, it can affect you for several hours. Do not drive or operate machinery until your health care provider says that it is safe. Keep all follow-up visits as told by your health care provider. This is important. Contact a health care provider if you have: A sore throat that lasts longer than one day. Trouble swallowing. Get help right away if: You vomit blood or your vomit looks like coffee grounds. You have: A fever. Bloody, black, or tarry stools. A severe sore throat or you cannot swallow. Difficulty breathing. Severe pain in your chest or abdomen. Summary After the procedure, it is common to have a sore throat, mild stomach discomfort, bloating, and nausea. If you were given a sedative during the procedure, it can affect you for several hours. Do not drive or operate machinery until your health care provider says that it is safe. Follow instructions from your health care provider about what to eat or drink after your procedure. Return to your normal activities as told by  your health care provider. This information is not intended to replace advice given to you by your health care provider. Make sure you discuss any questions you have with your health care provider. Document Revised: 11/05/2018 Document Reviewed: 06/01/2017 Elsevier Patient Education  [place]. Esophageal Dilatation Esophageal dilatation, also called esophageal dilation, is a procedure to widen or open a blocked or narrowed part of the esophagus. The esophagus is the part of the body that moves food and liquid from the mouth to the stomach. You may  need this procedure if: You have a buildup of scar tissue in your esophagus that makes it difficult, painful, or impossible to swallow. This can be caused by gastroesophageal reflux disease (GERD). You have cancer of the esophagus. There is a problem with how food moves through your esophagus. In some cases, you may need this procedure repeated at a later time to dilate the esophagus gradually. Tell a health care provider about: Any allergies you have. All medicines you are taking, including vitamins, herbs, eye drops, creams, and over-the-counter medicines. Any problems you or family members have had with anesthetic medicines. Any blood disorders you have. Any surgeries you have had. Any medical conditions you have. Any antibiotic medicines you are required to take before dental procedures. Whether you are pregnant or may be pregnant. What are the risks? Generally, this is a safe procedure. However, problems may occur, including: Bleeding due to a tear in the lining of the esophagus. A hole, or perforation, in the esophagus. What happens before the procedure? Ask your health care provider about: Changing or stopping your regular medicines. This is especially important if you are taking diabetes medicines or blood thinners. Taking medicines such as aspirin and ibuprofen. These medicines can thin your blood. Do not take these medicines unless your health care provider tells you to take them. Taking over-the-counter medicines, vitamins, herbs, and supplements. Follow instructions from your health care provider about eating or drinking restrictions. Plan to have a responsible adult take you home from the hospital or clinic. Plan to have a responsible adult care for you for the time you are told after you leave the hospital or clinic. This is important. What happens during the procedure? You may be given a medicine to help you relax (sedative). A numbing medicine may be sprayed into the back  of your throat, or you may gargle the medicine. Your health care provider may perform the dilatation using various surgical instruments, such as: Simple dilators. This instrument is carefully placed in the esophagus to stretch it. Guided wire bougies. This involves using an endoscope to insert a wire into the esophagus. A dilator is passed over this wire to enlarge the esophagus. Then the wire is removed. Balloon dilators. An endoscope with a small balloon is inserted into the esophagus. The balloon is inflated to stretch the esophagus and open it up. The procedure may vary among health care providers and hospitals. What can I expect after the procedure? Your blood pressure, heart rate, breathing rate, and blood oxygen level will be monitored until you leave the hospital or clinic. Your throat may feel slightly sore and numb. This will get better over time. You will not be allowed to eat or drink until your throat is no longer numb. When you are able to drink, urinate, and sit on the edge of the bed without nausea or dizziness, you may be able to return home. Follow these instructions at home: Take over-the-counter and prescription medicines only as  told by your health care provider. If you were given a sedative during the procedure, it can affect you for several hours. Do not drive or operate machinery until your health care provider says that it is safe. Plan to have a responsible adult care for you for the time you are told. This is important. Follow instructions from your health care provider about any eating or drinking restrictions. Do not use any products that contain nicotine or tobacco, such as cigarettes, e-cigarettes, and chewing tobacco. If you need help quitting, ask your health care provider. Keep all follow-up visits. This is important. Contact a health care provider if: You have a fever. You have pain that is not relieved by medicine. Get help right away if: You have chest  pain. You have trouble breathing. You have trouble swallowing. You vomit blood. You have black, tarry, or bloody stools. These symptoms may represent a serious problem that is an emergency. Do not wait to see if the symptoms will go away. Get medical help right away. Call your local emergency services (911 in the [place] Do not drive yourself to the hospital. Summary Esophageal dilatation, also called esophageal dilation, is a procedure to widen or open a blocked or narrowed part of the esophagus. Plan to have a responsible adult take you home from the hospital or clinic. For this procedure, a numbing medicine may be sprayed into the back of your throat, or you may gargle the medicine. Do not drive or operate machinery until your health care provider says that it is safe. This information is not intended to replace advice given to you by your health care provider. Make sure you discuss any questions you have with your health care provider. Document Revised: 05/18/2019 Document Reviewed: 05/18/2019 Elsevier Patient Education  [place]. Colonoscopy, Adult, Care After The following information offers guidance on how to care for yourself after your procedure. Your health care provider may also give you more specific instructions. If you have problems or questions, contact your health care provider. What can I expect after the procedure? After the procedure, it is common to have: A small amount of blood in your stool for 24 hours after the procedure. Some gas. Mild cramping or bloating of your abdomen. Follow these instructions at home: Eating and drinking  Drink enough fluid to keep your urine pale yellow. Follow instructions from your health care provider about eating or drinking restrictions. Resume your normal diet as told by your health care provider. Avoid heavy or fried foods that are hard to digest. Activity Rest as told by your health care provider. Avoid sitting for a long  time without moving. Get up to take short walks every 1-2 hours. This is important to improve blood flow and breathing. Ask for help if you feel weak or unsteady. Return to your normal activities as told by your health care provider. Ask your health care provider what activities are safe for you. Managing cramping and bloating  Try walking around when you have cramps or feel bloated. If directed, apply heat to your abdomen as told by your health care provider. Use the heat source that your health care provider recommends, such as a moist heat pack or a heating pad. Place a towel between your skin and the heat source. Leave the heat on for 20-30 minutes. Remove the heat if your skin turns bright red. This is especially important if you are unable to feel pain, heat, or cold. You have a greater risk of  getting burned. General instructions If you were given a sedative during the procedure, it can affect you for several hours. Do not drive or operate machinery until your health care provider says that it is safe. For the first 24 hours after the procedure: Do not sign important documents. Do not drink alcohol. Do your regular daily activities at a slower pace than normal. Eat soft foods that are easy to digest. Take over-the-counter and prescription medicines only as told by your health care provider. Keep all follow-up visits. This is important. Contact a health care provider if: You have blood in your stool 2-3 days after the procedure. Get help right away if: You have more than a small spotting of blood in your stool. You have large blood clots in your stool. You have swelling of your abdomen. You have nausea or vomiting. You have a fever. You have increasing pain in your abdomen that is not relieved with medicine. These symptoms may be an emergency. Get help right away. Call 911. Do not wait to see if the symptoms will go away. Do not drive yourself to the hospital. Summary After the  procedure, it is common to have a small amount of blood in your stool. You may also have mild cramping and bloating of your abdomen. If you were given a sedative during the procedure, it can affect you for several hours. Do not drive or operate machinery until your health care provider says that it is safe. Get help right away if you have a lot of blood in your stool, nausea or vomiting, a fever, or increased pain in your abdomen. This information is not intended to replace advice given to you by your health care provider. Make sure you discuss any questions you have with your health care provider. Document Revised: 08/22/2020 Document Reviewed: 08/22/2020 Elsevier Patient Education  [place] After This sheet gives you information about how to care for yourself after your procedure. Your health care provider may also give you more specific instructions. If you have problems or questions, contact your health care provider. What can I expect after the procedure? After the procedure, it is common to have: Tiredness. Forgetfulness about what happened after the procedure. Impaired judgment for important decisions. Nausea or vomiting. Some difficulty with balance. Follow these instructions at home: For the time period you were told by your health care provider:     Rest as needed. Do not participate in activities where you could fall or become injured. Do not drive or use machinery. Do not drink alcohol. Do not take sleeping pills or medicines that cause drowsiness. Do not make important decisions or sign legal documents. Do not take care of children on your own. Eating and drinking Follow the diet that is recommended by your health care provider. Drink enough fluid to keep your urine pale yellow. If you vomit: Drink water, juice, or soup when you can drink without vomiting. Make sure you have little or no nausea before eating solid foods. General  instructions Have a responsible adult stay with you for the time you are told. It is important to have someone help care for you until you are awake and alert. Take over-the-counter and prescription medicines only as told by your health care provider. If you have sleep apnea, surgery and certain medicines can increase your risk for breathing problems. Follow instructions from your health care provider about wearing your sleep device: Anytime you are sleeping, including during daytime naps.  While taking prescription pain medicines, sleeping medicines, or medicines that make you drowsy. Avoid smoking. Keep all follow-up visits as told by your health care provider. This is important. Contact a health care provider if: You keep feeling nauseous or you keep vomiting. You feel light-headed. You are still sleepy or having trouble with balance after 24 hours. You develop a rash. You have a fever. You have redness or swelling around the IV site. Get help right away if: You have trouble breathing. You have new-onset confusion at home. Summary For several hours after your procedure, you may feel tired. You may also be forgetful and have poor judgment. Have a responsible adult stay with you for the time you are told. It is important to have someone help care for you until you are awake and alert. Rest as told. Do not drive or operate machinery. Do not drink alcohol or take sleeping pills. Get help right away if you have trouble breathing, or if you suddenly become confused. This information is not intended to replace advice given to you by your health care provider. Make sure you discuss any questions you have with your health care provider. Document Revised: 12/04/2020 Document Reviewed: 12/02/2018 Elsevier Patient Education  [place].

## 2021-06-13 ENCOUNTER — Encounter (HOSPITAL_COMMUNITY)
Admission: RE | Admit: 2021-06-13 | Discharge: 2021-06-13 | Disposition: A | Discharge status: Home / Self Care | Payer: Medicare Other | Source: Ambulatory Visit | Attending: Internal Medicine | Admitting: Internal Medicine

## 2021-06-13 ENCOUNTER — Encounter (HOSPITAL_COMMUNITY): Payer: Self-pay

## 2021-06-13 VITALS — BP 109/58 | HR 80 | Temp 97.3°F | Resp 18 | Ht 68.0 in | Wt 145.0 lb

## 2021-06-13 DIAGNOSIS — Z01818 Encounter for other preprocedural examination: Secondary | ICD-10-CM | POA: Insufficient documentation

## 2021-06-13 DIAGNOSIS — F17200 Nicotine dependence, unspecified, uncomplicated: Secondary | ICD-10-CM | POA: Insufficient documentation

## 2021-06-13 DIAGNOSIS — E119 Type 2 diabetes mellitus without complications: Secondary | ICD-10-CM | POA: Diagnosis not present

## 2021-06-13 LAB — BASIC METABOLIC PANEL
Anion gap: 6 (ref 5–15)
BUN: 15 mg/dL (ref 8–23)
CO2: 31 mmol/L (ref 22–32)
Calcium: 9.2 mg/dL (ref 8.9–10.3)
Chloride: 100 mmol/L (ref 98–111)
Creatinine, Ser: 0.62 mg/dL (ref 0.44–1.00)
GFR, Estimated: >60 mL/min (ref >60)
Glucose, Bld: 87 mg/dL (ref 70–99)
Potassium: 3.9 mmol/L (ref 3.5–5.1)
Sodium: 137 mmol/L (ref 135–145)

## 2021-06-17 ENCOUNTER — Ambulatory Visit (HOSPITAL_COMMUNITY): Payer: Medicare Other | Admitting: Anesthesiology

## 2021-06-17 ENCOUNTER — Encounter (HOSPITAL_COMMUNITY): Payer: Self-pay | Admitting: *Deleted

## 2021-06-17 ENCOUNTER — Ambulatory Visit (HOSPITAL_BASED_OUTPATIENT_CLINIC_OR_DEPARTMENT_OTHER): Payer: Medicare Other | Admitting: Anesthesiology

## 2021-06-17 ENCOUNTER — Encounter (HOSPITAL_COMMUNITY)
Admission: RE | Disposition: A | Discharge status: Home / Self Care | Payer: Self-pay | Source: Home / Self Care | Attending: Internal Medicine

## 2021-06-17 ENCOUNTER — Ambulatory Visit (HOSPITAL_COMMUNITY)
Admission: RE | Admit: 2021-06-17 | Discharge: 2021-06-17 | Disposition: A | Discharge status: Home / Self Care | Payer: Medicare Other | Attending: Internal Medicine | Admitting: Internal Medicine

## 2021-06-17 DIAGNOSIS — F17200 Nicotine dependence, unspecified, uncomplicated: Secondary | ICD-10-CM | POA: Insufficient documentation

## 2021-06-17 DIAGNOSIS — K219 Gastro-esophageal reflux disease without esophagitis: Secondary | ICD-10-CM | POA: Diagnosis not present

## 2021-06-17 DIAGNOSIS — J449 Chronic obstructive pulmonary disease, unspecified: Secondary | ICD-10-CM | POA: Diagnosis not present

## 2021-06-17 DIAGNOSIS — K2970 Gastritis, unspecified, without bleeding: Secondary | ICD-10-CM

## 2021-06-17 DIAGNOSIS — Z09 Encounter for follow-up examination after completed treatment for conditions other than malignant neoplasm: Secondary | ICD-10-CM | POA: Insufficient documentation

## 2021-06-17 DIAGNOSIS — E119 Type 2 diabetes mellitus without complications: Secondary | ICD-10-CM | POA: Diagnosis not present

## 2021-06-17 DIAGNOSIS — D123 Benign neoplasm of transverse colon: Secondary | ICD-10-CM | POA: Insufficient documentation

## 2021-06-17 DIAGNOSIS — Z86010 Personal history of colonic polyps: Secondary | ICD-10-CM | POA: Insufficient documentation

## 2021-06-17 DIAGNOSIS — R1011 Right upper quadrant pain: Secondary | ICD-10-CM | POA: Diagnosis not present

## 2021-06-17 DIAGNOSIS — D120 Benign neoplasm of cecum: Secondary | ICD-10-CM | POA: Insufficient documentation

## 2021-06-17 DIAGNOSIS — K648 Other hemorrhoids: Secondary | ICD-10-CM | POA: Diagnosis not present

## 2021-06-17 DIAGNOSIS — K635 Polyp of colon: Secondary | ICD-10-CM | POA: Diagnosis not present

## 2021-06-17 DIAGNOSIS — R1310 Dysphagia, unspecified: Secondary | ICD-10-CM | POA: Insufficient documentation

## 2021-06-17 DIAGNOSIS — F32A Depression, unspecified: Secondary | ICD-10-CM | POA: Diagnosis not present

## 2021-06-17 DIAGNOSIS — G4730 Sleep apnea, unspecified: Secondary | ICD-10-CM | POA: Insufficient documentation

## 2021-06-17 DIAGNOSIS — Z1211 Encounter for screening for malignant neoplasm of colon: Secondary | ICD-10-CM | POA: Diagnosis not present

## 2021-06-17 HISTORY — PX: POLYPECTOMY: SHX149

## 2021-06-17 HISTORY — PX: BALLOON DILATION: SHX5330

## 2021-06-17 HISTORY — PX: BIOPSY: SHX5522

## 2021-06-17 HISTORY — PX: COLONOSCOPY WITH PROPOFOL: SHX5780

## 2021-06-17 HISTORY — PX: ESOPHAGOGASTRODUODENOSCOPY (EGD) WITH PROPOFOL: SHX5813

## 2021-06-17 SURGERY — COLONOSCOPY WITH PROPOFOL
Anesthesia: General

## 2021-06-17 MED ORDER — PROPOFOL 10 MG/ML IV BOLUS
INTRAVENOUS | Status: DC | PRN
  Administered 2021-06-17 (×2): 20 mg via INTRAVENOUS
  Administered 2021-06-17: 100 mg via INTRAVENOUS

## 2021-06-17 MED ORDER — PANTOPRAZOLE SODIUM 40 MG PO TBEC: 40.0000 mg | DELAYED_RELEASE_TABLET | Freq: Two times a day (BID) | ORAL | 5 refills | Status: DC

## 2021-06-17 MED ORDER — LACTATED RINGERS IV SOLN: INTRAVENOUS | Status: DC | PRN

## 2021-06-17 MED ORDER — LACTATED RINGERS IV SOLN: INTRAVENOUS | Status: DC

## 2021-06-17 MED ORDER — LIDOCAINE HCL (CARDIAC) PF 100 MG/5ML IV SOSY
PREFILLED_SYRINGE | INTRAVENOUS | Status: DC | PRN
  Administered 2021-06-17: 60 mg via INTRATRACHEAL

## 2021-06-17 MED ORDER — PROPOFOL 500 MG/50ML IV EMUL
INTRAVENOUS | Status: DC | PRN
  Administered 2021-06-17: 100 ug/kg/min via INTRAVENOUS

## 2021-06-17 NOTE — Discharge Instructions (Addendum)
EGD Discharge instructions Please read the instructions outlined below and refer to this sheet in the next few weeks. These discharge instructions provide you with general information on caring for yourself after you leave the hospital. Your doctor may also give you specific instructions. While your treatment has been planned according to the most current medical practices available, unavoidable complications occasionally occur. If you have any problems or questions after discharge, please call your doctor. ACTIVITY You may resume your regular activity but move at a slower pace for the next 24 hours.  Take frequent rest periods for the next 24 hours.  Walking will help expel (get rid of) the air and reduce the bloated feeling in your abdomen.  No driving for 24 hours (because of the anesthesia (medicine) used during the test).  You may shower.  Do not sign any important legal documents or operate any machinery for 24 hours (because of the anesthesia used during the test).  NUTRITION Drink plenty of fluids.  You may resume your normal diet.  Begin with a light meal and progress to your normal diet.  Avoid alcoholic beverages for 24 hours or as instructed by your caregiver.  MEDICATIONS You may resume your normal medications unless your caregiver tells you otherwise.  WHAT YOU CAN EXPECT TODAY You may experience abdominal discomfort such as a feeling of fullness or "gas" pains.  FOLLOW-UP Your doctor will discuss the results of your test with you.  SEEK IMMEDIATE MEDICAL ATTENTION IF ANY OF THE FOLLOWING OCCUR: Excessive nausea (feeling sick to your stomach) and/or vomiting.  Severe abdominal pain and distention (swelling).  Trouble swallowing.  Temperature over 101 F (37.8 C).  Rectal bleeding or vomiting of blood.   Your EGD revealed mild amount inflammation in your stomach.  I took biopsies of this to rule out infection with a bacteria called H. pylori.  Await pathology results, my  office will contact you. You did have a slight tightening of your esophagus so I stretched this with a balloon today. Hopefully this helps with you swallowing. I am going to increase your pantoprazole to 40 mg twice daily for the next 12 weeks.   Your colonoscopy revealed 4 polyp(s) which I removed successfully. Await pathology results, my office will contact you. I recommend repeating colonoscopy in 5 years for surveillance purposes.   Follow up with GI in 6 months.    I hope you have a great rest of your week!  Elon Alas. Abbey Chatters, D.O. Gastroenterology and Hepatology [facility]

## 2021-06-17 NOTE — Transfer of Care (Signed)
Immediate Anesthesia Transfer of Care Note  Patient: Patricia Jones  Procedure(s) Performed: COLONOSCOPY WITH PROPOFOL ESOPHAGOGASTRODUODENOSCOPY (EGD) WITH PROPOFOL BALLOON DILATION BIOPSY POLYPECTOMY INTESTINAL  Patient Location: Short Stay  Anesthesia Type:General  Level of Consciousness: sedated  Airway & Oxygen Therapy: Patient Spontanous Breathing  Post-op Assessment: Report given to RN and Post -op Vital signs reviewed and stable  Post vital signs: Reviewed and stable  Last Vitals:  Vitals Value Taken Time  BP    Temp    Pulse    Resp    SpO2      Last Pain:  Vitals:   06/17/21 0952  TempSrc:   PainSc: 0-No pain      Patients Stated Pain Goal: 7 (38/25/05 3976)  Complications: No notable events documented.

## 2021-06-17 NOTE — Op Note (Signed)
[facility] Patient Name: Patricia Jones Procedure Date: 06/17/2021 9:50 AM MRN: [id number] Date of Birth: 01/18/1955 Attending MD: Elon Alas. Abbey Chatters DO CSN: [id number] Age: 66 Admit Type: Outpatient Procedure:                Colonoscopy Indications:              Surveillance: Personal history of adenomatous                            polyps on last colonoscopy 3 years ago Providers:                Elon Alas. Abbey Chatters, DO, Caprice Kluver, Crystal Page,                            Raphael Gibney, Technician Referring MD:              Medicines:                See the Anesthesia note for documentation of the                            administered medications Complications:            No immediate complications. Estimated Blood Loss:     Estimated blood loss was minimal. Procedure:                Pre-Anesthesia Assessment:                           - The anesthesia plan was to use monitored                            anesthesia care (MAC).                           After obtaining informed consent, the colonoscope                            was passed under direct vision. Throughout the                            procedure, the patient's blood pressure, pulse, and                            oxygen saturations were monitored continuously. The                            [id number]-[id number] ([id number]) was introduced through the                            anus and advanced to the the cecum, identified by                            appendiceal orifice and ileocecal valve. The                            colonoscopy was performed without difficulty.  The                            patient tolerated the procedure well. The quality                            of the bowel preparation was evaluated using the                            BBPS [facility] Bowel Preparation Scale) with scores                            of: Right Colon = 3, Transverse Colon = 3 and Left                            Colon = 3 (entire  mucosa seen well with no residual                            staining, small fragments of stool or opaque                            liquid). The total BBPS score equals 9. Scope In: 10:07:33 AM Scope Out: 10:27:09 AM Scope Withdrawal Time: 0 hours 15 minutes 0 seconds  Total Procedure Duration: 0 hours 19 minutes 36 seconds  Findings:      The perianal and digital rectal examinations were normal.      Non-bleeding internal hemorrhoids were found during endoscopy.      A 6 mm polyp was found in the cecum. The polyp was sessile. The polyp       was removed with a cold snare. Resection and retrieval were complete.      A 2 mm polyp was found in the cecum. The polyp was sessile. The polyp       was removed with a cold biopsy forceps. Resection and retrieval were       complete.      Two sessile polyps were found in the transverse colon. The polyps were 4       to 6 mm in size. These polyps were removed with a cold snare. Resection       and retrieval were complete.      The exam was otherwise without abnormality. Impression:               - Non-bleeding internal hemorrhoids.                           - One 6 mm polyp in the cecum, removed with a cold                            snare. Resected and retrieved.                           - One 2 mm polyp in the cecum, removed with a cold                            biopsy forceps. Resected and retrieved.                           -  Two 4 to 6 mm polyps in the transverse colon,                            removed with a cold snare. Resected and retrieved.                           - The examination was otherwise normal. Moderate Sedation:      Per Anesthesia Care Recommendation:           - Patient has a contact number available for                            emergencies. The signs and symptoms of potential                            delayed complications were discussed with the                            patient. Return to normal activities  tomorrow.                            Written discharge instructions were provided to the                            patient.                           - Continue present medications.                           - Resume previous diet.                           - Await pathology results.                           - Repeat colonoscopy in 5 years for surveillance.                           - Return to GI clinic in 4 months. Procedure Code(s):        --- Professional ---                           [phone], Colonoscopy, flexible; with removal of                            tumor(s), polyp(s), or other lesion(s) by snare                            technique                           [id number], 59, Colonoscopy, flexible; with biopsy,                            single or multiple Diagnosis Code(s):        ---  Professional ---                           K63.5, Polyp of colon                           Z86.010, Personal history of colonic polyps                           K64.8, Other hemorrhoids CPT copyright 2019 American Medical Association. All rights reserved. The codes documented in this report are preliminary and upon coder review may  be revised to meet current compliance requirements. Elon Alas. Abbey Chatters, DO [place] Yomayra Tate, DO 06/17/2021 10:30:17 AM This report has been signed electronically. Number of Addenda: 0

## 2021-06-17 NOTE — Op Note (Signed)
[facility] Patient Name: Patricia Jones Procedure Date: 06/17/2021 9:51 AM MRN: [id number] Date of Birth: Jul 10, 1955 Attending MD: Elon Alas. Abbey Chatters DO CSN: [id number] Age: 66 Admit Type: Outpatient Procedure:                Upper GI endoscopy Indications:              Abdominal pain in the right upper quadrant,                            Dysphagia, Nausea with vomiting Providers:                Elon Alas. Abbey Chatters, DO, Crystal Page, Raphael Gibney,                            Technician Referring MD:              Medicines:                See the Anesthesia note for documentation of the                            administered medications Complications:            No immediate complications. Estimated Blood Loss:     Estimated blood loss was minimal. Procedure:                Pre-Anesthesia Assessment:                           - The anesthesia plan was to use monitored                            anesthesia care (MAC).                           After obtaining informed consent, the endoscope was                            passed under direct vision. Throughout the                            procedure, the patient's blood pressure, pulse, and                            oxygen saturations were monitored continuously. The                            GIF-H190 ([id number]) scope was introduced through the                            mouth, and advanced to the second part of duodenum.                            The upper GI endoscopy was accomplished without                            difficulty. The patient tolerated  the procedure                            well. Scope In: 9:57:25 AM Scope Out: 10:02:54 AM Total Procedure Duration: 0 hours 5 minutes 29 seconds  Findings:      There is no endoscopic evidence of bleeding, areas of erosion,       esophagitis, ulcerations or varices in the entire esophagus.      No endoscopic abnormality was evident in the esophagus to explain the        patient's complaint of dysphagia. Preparations were made for empiric       dilation. A TTS dilator was passed through the scope. Dilation with an       18-19-20 mm balloon dilator was performed to 20 mm. Dilation was       performed with a mild resistance at 20 mm. Estimated blood loss was none.      Diffuse moderate inflammation characterized by erosions and erythema was       found in the entire examined stomach. Biopsies were taken with a cold       forceps for Helicobacter pylori testing.      The duodenal bulb, first portion of the duodenum and second portion of       the duodenum were normal. Impression:               - Gastritis. Biopsied.                           - Normal duodenal bulb, first portion of the                            duodenum and second portion of the duodenum. Moderate Sedation:      Per Anesthesia Care Recommendation:           - Patient has a contact number available for                            emergencies. The signs and symptoms of potential                            delayed complications were discussed with the                            patient. Return to normal activities tomorrow.                            Written discharge instructions were provided to the                            patient.                           - Resume previous diet.                           - Continue present medications.                           - Await pathology  results.                           - Repeat upper endoscopy PRN for retreatment.                           - Return to GI clinic in 6 months.                           - Use Protonix (pantoprazole) 40 mg PO BID. Procedure Code(s):        --- Professional ---                           [phone], Esophagogastroduodenoscopy, flexible,                            transoral; with biopsy, single or multiple Diagnosis Code(s):        --- Professional ---                           K29.70, Gastritis, unspecified, without  bleeding                           R10.11, Right upper quadrant pain                           R13.10, Dysphagia, unspecified                           R11.2, Nausea with vomiting, unspecified CPT copyright 2019 American Medical Association. All rights reserved. The codes documented in this report are preliminary and upon coder review may  be revised to meet current compliance requirements. Elon Alas. Abbey Chatters, DO [place] Abbey Chatters, DO 06/17/2021 10:05:49 AM This report has been signed electronically. Number of Addenda: 0

## 2021-06-17 NOTE — Interval H&P Note (Signed)
History and Physical Interval Note:  06/17/2021 9:46 AM  Patricia Jones  has presented today for surgery, with the diagnosis of hx polyps, gerd, dysphagia, RUQ pain.  The various methods of treatment have been discussed with the patient and family. After consideration of risks, benefits and other options for treatment, the patient has consented to  Procedure(s) with comments: COLONOSCOPY WITH PROPOFOL (N/A) - 10:30am ESOPHAGOGASTRODUODENOSCOPY (EGD) WITH PROPOFOL (N/A) BALLOON DILATION (N/A) as a surgical intervention.  The patient's history has been reviewed, patient examined, no change in status, stable for surgery.  I have reviewed the patient's chart and labs.  Questions were answered to the patient's satisfaction.     [name]

## 2021-06-17 NOTE — Anesthesia Preprocedure Evaluation (Signed)
Anesthesia Evaluation  Patient identified by MRN, date of birth, ID band Patient awake    Reviewed: Allergy & Precautions, H&P , NPO status , Patient's Chart, lab work & pertinent test results, reviewed documented beta blocker date and time   Airway Mallampati: II  TM Distance: >3 FB Neck ROM: full    Dental no notable dental hx. (+) Teeth Intact   Pulmonary sleep apnea , COPD, Current Smoker,    Pulmonary exam normal breath sounds clear to auscultation       Cardiovascular Exercise Tolerance: Good negative cardio ROS   Rhythm:regular Rate:Normal     Neuro/Psych  Headaches, PSYCHIATRIC DISORDERS Depression  Neuromuscular disease    GI/Hepatic PUD, GERD  Medicated,(+) Hepatitis -, C  Endo/Other  negative endocrine ROSdiabetes, Type 2  Renal/GU negative Renal ROS  negative genitourinary   Musculoskeletal   Abdominal   Peds  Hematology negative hematology ROS (+)   Anesthesia Other Findings   Reproductive/Obstetrics negative OB ROS                             Anesthesia Physical Anesthesia Plan  ASA: 2  Anesthesia Plan: General   Post-op Pain Management:    Induction:   PONV Risk Score and Plan: Propofol infusion  Airway Management Planned:   Additional Equipment:   Intra-op Plan:   Post-operative Plan:   Informed Consent: I have reviewed the patients History and Physical, chart, labs and discussed the procedure including the risks, benefits and alternatives for the proposed anesthesia with the patient or authorized representative who has indicated his/her understanding and acceptance.     Dental Advisory Given  Plan Discussed with: CRNA  Anesthesia Plan Comments:         Anesthesia Quick Evaluation

## 2021-06-18 LAB — SURGICAL PATHOLOGY

## 2021-06-18 NOTE — Anesthesia Postprocedure Evaluation (Signed)
Anesthesia Post Note  Patient: Patricia Jones  Procedure(s) Performed: COLONOSCOPY WITH PROPOFOL ESOPHAGOGASTRODUODENOSCOPY (EGD) WITH PROPOFOL BALLOON DILATION BIOPSY POLYPECTOMY INTESTINAL  Patient location during evaluation: Phase II Anesthesia Type: General Level of consciousness: awake Pain management: pain level controlled Vital Signs Assessment: post-procedure vital signs reviewed and stable Respiratory status: spontaneous breathing and respiratory function stable Cardiovascular status: blood pressure returned to baseline and stable Postop Assessment: no headache and no apparent nausea or vomiting Anesthetic complications: no Comments: Late entry   No notable events documented.   Last Vitals:  Vitals:   06/17/21 1032 06/17/21 1034  BP: (!) 102/58   Pulse: 66 68  Resp: 20 20  Temp: 36.6 C   SpO2: 93%     Last Pain:  Vitals:   06/17/21 1034  TempSrc:   PainSc: 0-No pain                 Louann Sjogren

## 2021-07-01 DIAGNOSIS — E119 Type 2 diabetes mellitus without complications: Secondary | ICD-10-CM | POA: Diagnosis not present

## 2021-07-01 DIAGNOSIS — Z299 Encounter for prophylactic measures, unspecified: Secondary | ICD-10-CM | POA: Diagnosis not present

## 2021-07-01 DIAGNOSIS — J439 Emphysema, unspecified: Secondary | ICD-10-CM | POA: Diagnosis not present

## 2021-07-01 DIAGNOSIS — S90852A Superficial foreign body, left foot, initial encounter: Secondary | ICD-10-CM | POA: Diagnosis not present

## 2021-07-01 DIAGNOSIS — F17210 Nicotine dependence, cigarettes, uncomplicated: Secondary | ICD-10-CM | POA: Diagnosis not present

## 2021-07-02 ENCOUNTER — Ambulatory Visit: Payer: Medicare Other | Admitting: Gastroenterology

## 2021-07-04 ENCOUNTER — Encounter (HOSPITAL_COMMUNITY): Payer: Self-pay | Admitting: Internal Medicine

## 2021-07-09 DIAGNOSIS — Z880 Allergy status to penicillin: Secondary | ICD-10-CM | POA: Diagnosis not present

## 2021-07-09 DIAGNOSIS — F17210 Nicotine dependence, cigarettes, uncomplicated: Secondary | ICD-10-CM | POA: Diagnosis not present

## 2021-07-09 DIAGNOSIS — M797 Fibromyalgia: Secondary | ICD-10-CM | POA: Diagnosis not present

## 2021-07-09 DIAGNOSIS — S92531A Displaced fracture of distal phalanx of right lesser toe(s), initial encounter for closed fracture: Secondary | ICD-10-CM | POA: Diagnosis not present

## 2021-07-09 DIAGNOSIS — Z882 Allergy status to sulfonamides status: Secondary | ICD-10-CM | POA: Diagnosis not present

## 2021-07-09 DIAGNOSIS — S91301A Unspecified open wound, right foot, initial encounter: Secondary | ICD-10-CM | POA: Diagnosis not present

## 2021-07-09 DIAGNOSIS — L84 Corns and callosities: Secondary | ICD-10-CM | POA: Diagnosis not present

## 2021-07-09 DIAGNOSIS — G43909 Migraine, unspecified, not intractable, without status migrainosus: Secondary | ICD-10-CM | POA: Diagnosis not present

## 2021-07-09 DIAGNOSIS — W458XXA Other foreign body or object entering through skin, initial encounter: Secondary | ICD-10-CM | POA: Diagnosis not present

## 2021-07-09 DIAGNOSIS — K219 Gastro-esophageal reflux disease without esophagitis: Secondary | ICD-10-CM | POA: Diagnosis not present

## 2021-07-09 DIAGNOSIS — J449 Chronic obstructive pulmonary disease, unspecified: Secondary | ICD-10-CM | POA: Diagnosis not present

## 2021-07-09 DIAGNOSIS — M795 Residual foreign body in soft tissue: Secondary | ICD-10-CM | POA: Diagnosis not present

## 2021-07-09 DIAGNOSIS — Z79899 Other long term (current) drug therapy: Secondary | ICD-10-CM | POA: Diagnosis not present

## 2021-07-09 DIAGNOSIS — E119 Type 2 diabetes mellitus without complications: Secondary | ICD-10-CM | POA: Diagnosis not present

## 2021-07-15 ENCOUNTER — Other Ambulatory Visit: Payer: Self-pay | Admitting: Family Medicine

## 2021-07-17 ENCOUNTER — Other Ambulatory Visit: Payer: Self-pay | Admitting: Family Medicine

## 2021-07-23 DIAGNOSIS — G4733 Obstructive sleep apnea (adult) (pediatric): Secondary | ICD-10-CM | POA: Diagnosis not present

## 2021-07-23 DIAGNOSIS — G43701 Chronic migraine without aura, not intractable, with status migrainosus: Secondary | ICD-10-CM | POA: Diagnosis not present

## 2021-07-23 DIAGNOSIS — M545 Low back pain, unspecified: Secondary | ICD-10-CM | POA: Diagnosis not present

## 2021-07-23 DIAGNOSIS — Z79891 Long term (current) use of opiate analgesic: Secondary | ICD-10-CM | POA: Diagnosis not present

## 2021-07-23 DIAGNOSIS — M542 Cervicalgia: Secondary | ICD-10-CM | POA: Diagnosis not present

## 2021-07-23 DIAGNOSIS — M62830 Muscle spasm of back: Secondary | ICD-10-CM | POA: Diagnosis not present

## 2021-07-23 DIAGNOSIS — M5459 Other low back pain: Secondary | ICD-10-CM | POA: Diagnosis not present

## 2021-08-04 ENCOUNTER — Encounter: Payer: Self-pay | Admitting: Emergency Medicine

## 2021-08-04 ENCOUNTER — Ambulatory Visit
Admission: EM | Admit: 2021-08-04 | Discharge: 2021-08-04 | Disposition: A | Discharge status: Home / Self Care | Payer: Medicare Other

## 2021-08-04 DIAGNOSIS — K0889 Other specified disorders of teeth and supporting structures: Secondary | ICD-10-CM

## 2021-08-04 MED ORDER — IBUPROFEN 800 MG PO TABS: 800.0000 mg | ORAL_TABLET | Freq: Three times a day (TID) | ORAL | 0 refills | Status: AC | PRN

## 2021-08-04 MED ORDER — AMOXICILLIN 500 MG PO CAPS: 500.0000 mg | ORAL_CAPSULE | Freq: Three times a day (TID) | ORAL | 0 refills | Status: DC

## 2021-08-04 NOTE — ED Provider Notes (Signed)
[facility]    CSN: [id number] Arrival date & time: 08/04/21  1549      History   Chief Complaint No chief complaint on file.   HPI Patricia Jones is a 66 y.o. female.   The history is provided by the patient.   Patient presents for complaints of dental pain that has been present for the past 2 days.  Patient reports a history of dental caries and missing or broken teeth.  Patient states that since her symptoms started, she has had left-sided facial swelling, pain with talking, swallowing, chewing, or eating.  She denies fever, chills, chest pain, abdominal pain nausea, vomiting, or diarrhea.  States she has been taking ibuprofen for her symptoms with minimal relief.  Is a current smoker.  States that she has tried to get in with a dentist, but states it is too expensive.  She states that she has been told that she needs an Chief Financial Officer.  Past Medical History:  Diagnosis Date   Allergy    Chronic migraine without aura, with intractable migraine, so stated, without mention of status migrainosus 12/08/2012   Depression    DJD (degenerative joint disease)    Fibromyalgia    GERD (gastroesophageal reflux disease)    Hepatitis C 1980   symptomatic w/ jaundice, NEGATIVE HCV RNA  08/2005, positive hepatitis C antibody but negative HCVRNA in 2019   Hypersomnia with sleep apnea, unspecified    Insomnia    Lumbago 12/08/2012   Migraines    Myalgia and myositis, unspecified 12/08/2012   Overactive bladder    S/P colonoscopy 10/29/1999   Dr Arita Miss polyps-hyperplastic   S/P colonoscopy 09/12/2005   4 hyperplastic polyps   Sleep apnea    CPAP   Tobacco dependence    Type II or unspecified type diabetes mellitus without mention of complication, not stated as uncontrolled    Unspecified hereditary and idiopathic peripheral neuropathy     Patient Active Problem List   Diagnosis Date Noted   Thyroid disorder 11/26/2018   COPD (chronic obstructive pulmonary disease) ([place])  11/26/2018   Cecal polyp    Hyperplastic polyp of ascending colon    Erosive gastritis    Esophageal dysphagia 08/11/2017   Thyroid goiter 05/18/2017   Hypersomnia with sleep apnea 11/03/2013   Fibromyalgia 11/03/2013   Idiopathic peripheral neuropathy 11/03/2013   Diabetes mellitus, type 2 ([place]) 11/03/2013   Overactive bladder 11/03/2013   Lumbar radiculopathy 11/03/2013   Chronic migraine without aura, with intractable migraine, so stated, without mention of status migrainosus 12/08/2012   Osteoporosis 12/03/2010   Tobacco dependence    Insomnia    GERD (gastroesophageal reflux disease) 07/11/2010   Hx of colonic polyps 07/11/2010   History of hepatitis C 07/11/2010    Past Surgical History:  Procedure Laterality Date   ABDOMINAL HYSTERECTOMY  10/30/1999   TAH/BSO   BALLOON DILATION N/A 06/17/2021   Procedure: BALLOON DILATION;  Surgeon: [name], DO;  [facility];  Service: Endoscopy;  Laterality: N/A;   BIOPSY  06/17/2021   Procedure: BIOPSY;  Surgeon: [name], DO;  [facility];  Service: Endoscopy;;   COLONOSCOPY     2007, 2001, hyperplastic polyps   COLONOSCOPY WITH PROPOFOL N/A 10/20/2017   Procedure: COLONOSCOPY WITH PROPOFOL;  Surgeon: Danie Binder, MD;  [facility]: [facility];  Service: Endoscopy;  Laterality: N/A;  10:45am   COLONOSCOPY WITH PROPOFOL N/A 06/17/2021   Procedure: COLONOSCOPY WITH PROPOFOL;  Surgeon:  [name], DO;  [facility];  Service: Endoscopy;  Laterality: N/A;  10:30am   complete hyst  2001   ESOPHAGOGASTRODUODENOSCOPY (EGD) WITH PROPOFOL N/A 10/20/2017   Procedure: ESOPHAGOGASTRODUODENOSCOPY (EGD) WITH PROPOFOL;  Surgeon: Danie Binder, MD;  [facility];  Service: Endoscopy;  Laterality: N/A;   ESOPHAGOGASTRODUODENOSCOPY (EGD) WITH PROPOFOL N/A 06/17/2021   Procedure: ESOPHAGOGASTRODUODENOSCOPY (EGD) WITH PROPOFOL;  Surgeon: [name], DO;  [facility];   Service: Endoscopy;  Laterality: N/A;   EXPLORATORY LAPAROTOMY     Ruptured ovarian cyst with hemorrhage in her 69s   FOOT SURGERY Left    HIP SURGERY     laproscopic left   PELVIC LAPAROSCOPY     Multiple for ovarian cysts and endometriosis, infertility issues   POLYPECTOMY  10/20/2017   Procedure: POLYPECTOMY;  Surgeon: Danie Binder, MD;  [facility];  Service: Endoscopy;;   POLYPECTOMY  06/17/2021   Procedure: POLYPECTOMY INTESTINAL;  Surgeon: [name], DO;  [facility];  Service: Endoscopy;;   SAVORY DILATION N/A 10/20/2017   Procedure: SAVORY DILATION;  Surgeon: Danie Binder, MD;  [facility];  Service: Endoscopy;  Laterality: N/A;   SINUS SURGERY WITH INSTATRAK      OB History     Gravida  3   Para  2   Term  2   Preterm      AB  1   Living  2      SAB  1   IAB      Ectopic      Multiple      Live Births  2            Home Medications    Prior to Admission medications   Medication Sig Start Date End Date Taking? Authorizing Provider  amitriptyline (ELAVIL) 10 MG tablet Take 10 mg by mouth at bedtime.   Yes [provider]  amoxicillin (AMOXIL) 500 MG capsule Take 1 capsule (500 mg total) by mouth 3 (three) times daily. 08/04/21  Yes [place] Santilli-Warren, Alda Lea, NP  ibuprofen (ADVIL) 800 MG tablet Take 1 tablet (800 mg total) by mouth every 8 (eight) hours as needed. 08/04/21  Yes Jeannia Tatro-Warren, Alda Lea, NP  AIMOVIG 140 MG/ML SOAJ Inject 140 mg into the skin every 30 (thirty) days. 01/15/21   [provider]  albuterol (PROVENTIL) (2.5 MG/3ML) 0.083% nebulizer solution Take 2.5 mg by nebulization every 6 (six) hours as needed for wheezing or shortness of breath.    [provider]  albuterol (VENTOLIN HFA) 108 (90 Base) MCG/ACT inhaler INHALE 2 PUFFS INTO THE LUNGS EVERY 4 HOURS AS NEEDED FOR WHEEZING OR SHORTNESS OF BREATH. 01/11/20   Alycia Rossetti, MD  baclofen (LIORESAL) 10 MG  tablet Take 10 mg by mouth 3 (three) times daily as needed for muscle spasms.    [provider]  BREO ELLIPTA 100-25 MCG/ACT AEPB INHALE 1 PUFF INTO LUNGS DAILY. Patient taking differently: Inhale 1 puff into the lungs at bedtime. 04/15/21   Susy Frizzle, MD  buprenorphine (SUBUTEX) 2 MG SUBL SL tablet Place 2 mg under the tongue in the morning and at bedtime. May hold 2nd dose if not having terrible pain day    [provider]  butalbital-acetaminophen-caffeine (FIORICET, ESGIC) 50-325-40 MG per tablet Take 1-2 tablets by mouth every 4 (four) hours as needed for headache or migraine. 07/09/10   [provider]  Calcium  Carb-Cholecalciferol 600-20 MG-MCG CHEW Chew 1-2 tablets by mouth See admin instructions. Alternate between taking 2 tablets on day & then taking 1 tablet the next day    [provider]  diazepam (VALIUM) 10 MG tablet Take 10 mg by mouth at bedtime as needed (spasms/sleep).    [provider]  ergocalciferol (VITAMIN D2) 1.25 MG (50000 UT) capsule Take 50,000 Units by mouth every Friday.    [provider]  FLUoxetine (PROZAC) 40 MG capsule Take 40 mg by mouth every evening.    [provider]  fluticasone (FLONASE) 50 MCG/ACT nasal spray Place 1-2 sprays into both nostrils daily as needed for allergies or rhinitis.    [provider]  gabapentin (NEURONTIN) 600 MG tablet Take 600 mg by mouth 3 (three) times daily.    [provider]  GAVILYTE-G 236 g solution Take 4,000 mLs by mouth as directed. 05/23/21   [provider]  ondansetron (ZOFRAN) 4 MG tablet Take 1 tablet (4 mg total) by mouth every 8 (eight) hours as needed for nausea or vomiting. 05/23/21 08/21/21  [name], DO  pantoprazole (PROTONIX) 40 MG tablet Take 1 tablet (40 mg total) by mouth 2 (two) times daily. 1 po 30 mins prior to first meal 06/17/21 12/14/21  [name], DO  traMADol (ULTRAM) 50 MG tablet Take 50-100 mg  by mouth every 6 (six) hours as needed (pain.).    [provider]    Family History Family History  Problem Relation Age of Onset   Aneurysm Mother    Heart disease Mother    Coronary artery disease Father    Stroke Father    Heart disease Father    Cervical cancer Sister    Ovarian cancer Sister    Colon cancer Maternal Grandmother        greater than age 28. was her great grandmother   Diabetes Maternal Aunt     Social History Social History   Tobacco Use   Smoking status: Every Day    Packs/day: 1.00    Years: 40.00    Total pack years: 40.00    Types: Cigarettes    Start date: 12/25/1973   Smokeless tobacco: Never  Vaping Use   Vaping Use: Never used  Substance Use Topics   Alcohol use: No    Comment: heavy etoh x 30 yrs, quit 14 yrs ago   Drug use: Yes    Types: Marijuana    Comment: last used 06/12/21     Allergies   Other   Review of Systems Review of Systems Per HPI  Physical Exam Triage Vital Signs ED Triage Vitals  Enc Vitals Group     BP 08/04/21 1555 129/79     Pulse Rate 08/04/21 1555 70     Resp 08/04/21 1555 18     Temp 08/04/21 1555 98.7 F (37.1 C)     Temp Source 08/04/21 1555 Oral     SpO2 08/04/21 1555 92 %     Weight --      Height --      Head Circumference --      Peak Flow --      Pain Score 08/04/21 1556 10     Pain Loc --      Pain Edu? --      Excl. in [place]? --    No data found.  Updated Vital Signs BP 129/79 (BP Location: Right Arm)   Pulse 70   Temp  98.7 F (37.1 C) (Oral)   Resp 18   SpO2 92%   Visual Acuity Right Eye Distance:   Left Eye Distance:   Bilateral Distance:    Right Eye Near:   Left Eye Near:    Bilateral Near:     Physical Exam Vitals and nursing note reviewed.  Constitutional:      General: She is not in acute distress.    Appearance: Normal appearance.  HENT:     Head: Normocephalic.     Mouth/Throat:     Lips: Pink.     Mouth: Mucous membranes are moist.      Dentition: Abnormal dentition. Dental tenderness, gingival swelling, dental caries and dental abscesses present.     Pharynx: Oropharynx is clear.     Comments: Multiple dental caries noted.  Left-sided facial swelling is present. Eyes:     Extraocular Movements: Extraocular movements intact.     Pupils: Pupils are equal, round, and reactive to light.  Pulmonary:     Effort: Pulmonary effort is normal.  Skin:    General: Skin is warm and dry.  Neurological:     General: No focal deficit present.     Mental Status: She is alert and oriented to person, place, and time.  Psychiatric:        Mood and Affect: Mood normal.        Thought Content: Thought content normal.      UC Treatments / Results  Labs (all labs ordered are listed, but only abnormal results are displayed) Labs Reviewed - No data to display  EKG   Radiology No results found.  Procedures Procedures (including critical care time)  Medications Ordered in UC Medications - No data to display  Initial Impression / Assessment and Plan / UC Course  I have reviewed the triage vital signs and the nursing notes.  Pertinent labs & imaging results that were available during my care of the patient were reviewed by me and considered in my medical decision making (see chart for details).  Patient presents with dental pain has been present for the past 2 days.  Patient has multiple dental caries, missing teeth, chipped teeth, and facial swelling on the right side of face.  Symptoms are consistent with dentalgia and dental abscess.  Will start patient on amoxicillin for 7 days, along with ibuprofen for pain.  Patient was advised to start Tylenol in conjunction with ibuprofen to help with pain control.  Supportive care recommendations were provided to the patient.  Dental resource guide was also provided to the patient and encourage follow-up with a dentist within the next 7 to 10 days.  Patient advised to follow-up as  needed.  Final Clinical Impressions(s) / UC Diagnoses   Final diagnoses:  Dentalgia     Discharge Instructions         Take medication as prescribed. May take over-the-counter Tylenol extra strength 1 to 2 hours after ibuprofen for breakthrough pain. Try to stop smoking while your symptoms persist as this will make your dental pain worse. Warm salt water gargles 3-4 times daily until symptoms improve. Continue warm compresses to the affected area to help with pain and discomfort. I have provided a dental resource guide for you to follow-up with a dentist.  Recommend following up within the next 7 to 10 days.     ED Prescriptions     Medication Sig Dispense Auth. Provider   amoxicillin (AMOXIL) 500 MG capsule Take 1 capsule (500 mg total)  by mouth 3 (three) times daily. 21 capsule Aradhya Shellenbarger-Warren, Alda Lea, NP   ibuprofen (ADVIL) 800 MG tablet Take 1 tablet (800 mg total) by mouth every 8 (eight) hours as needed. 30 tablet Bruno Leach-Warren, Alda Lea, NP      PDMP not reviewed this encounter.   Tish Men, NP 08/04/21 [phone]

## 2021-08-04 NOTE — Discharge Instructions (Addendum)
  Take medication as prescribed. May take over-the-counter Tylenol extra strength 1 to 2 hours after ibuprofen for breakthrough pain. Try to stop smoking while your symptoms persist as this will make your dental pain worse. Warm salt water gargles 3-4 times daily until symptoms improve. Continue warm compresses to the affected area to help with pain and discomfort. I have provided a dental resource guide for you to follow-up with a dentist.  Recommend following up within the next 7 to 10 days.

## 2021-08-04 NOTE — ED Triage Notes (Signed)
Dental pain on upper left side of mouth since Friday.

## 2021-08-27 DIAGNOSIS — M79671 Pain in right foot: Secondary | ICD-10-CM | POA: Diagnosis not present

## 2021-08-28 DIAGNOSIS — M7989 Other specified soft tissue disorders: Secondary | ICD-10-CM | POA: Diagnosis not present

## 2021-08-28 DIAGNOSIS — M79671 Pain in right foot: Secondary | ICD-10-CM | POA: Diagnosis not present

## 2021-08-28 DIAGNOSIS — S92531A Displaced fracture of distal phalanx of right lesser toe(s), initial encounter for closed fracture: Secondary | ICD-10-CM | POA: Diagnosis not present

## 2021-09-14 IMAGING — DX DG CHEST 1V PORT
1 series · 1 of 1 positions shown · non-contrast
Comparison: 02/02/2018

CLINICAL DATA: Fever and emesis

EXAM:
PORTABLE CHEST 1 VIEW

[chest ap]
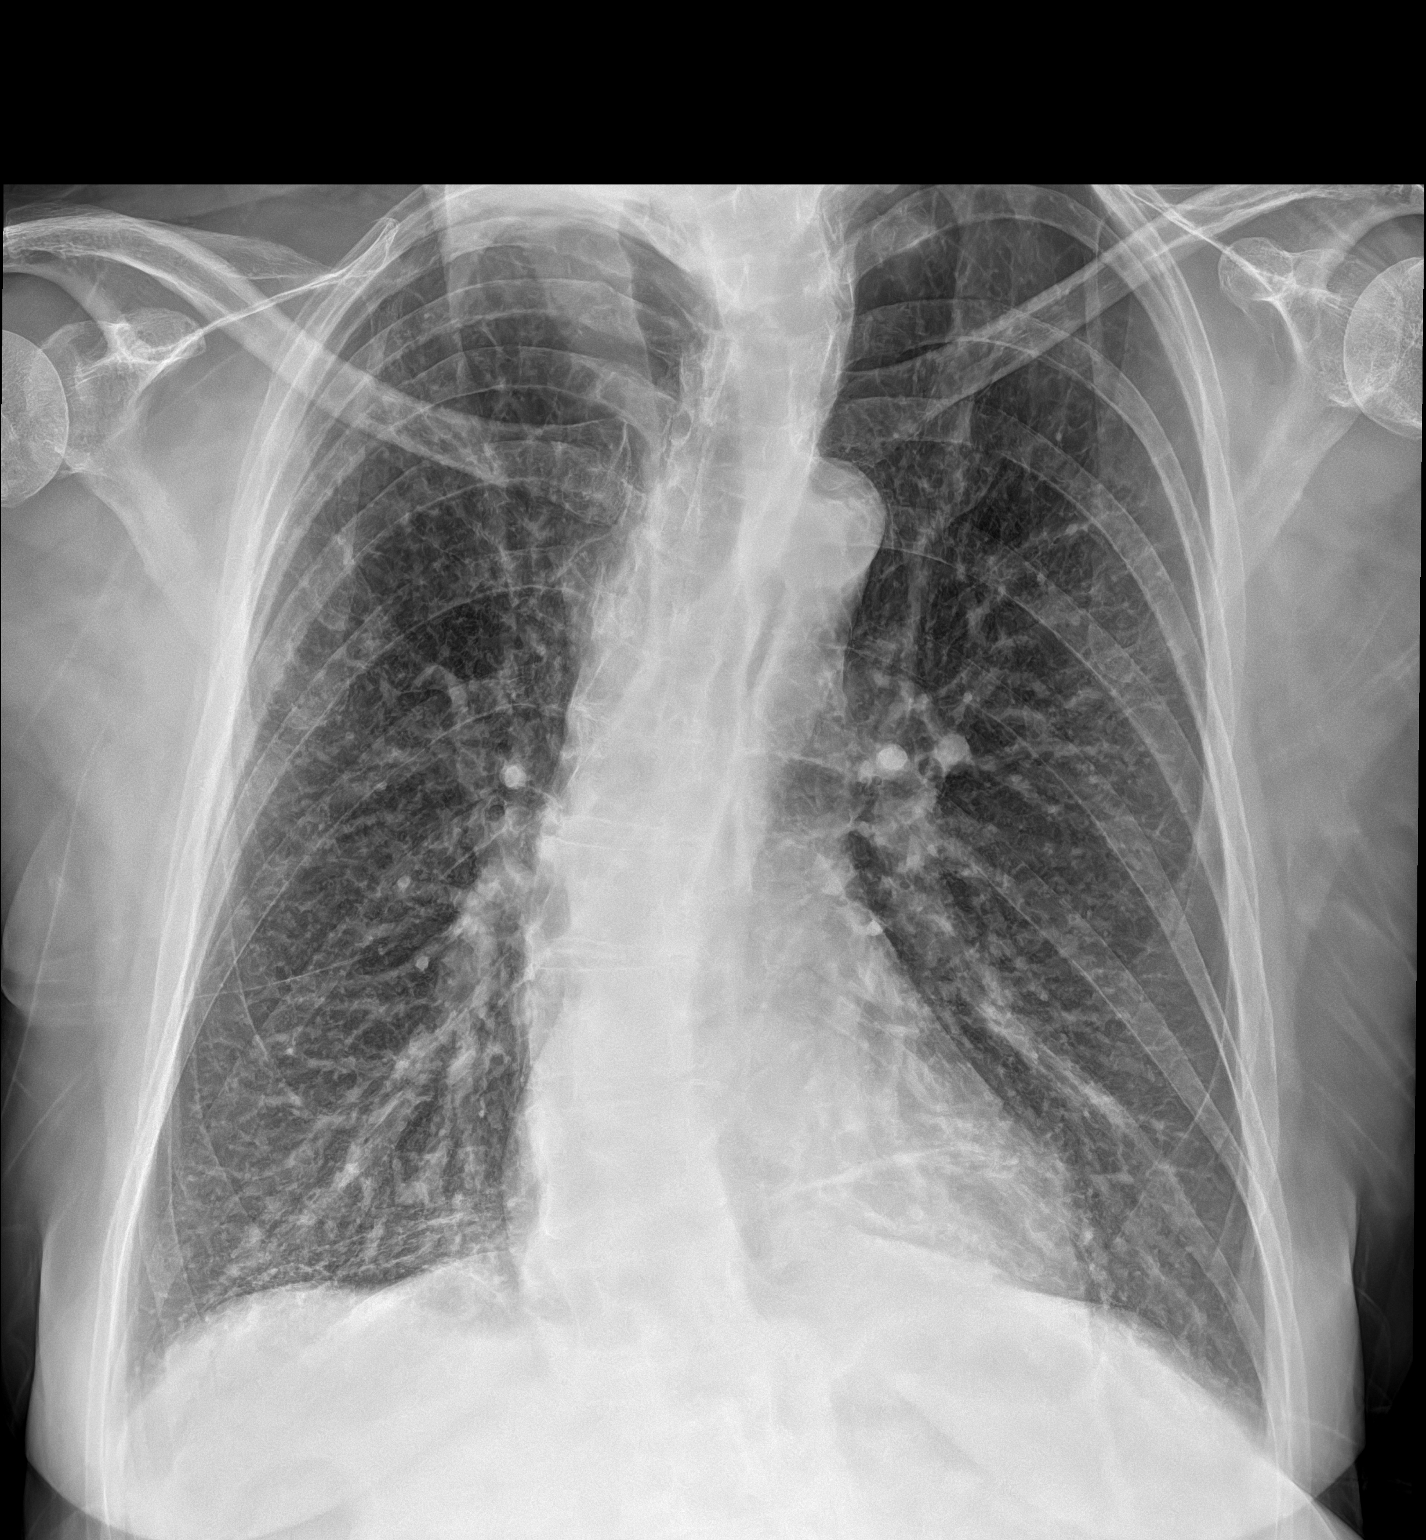

[1 of 1 positions shown; findings below may reference images not displayed]

FINDINGS: Hyperinflation with emphysematous disease. Diffuse coarse chronic
interstitial opacity with linear scarring or atelectasis at the
bases. No acute consolidation or pleural effusion. Stable
cardiomediastinal silhouette. No pneumothorax. Scoliosis of the
spine.
IMPRESSION: No active disease. Hyperinflation with emphysematous disease and
chronic coarse interstitial opacities.

## 2021-09-19 DIAGNOSIS — M79671 Pain in right foot: Secondary | ICD-10-CM | POA: Diagnosis not present

## 2021-09-19 DIAGNOSIS — S90851A Superficial foreign body, right foot, initial encounter: Secondary | ICD-10-CM | POA: Diagnosis not present

## 2021-10-03 DIAGNOSIS — Z8619 Personal history of other infectious and parasitic diseases: Secondary | ICD-10-CM | POA: Diagnosis not present

## 2021-10-03 DIAGNOSIS — K219 Gastro-esophageal reflux disease without esophagitis: Secondary | ICD-10-CM | POA: Diagnosis not present

## 2021-10-03 DIAGNOSIS — S90851A Superficial foreign body, right foot, initial encounter: Secondary | ICD-10-CM | POA: Diagnosis not present

## 2021-10-03 DIAGNOSIS — M1990 Unspecified osteoarthritis, unspecified site: Secondary | ICD-10-CM | POA: Diagnosis not present

## 2021-10-03 DIAGNOSIS — J449 Chronic obstructive pulmonary disease, unspecified: Secondary | ICD-10-CM | POA: Diagnosis not present

## 2021-10-03 DIAGNOSIS — F17200 Nicotine dependence, unspecified, uncomplicated: Secondary | ICD-10-CM | POA: Diagnosis not present

## 2021-10-03 DIAGNOSIS — Z7951 Long term (current) use of inhaled steroids: Secondary | ICD-10-CM | POA: Diagnosis not present

## 2021-10-03 DIAGNOSIS — L720 Epidermal cyst: Secondary | ICD-10-CM | POA: Diagnosis not present

## 2021-10-03 DIAGNOSIS — G43909 Migraine, unspecified, not intractable, without status migrainosus: Secondary | ICD-10-CM | POA: Diagnosis not present

## 2021-10-03 DIAGNOSIS — E1140 Type 2 diabetes mellitus with diabetic neuropathy, unspecified: Secondary | ICD-10-CM | POA: Diagnosis not present

## 2021-10-03 DIAGNOSIS — G4730 Sleep apnea, unspecified: Secondary | ICD-10-CM | POA: Diagnosis not present

## 2021-10-03 DIAGNOSIS — M797 Fibromyalgia: Secondary | ICD-10-CM | POA: Diagnosis not present

## 2021-10-03 DIAGNOSIS — M810 Age-related osteoporosis without current pathological fracture: Secondary | ICD-10-CM | POA: Diagnosis not present

## 2021-10-07 DIAGNOSIS — G43701 Chronic migraine without aura, not intractable, with status migrainosus: Secondary | ICD-10-CM | POA: Diagnosis not present

## 2021-10-07 DIAGNOSIS — M542 Cervicalgia: Secondary | ICD-10-CM | POA: Diagnosis not present

## 2021-10-07 DIAGNOSIS — M62830 Muscle spasm of back: Secondary | ICD-10-CM | POA: Diagnosis not present

## 2021-10-07 DIAGNOSIS — G4733 Obstructive sleep apnea (adult) (pediatric): Secondary | ICD-10-CM | POA: Diagnosis not present

## 2021-10-07 DIAGNOSIS — M545 Low back pain, unspecified: Secondary | ICD-10-CM | POA: Diagnosis not present

## 2021-11-18 DIAGNOSIS — M797 Fibromyalgia: Secondary | ICD-10-CM | POA: Diagnosis not present

## 2021-11-18 DIAGNOSIS — Z299 Encounter for prophylactic measures, unspecified: Secondary | ICD-10-CM | POA: Diagnosis not present

## 2021-11-18 DIAGNOSIS — J439 Emphysema, unspecified: Secondary | ICD-10-CM | POA: Diagnosis not present

## 2021-11-18 DIAGNOSIS — E119 Type 2 diabetes mellitus without complications: Secondary | ICD-10-CM | POA: Diagnosis not present

## 2021-11-19 ENCOUNTER — Other Ambulatory Visit: Payer: Self-pay | Admitting: Internal Medicine

## 2021-11-19 NOTE — Telephone Encounter (Signed)
Pt request refill on Pantoprazole DR 40 mg tab. Her last ov was 05/23/2021.

## 2021-11-20 NOTE — Telephone Encounter (Signed)
Phoned the pt no ans/vm

## 2021-11-21 NOTE — Telephone Encounter (Signed)
Pt notified of Rx

## 2021-11-27 ENCOUNTER — Ambulatory Visit
Admission: EM | Admit: 2021-11-27 | Discharge: 2021-11-27 | Disposition: A | Discharge status: Home / Self Care | Payer: Medicare Other | Attending: Nurse Practitioner | Admitting: Nurse Practitioner

## 2021-11-27 DIAGNOSIS — S0502XA Injury of conjunctiva and corneal abrasion without foreign body, left eye, initial encounter: Secondary | ICD-10-CM | POA: Diagnosis not present

## 2021-11-27 MED ORDER — ERYTHROMYCIN 5 MG/GM OP OINT: TOPICAL_OINTMENT | Freq: Four times a day (QID) | OPHTHALMIC | 0 refills | Status: AC

## 2021-11-27 NOTE — ED Provider Notes (Signed)
[facility]    CSN: [id number] Arrival date & time: 11/27/21  1116      History   Chief Complaint Chief Complaint  Patient presents with   Eye Problem    HPI Patricia Jones is a 66 y.o. female.   Patient presents with left eye pain after accidentally dropping a burning cigarette in her left lower eye last night while laying in bed.  She endorses foreign body sensation, lower eye redness, itching.  Denies significant visual impairment (patient reports she is legally blind in her left eye at baseline), eye drainage, swelling around the eye, excessive tearing, headache, floaters in her vision, or contact lens use.  Has not tried nothing so far.  Reports she has an eye appointment coming up with a new ophthalmologist because she needs glasses.   No known allergies to antibiotic therapy.    Past Medical History:  Diagnosis Date   Allergy    Chronic migraine without aura, with intractable migraine, so stated, without mention of status migrainosus 12/08/2012   Depression    DJD (degenerative joint disease)    Fibromyalgia    GERD (gastroesophageal reflux disease)    Hepatitis C 1980   symptomatic w/ jaundice, NEGATIVE HCV RNA  08/2005, positive hepatitis C antibody but negative HCVRNA in 2019   Hypersomnia with sleep apnea, unspecified    Insomnia    Lumbago 12/08/2012   Migraines    Myalgia and myositis, unspecified 12/08/2012   Overactive bladder    S/P colonoscopy 10/29/1999   Dr Arita Miss polyps-hyperplastic   S/P colonoscopy 09/12/2005   4 hyperplastic polyps   Sleep apnea    CPAP   Tobacco dependence    Type II or unspecified type diabetes mellitus without mention of complication, not stated as uncontrolled    Unspecified hereditary and idiopathic peripheral neuropathy     Patient Active Problem List   Diagnosis Date Noted   Thyroid disorder 11/26/2018   COPD (chronic obstructive pulmonary disease) ([place]) 11/26/2018   Cecal polyp    Hyperplastic polyp  of ascending colon    Erosive gastritis    Esophageal dysphagia 08/11/2017   Thyroid goiter 05/18/2017   Hypersomnia with sleep apnea 11/03/2013   Fibromyalgia 11/03/2013   Idiopathic peripheral neuropathy 11/03/2013   Diabetes mellitus, type 2 ([place]) 11/03/2013   Overactive bladder 11/03/2013   Lumbar radiculopathy 11/03/2013   Chronic migraine without aura, with intractable migraine, so stated, without mention of status migrainosus 12/08/2012   Osteoporosis 12/03/2010   Tobacco dependence    Insomnia    GERD (gastroesophageal reflux disease) 07/11/2010   Hx of colonic polyps 07/11/2010   History of hepatitis C 07/11/2010    Past Surgical History:  Procedure Laterality Date   ABDOMINAL HYSTERECTOMY  10/30/1999   TAH/BSO   BALLOON DILATION N/A 06/17/2021   Procedure: BALLOON DILATION;  Surgeon: [name], DO;  [facility];  Service: Endoscopy;  Laterality: N/A;   BIOPSY  06/17/2021   Procedure: BIOPSY;  Surgeon: [name], DO;  [facility];  Service: Endoscopy;;   COLONOSCOPY     2007, 2001, hyperplastic polyps   COLONOSCOPY WITH PROPOFOL N/A 10/20/2017   Procedure: COLONOSCOPY WITH PROPOFOL;  Surgeon: Danie Binder, MD;  [facility]: [facility];  Service: Endoscopy;  Laterality: N/A;  10:45am   COLONOSCOPY WITH PROPOFOL N/A 06/17/2021   Procedure: COLONOSCOPY WITH PROPOFOL;  Surgeon: [name], DO;  [facility];  Service: Endoscopy;  Laterality: N/A;  10:30am   complete hyst  2001   ESOPHAGOGASTRODUODENOSCOPY (EGD) WITH PROPOFOL N/A 10/20/2017   Procedure: ESOPHAGOGASTRODUODENOSCOPY (EGD) WITH PROPOFOL;  Surgeon: Danie Binder, MD;  [facility];  Service: Endoscopy;  Laterality: N/A;   ESOPHAGOGASTRODUODENOSCOPY (EGD) WITH PROPOFOL N/A 06/17/2021   Procedure: ESOPHAGOGASTRODUODENOSCOPY (EGD) WITH PROPOFOL;  Surgeon: [name], DO;  [facility];  Service: Endoscopy;  Laterality: N/A;    EXPLORATORY LAPAROTOMY     Ruptured ovarian cyst with hemorrhage in her 68s   FOOT SURGERY Left    HIP SURGERY     laproscopic left   PELVIC LAPAROSCOPY     Multiple for ovarian cysts and endometriosis, infertility issues   POLYPECTOMY  10/20/2017   Procedure: POLYPECTOMY;  Surgeon: Danie Binder, MD;  [facility];  Service: Endoscopy;;   POLYPECTOMY  06/17/2021   Procedure: POLYPECTOMY INTESTINAL;  Surgeon: [name], DO;  [facility];  Service: Endoscopy;;   SAVORY DILATION N/A 10/20/2017   Procedure: SAVORY DILATION;  Surgeon: Danie Binder, MD;  [facility];  Service: Endoscopy;  Laterality: N/A;   SINUS SURGERY WITH INSTATRAK      OB History     Gravida  3   Para  2   Term  2   Preterm      AB  1   Living  2      SAB  1   IAB      Ectopic      Multiple      Live Births  2            Home Medications    Prior to Admission medications   Medication Sig Start Date End Date Taking? Authorizing Provider  erythromycin ophthalmic ointment Place into the left eye 4 (four) times daily for 7 days. Place a 1/2 inch ribbon of ointment into the lower eyelid. 11/27/21 12/04/21 Yes Noemi Chapel A, NP  AIMOVIG 140 MG/ML SOAJ Inject 140 mg into the skin every 30 (thirty) days. 01/15/21   [provider]  albuterol (PROVENTIL) (2.5 MG/3ML) 0.083% nebulizer solution Take 2.5 mg by nebulization every 6 (six) hours as needed for wheezing or shortness of breath.    [provider]  albuterol (VENTOLIN HFA) 108 (90 Base) MCG/ACT inhaler INHALE 2 PUFFS INTO THE LUNGS EVERY 4 HOURS AS NEEDED FOR WHEEZING OR SHORTNESS OF BREATH. 01/11/20   Alycia Rossetti, MD  amitriptyline (ELAVIL) 10 MG tablet Take 10 mg by mouth at bedtime.    [provider]  baclofen (LIORESAL) 10 MG tablet Take 10 mg by mouth 3 (three) times daily as needed for muscle spasms.    [provider]  BREO ELLIPTA 100-25 MCG/ACT  AEPB INHALE 1 PUFF INTO LUNGS DAILY. Patient taking differently: Inhale 1 puff into the lungs at bedtime. 04/15/21   Susy Frizzle, MD  buprenorphine (SUBUTEX) 2 MG SUBL SL tablet Place 2 mg under the tongue in the morning and at bedtime. May hold 2nd dose if not having terrible pain day    [provider]  butalbital-acetaminophen-caffeine (FIORICET, ESGIC) 50-325-40 MG per tablet Take 1-2 tablets by mouth every 4 (four) hours as needed for headache or migraine. 07/09/10   [provider]  Calcium Carb-Cholecalciferol 600-20 MG-MCG CHEW Chew 1-2 tablets by mouth See admin instructions. Alternate between taking 2 tablets on day & then taking 1 tablet the next day    [provider]  diazepam (VALIUM) 10 MG tablet Take 10 mg by mouth at bedtime as needed (spasms/sleep).    [provider]  ergocalciferol (VITAMIN D2) 1.25 MG (50000 UT) capsule Take 50,000 Units by mouth every Friday.    [provider]  FLUoxetine (PROZAC) 40 MG capsule Take 40 mg by mouth every evening.    [provider]  fluticasone (FLONASE) 50 MCG/ACT nasal spray Place 1-2 sprays into both nostrils daily as needed for allergies or rhinitis.    [provider]  gabapentin (NEURONTIN) 600 MG tablet Take 600 mg by mouth 3 (three) times daily.    [provider]  GAVILYTE-G 236 g solution Take 4,000 mLs by mouth as directed. 05/23/21   [provider]  ibuprofen (ADVIL) 800 MG tablet Take 1 tablet (800 mg total) by mouth every 8 (eight) hours as needed. 08/04/21   Leath-Warren, Alda Lea, NP  pantoprazole (PROTONIX) 40 MG tablet Take 1 tablet (40 mg total) by mouth 2 (two) times daily. 11/20/21 11/20/22  [name], DO  traMADol (ULTRAM) 50 MG tablet Take 50-100 mg by mouth every 6 (six) hours as needed (pain.).    [provider]    Family History Family History  Problem Relation Age of Onset   Aneurysm Mother    Heart disease Mother     Coronary artery disease Father    Stroke Father    Heart disease Father    Cervical cancer Sister    Ovarian cancer Sister    Colon cancer Maternal Grandmother        greater than age 25. was her great grandmother   Diabetes Maternal Aunt     Social History Social History   Tobacco Use   Smoking status: Every Day    Packs/day: 1.00    Years: 40.00    Total pack years: 40.00    Types: Cigarettes    Start date: 12/25/1973   Smokeless tobacco: Never  Vaping Use   Vaping Use: Never used  Substance Use Topics   Alcohol use: No    Comment: heavy etoh x 30 yrs, quit 14 yrs ago   Drug use: Yes    Types: Marijuana    Comment: last used 06/12/21     Allergies   Other   Review of Systems Review of Systems Per HPI  Physical Exam Triage Vital Signs ED Triage Vitals  Enc Vitals Group     BP 11/27/21 1135 138/84     Pulse Rate 11/27/21 1135 87     Resp 11/27/21 1135 20     Temp 11/27/21 1135 98.2 F (36.8 C)     Temp Source 11/27/21 1135 Oral     SpO2 11/27/21 1135 92 %     Weight --      Height --      Head Circumference --      Peak Flow --      Pain Score 11/27/21 1134 5     Pain Loc --      Pain Edu? --      Excl. in [place]? --    No data found.  Updated Vital Signs BP 138/84 (BP Location: Right Arm)   Pulse 87   Temp 98.2 F (36.8 C) (Oral)   Resp 20   SpO2 92%   Visual Acuity Right Eye Distance:   Left Eye Distance:   Bilateral Distance:    Right Eye Near:   Left Eye Near:    Bilateral Near:  Physical Exam Vitals and nursing note reviewed.  Constitutional:      General: She is not in acute distress.    Appearance: Normal appearance. She is not toxic-appearing.  HENT:     Head: Normocephalic and atraumatic.     Mouth/Throat:     Mouth: Mucous membranes are moist.     Pharynx: Oropharynx is clear.  Eyes:     General: No scleral icterus.       Right eye: No discharge.        Left eye: No discharge.     Extraocular Movements:  Extraocular movements intact.     Right eye: Normal extraocular motion.     Left eye: Normal extraocular motion.     Conjunctiva/sclera:     Left eye: Left conjunctiva is injected.     Pupils: Pupils are equal, round, and reactive to light.     Left eye: Corneal abrasion and fluorescein uptake present.      Comments: Lower half of left conjunctiva slightly erythematous   Pulmonary:     Effort: Pulmonary effort is normal. No respiratory distress.  Musculoskeletal:     Cervical back: Normal range of motion.  Lymphadenopathy:     Cervical: No cervical adenopathy.  Skin:    General: Skin is warm and dry.     Capillary Refill: Capillary refill takes less than 2 seconds.     Coloration: Skin is not jaundiced or pale.     Findings: No erythema.  Neurological:     Mental Status: She is alert and oriented to person, place, and time.  Psychiatric:        Behavior: Behavior is cooperative.      UC Treatments / Results  Labs (all labs ordered are listed, but only abnormal results are displayed) Labs Reviewed - No data to display  EKG   Radiology No results found.  Procedures Procedures (including critical care time)  Medications Ordered in UC Medications - No data to display  Initial Impression / Assessment and Plan / UC Course  I have reviewed the triage vital signs and the nursing notes.  Pertinent labs & imaging results that were available during my care of the patient were reviewed by me and considered in my medical decision making (see chart for details).   Patient is well-appearing, normotensive, afebrile, not tachycardic, not tachypneic, oxygenating well on room air.    Abrasion of left cornea, initial encounter Treat with erythromycin ointment 4 times a day for 7 days Encouraged close follow-up with ophthalmologist with no improvement in symptoms  The patient was given the opportunity to ask questions.  All questions answered to their satisfaction.  The patient is  in agreement to this plan.    Final Clinical Impressions(s) / UC Diagnoses   Final diagnoses:  Abrasion of left cornea, initial encounter     Discharge Instructions      There is an abrasion on your eye right below the iris.  Please start the eye ointment and follow up with an eye specialist if it does not improve over the next week.    ED Prescriptions     Medication Sig Dispense Auth. Provider   erythromycin ophthalmic ointment Place into the left eye 4 (four) times daily for 7 days. Place a 1/2 inch ribbon of ointment into the lower eyelid. 3.5 g Eulogio Bear, NP      PDMP not reviewed this encounter.   Eulogio Bear, NP 11/27/21 [phone]

## 2021-11-27 NOTE — Discharge Instructions (Signed)
There is an abrasion on your eye right below the iris.  Please start the eye ointment and follow up with an eye specialist if it does not improve over the next week.

## 2021-11-27 NOTE — ED Triage Notes (Signed)
Pt reports she burned the left eye with a cigarette last night.

## 2021-11-29 DIAGNOSIS — Z7189 Other specified counseling: Secondary | ICD-10-CM | POA: Diagnosis not present

## 2021-11-29 DIAGNOSIS — I700 Atherosclerosis of aorta: Secondary | ICD-10-CM | POA: Diagnosis not present

## 2021-11-29 DIAGNOSIS — Z23 Encounter for immunization: Secondary | ICD-10-CM | POA: Diagnosis not present

## 2021-11-29 DIAGNOSIS — Z0000 Encounter for general adult medical examination without abnormal findings: Secondary | ICD-10-CM | POA: Diagnosis not present

## 2021-11-29 DIAGNOSIS — J42 Unspecified chronic bronchitis: Secondary | ICD-10-CM | POA: Diagnosis not present

## 2021-11-29 DIAGNOSIS — Z299 Encounter for prophylactic measures, unspecified: Secondary | ICD-10-CM | POA: Diagnosis not present

## 2021-11-29 DIAGNOSIS — Z6820 Body mass index (BMI) 20.0-20.9, adult: Secondary | ICD-10-CM | POA: Diagnosis not present

## 2021-11-29 DIAGNOSIS — F17210 Nicotine dependence, cigarettes, uncomplicated: Secondary | ICD-10-CM | POA: Diagnosis not present

## 2021-12-06 ENCOUNTER — Other Ambulatory Visit (HOSPITAL_COMMUNITY): Payer: Self-pay | Admitting: Internal Medicine

## 2021-12-06 DIAGNOSIS — Z1231 Encounter for screening mammogram for malignant neoplasm of breast: Secondary | ICD-10-CM

## 2021-12-11 DIAGNOSIS — G4733 Obstructive sleep apnea (adult) (pediatric): Secondary | ICD-10-CM | POA: Diagnosis not present

## 2021-12-11 DIAGNOSIS — M545 Low back pain, unspecified: Secondary | ICD-10-CM | POA: Diagnosis not present

## 2021-12-11 DIAGNOSIS — M542 Cervicalgia: Secondary | ICD-10-CM | POA: Diagnosis not present

## 2021-12-11 DIAGNOSIS — G43701 Chronic migraine without aura, not intractable, with status migrainosus: Secondary | ICD-10-CM | POA: Diagnosis not present

## 2021-12-11 DIAGNOSIS — M62830 Muscle spasm of back: Secondary | ICD-10-CM | POA: Diagnosis not present

## 2021-12-17 ENCOUNTER — Ambulatory Visit: Payer: Medicare Other | Admitting: Gastroenterology

## 2021-12-19 ENCOUNTER — Ambulatory Visit (HOSPITAL_COMMUNITY): Payer: Medicare Other

## 2021-12-23 ENCOUNTER — Ambulatory Visit (HOSPITAL_COMMUNITY)
Admission: RE | Admit: 2021-12-23 | Discharge: 2021-12-23 | Disposition: A | Discharge status: Home / Self Care | Payer: Medicare Other | Source: Ambulatory Visit | Attending: Internal Medicine | Admitting: Internal Medicine

## 2021-12-23 DIAGNOSIS — Z1231 Encounter for screening mammogram for malignant neoplasm of breast: Secondary | ICD-10-CM | POA: Insufficient documentation

## 2021-12-24 DIAGNOSIS — E119 Type 2 diabetes mellitus without complications: Secondary | ICD-10-CM | POA: Diagnosis not present

## 2022-01-10 DIAGNOSIS — Z6821 Body mass index (BMI) 21.0-21.9, adult: Secondary | ICD-10-CM | POA: Diagnosis not present

## 2022-01-10 DIAGNOSIS — E7800 Pure hypercholesterolemia, unspecified: Secondary | ICD-10-CM | POA: Diagnosis not present

## 2022-01-10 DIAGNOSIS — E119 Type 2 diabetes mellitus without complications: Secondary | ICD-10-CM | POA: Diagnosis not present

## 2022-01-10 DIAGNOSIS — Z299 Encounter for prophylactic measures, unspecified: Secondary | ICD-10-CM | POA: Diagnosis not present

## 2022-01-10 DIAGNOSIS — J439 Emphysema, unspecified: Secondary | ICD-10-CM | POA: Diagnosis not present

## 2022-01-10 DIAGNOSIS — I700 Atherosclerosis of aorta: Secondary | ICD-10-CM | POA: Diagnosis not present

## 2022-01-10 DIAGNOSIS — Z79899 Other long term (current) drug therapy: Secondary | ICD-10-CM | POA: Diagnosis not present

## 2022-01-10 DIAGNOSIS — Z0000 Encounter for general adult medical examination without abnormal findings: Secondary | ICD-10-CM | POA: Diagnosis not present

## 2022-01-10 DIAGNOSIS — F17210 Nicotine dependence, cigarettes, uncomplicated: Secondary | ICD-10-CM | POA: Diagnosis not present

## 2022-01-22 ENCOUNTER — Telehealth: Payer: Self-pay

## 2022-01-22 NOTE — Telephone Encounter (Signed)
Dr Abbey Chatters,  Documentation from [facility] came over for the pt to change to them as her primary pharmacy and it needs your signature. It will be in the box left side of desk

## 2022-01-24 DIAGNOSIS — E119 Type 2 diabetes mellitus without complications: Secondary | ICD-10-CM | POA: Diagnosis not present

## 2022-01-24 DIAGNOSIS — H35362 Drusen (degenerative) of macula, left eye: Secondary | ICD-10-CM | POA: Diagnosis not present

## 2022-01-24 DIAGNOSIS — H353112 Nonexudative age-related macular degeneration, right eye, intermediate dry stage: Secondary | ICD-10-CM | POA: Diagnosis not present

## 2022-01-24 DIAGNOSIS — D3132 Benign neoplasm of left choroid: Secondary | ICD-10-CM | POA: Diagnosis not present

## 2022-01-27 ENCOUNTER — Other Ambulatory Visit: Payer: Self-pay | Admitting: *Deleted

## 2022-01-27 DIAGNOSIS — Z87891 Personal history of nicotine dependence: Secondary | ICD-10-CM

## 2022-01-27 DIAGNOSIS — F17210 Nicotine dependence, cigarettes, uncomplicated: Secondary | ICD-10-CM

## 2022-01-27 DIAGNOSIS — Z122 Encounter for screening for malignant neoplasm of respiratory organs: Secondary | ICD-10-CM

## 2022-01-27 NOTE — Telephone Encounter (Signed)
The documentation from [facility] came over again. Please advise whether this has been done or ok with me doing this.

## 2022-01-29 NOTE — Telephone Encounter (Signed)
I am okay with the change.

## 2022-01-30 ENCOUNTER — Other Ambulatory Visit: Payer: Self-pay

## 2022-01-30 DIAGNOSIS — R2233 Localized swelling, mass and lump, upper limb, bilateral: Secondary | ICD-10-CM | POA: Diagnosis not present

## 2022-01-30 NOTE — Telephone Encounter (Signed)
Paperwork changed in the system and faxed to [facility] @ [phone]

## 2022-02-11 ENCOUNTER — Other Ambulatory Visit: Payer: Self-pay | Admitting: Surgery

## 2022-02-11 DIAGNOSIS — R2233 Localized swelling, mass and lump, upper limb, bilateral: Secondary | ICD-10-CM

## 2022-02-18 ENCOUNTER — Ambulatory Visit (HOSPITAL_COMMUNITY)
Admission: RE | Admit: 2022-02-18 | Discharge: 2022-02-18 | Disposition: A | Discharge status: Home / Self Care | Payer: Medicare Other | Source: Ambulatory Visit | Attending: Surgery | Admitting: Surgery

## 2022-02-18 DIAGNOSIS — R2233 Localized swelling, mass and lump, upper limb, bilateral: Secondary | ICD-10-CM | POA: Diagnosis not present

## 2022-02-18 DIAGNOSIS — R2231 Localized swelling, mass and lump, right upper limb: Secondary | ICD-10-CM | POA: Diagnosis not present

## 2022-02-18 DIAGNOSIS — R2232 Localized swelling, mass and lump, left upper limb: Secondary | ICD-10-CM | POA: Diagnosis not present

## 2022-03-04 ENCOUNTER — Encounter: Payer: Self-pay | Admitting: Acute Care

## 2022-03-04 ENCOUNTER — Ambulatory Visit (INDEPENDENT_AMBULATORY_CARE_PROVIDER_SITE_OTHER): Payer: Medicare Other | Admitting: Acute Care

## 2022-03-04 DIAGNOSIS — F17210 Nicotine dependence, cigarettes, uncomplicated: Secondary | ICD-10-CM | POA: Diagnosis not present

## 2022-03-04 NOTE — Progress Notes (Signed)
Virtual Visit via Telephone Note  I connected with Patricia Jones on 03/04/22 at  2:30 PM EST by telephone and verified that I am speaking with the correct person using two identifiers.  Location: Patient:  At home Provider:  [address] [place]    I discussed the limitations, risks, security and privacy concerns of performing an evaluation and management service by telephone and the availability of in person appointments. I also discussed with the patient that there may be a patient responsible charge related to this service. The patient expressed understanding and agreed to proceed.   Shared Decision Making Visit Lung Cancer Screening Program [phone])   Eligibility: Age 67 y.o. Pack Years Smoking History Calculation 50 pack year smoking history (# packs/per year x # years smoked) Recent History of coughing up blood  no Unexplained weight loss? no ( >Than 15 pounds within the last 6 months ) Prior History Lung / other cancer no (Diagnosis within the last 5 years already requiring surveillance chest CT Scans). Smoking Status Current Smoker Former Smokers: Years since quit:  NA  Quit Date:  NA  Visit Components: Discussion included one or more decision making aids. yes Discussion included risk/benefits of screening. yes Discussion included potential follow up diagnostic testing for abnormal scans. yes Discussion included meaning and risk of over diagnosis. yes Discussion included meaning and risk of False Positives. yes Discussion included meaning of total radiation exposure. yes  Counseling Included: Importance of adherence to annual lung cancer LDCT screening. yes Impact of comorbidities on ability to participate in the program. yes Ability and willingness to under diagnostic treatment. yes  Smoking Cessation Counseling: Current Smokers:  Discussed importance of smoking cessation. yes Information about tobacco cessation classes and interventions provided to  patient. yes Patient provided with "ticket" for LDCT Scan. yes Symptomatic Patient. no  Counseling NA Diagnosis Code: Tobacco Use Z72.0 Asymptomatic Patient yes  Counseling (Intermediate counseling: > three minutes counseling) [id number] Former Smokers:  Discussed the importance of maintaining cigarette abstinence. yes Diagnosis Code: Personal History of Nicotine Dependence. [id number] Information about tobacco cessation classes and interventions provided to patient. Yes Patient provided with "ticket" for LDCT Scan. yes Written Order for Lung Cancer Screening with LDCT placed in Epic. Yes (CT Chest Lung Cancer Screening Low Dose W/O CM) [id number] Z12.2-Screening of respiratory organs Z87.891-Personal history of nicotine dependence  I have spent 25 minutes of face to face/ virtual visit   time with  Patricia Jones discussing the risks and benefits of lung cancer screening. We viewed / discussed a power point together that explained in detail the above noted topics. We paused at intervals to allow for questions to be asked and answered to ensure understanding.We discussed that the single most powerful action that she can take to decrease her risk of developing lung cancer is to quit smoking. We discussed whether or not she is ready to commit to setting a quit date. We discussed options for tools to aid in quitting smoking including nicotine replacement therapy, non-nicotine medications, support groups, Quit Smart classes, and behavior modification. We discussed that often times setting smaller, more achievable goals, such as eliminating 1 cigarette a day for a week and then 2 cigarettes a day for a week can be helpful in slowly decreasing the number of cigarettes smoked. This allows for a sense of accomplishment as well as providing a clinical benefit. I provided  her  with smoking cessation  information  with contact information for community resources, classes, free  nicotine replacement therapy, and access to  mobile apps, text messaging, and on-line smoking cessation help. I have also provided  her  the office contact information in the event she needs to contact me, or the screening staff. We discussed the time and location of the scan, and that either Patricia Glassman RN, Patricia Prince, RN  or I will call / send a letter with the results within 24-72 hours of receiving them. The patient verbalized understanding of all of  the above and had no further questions upon leaving the office. They have my contact information in the event they have any further questions.  I spent 3 minutes counseling on smoking cessation and the health risks of continued tobacco abuse.  I explained to the patient that there has been a high incidence of coronary artery disease noted on these exams. I explained that this is a non-gated exam therefore degree or severity cannot be determined. This patient is not on statin therapy. I have asked the patient to follow-up with their PCP regarding any incidental finding of coronary artery disease and management with diet or medication as their PCP  feels is clinically indicated. The patient verbalized understanding of the above and had no further questions upon completion of the visit.      Magdalen Spatz, NP 03/04/2022

## 2022-03-04 NOTE — Patient Instructions (Signed)

## 2022-03-05 ENCOUNTER — Ambulatory Visit (HOSPITAL_COMMUNITY)
Admission: RE | Admit: 2022-03-05 | Discharge: 2022-03-05 | Disposition: A | Discharge status: Home / Self Care | Payer: Medicare Other | Source: Ambulatory Visit | Attending: Acute Care | Admitting: Acute Care

## 2022-03-05 DIAGNOSIS — Z122 Encounter for screening for malignant neoplasm of respiratory organs: Secondary | ICD-10-CM | POA: Insufficient documentation

## 2022-03-05 DIAGNOSIS — Z87891 Personal history of nicotine dependence: Secondary | ICD-10-CM | POA: Diagnosis not present

## 2022-03-05 DIAGNOSIS — F17210 Nicotine dependence, cigarettes, uncomplicated: Secondary | ICD-10-CM | POA: Insufficient documentation

## 2022-03-06 ENCOUNTER — Other Ambulatory Visit: Payer: Self-pay

## 2022-03-06 DIAGNOSIS — F17210 Nicotine dependence, cigarettes, uncomplicated: Secondary | ICD-10-CM | POA: Diagnosis not present

## 2022-03-06 DIAGNOSIS — G43909 Migraine, unspecified, not intractable, without status migrainosus: Secondary | ICD-10-CM | POA: Diagnosis not present

## 2022-03-06 DIAGNOSIS — I700 Atherosclerosis of aorta: Secondary | ICD-10-CM | POA: Diagnosis not present

## 2022-03-06 DIAGNOSIS — J439 Emphysema, unspecified: Secondary | ICD-10-CM | POA: Diagnosis not present

## 2022-03-06 DIAGNOSIS — Z299 Encounter for prophylactic measures, unspecified: Secondary | ICD-10-CM | POA: Diagnosis not present

## 2022-03-06 DIAGNOSIS — Z87891 Personal history of nicotine dependence: Secondary | ICD-10-CM

## 2022-03-06 DIAGNOSIS — E119 Type 2 diabetes mellitus without complications: Secondary | ICD-10-CM | POA: Diagnosis not present

## 2022-03-26 DIAGNOSIS — M79676 Pain in unspecified toe(s): Secondary | ICD-10-CM | POA: Diagnosis not present

## 2022-03-26 DIAGNOSIS — M79671 Pain in right foot: Secondary | ICD-10-CM | POA: Diagnosis not present

## 2022-03-26 DIAGNOSIS — M19071 Primary osteoarthritis, right ankle and foot: Secondary | ICD-10-CM | POA: Diagnosis not present

## 2022-03-26 DIAGNOSIS — M7989 Other specified soft tissue disorders: Secondary | ICD-10-CM | POA: Diagnosis not present

## 2022-03-26 DIAGNOSIS — Z299 Encounter for prophylactic measures, unspecified: Secondary | ICD-10-CM | POA: Diagnosis not present

## 2022-03-26 DIAGNOSIS — F17210 Nicotine dependence, cigarettes, uncomplicated: Secondary | ICD-10-CM | POA: Diagnosis not present

## 2022-04-03 DIAGNOSIS — M129 Arthropathy, unspecified: Secondary | ICD-10-CM | POA: Diagnosis not present

## 2022-04-03 DIAGNOSIS — G8929 Other chronic pain: Secondary | ICD-10-CM | POA: Diagnosis not present

## 2022-04-03 DIAGNOSIS — M419 Scoliosis, unspecified: Secondary | ICD-10-CM | POA: Diagnosis not present

## 2022-04-03 DIAGNOSIS — E559 Vitamin D deficiency, unspecified: Secondary | ICD-10-CM | POA: Diagnosis not present

## 2022-04-03 DIAGNOSIS — Z79899 Other long term (current) drug therapy: Secondary | ICD-10-CM | POA: Diagnosis not present

## 2022-04-03 DIAGNOSIS — M542 Cervicalgia: Secondary | ICD-10-CM | POA: Diagnosis not present

## 2022-04-03 DIAGNOSIS — Z6821 Body mass index (BMI) 21.0-21.9, adult: Secondary | ICD-10-CM | POA: Diagnosis not present

## 2022-04-03 DIAGNOSIS — M797 Fibromyalgia: Secondary | ICD-10-CM | POA: Diagnosis not present

## 2022-04-08 DIAGNOSIS — Z299 Encounter for prophylactic measures, unspecified: Secondary | ICD-10-CM | POA: Diagnosis not present

## 2022-04-08 DIAGNOSIS — G5760 Lesion of plantar nerve, unspecified lower limb: Secondary | ICD-10-CM | POA: Diagnosis not present

## 2022-04-08 DIAGNOSIS — I700 Atherosclerosis of aorta: Secondary | ICD-10-CM | POA: Diagnosis not present

## 2022-04-08 DIAGNOSIS — F17210 Nicotine dependence, cigarettes, uncomplicated: Secondary | ICD-10-CM | POA: Diagnosis not present

## 2022-04-08 DIAGNOSIS — J439 Emphysema, unspecified: Secondary | ICD-10-CM | POA: Diagnosis not present

## 2022-04-23 DIAGNOSIS — G8929 Other chronic pain: Secondary | ICD-10-CM | POA: Diagnosis not present

## 2022-04-23 DIAGNOSIS — M542 Cervicalgia: Secondary | ICD-10-CM | POA: Diagnosis not present

## 2022-04-23 DIAGNOSIS — M797 Fibromyalgia: Secondary | ICD-10-CM | POA: Diagnosis not present

## 2022-04-23 DIAGNOSIS — Z6821 Body mass index (BMI) 21.0-21.9, adult: Secondary | ICD-10-CM | POA: Diagnosis not present

## 2022-04-23 DIAGNOSIS — Z79899 Other long term (current) drug therapy: Secondary | ICD-10-CM | POA: Diagnosis not present

## 2022-04-29 DIAGNOSIS — Z79899 Other long term (current) drug therapy: Secondary | ICD-10-CM | POA: Diagnosis not present

## 2022-04-29 DIAGNOSIS — M84371A Stress fracture, right ankle, initial encounter for fracture: Secondary | ICD-10-CM | POA: Diagnosis not present

## 2022-04-29 DIAGNOSIS — R601 Generalized edema: Secondary | ICD-10-CM | POA: Diagnosis not present

## 2022-04-29 DIAGNOSIS — M79671 Pain in right foot: Secondary | ICD-10-CM | POA: Diagnosis not present

## 2022-05-01 DIAGNOSIS — Z7189 Other specified counseling: Secondary | ICD-10-CM | POA: Diagnosis not present

## 2022-05-01 DIAGNOSIS — Z6821 Body mass index (BMI) 21.0-21.9, adult: Secondary | ICD-10-CM | POA: Diagnosis not present

## 2022-05-01 DIAGNOSIS — Z299 Encounter for prophylactic measures, unspecified: Secondary | ICD-10-CM | POA: Diagnosis not present

## 2022-05-01 DIAGNOSIS — F17210 Nicotine dependence, cigarettes, uncomplicated: Secondary | ICD-10-CM | POA: Diagnosis not present

## 2022-05-01 DIAGNOSIS — Z0000 Encounter for general adult medical examination without abnormal findings: Secondary | ICD-10-CM | POA: Diagnosis not present

## 2022-05-01 DIAGNOSIS — I700 Atherosclerosis of aorta: Secondary | ICD-10-CM | POA: Diagnosis not present

## 2022-05-09 DIAGNOSIS — D3132 Benign neoplasm of left choroid: Secondary | ICD-10-CM | POA: Diagnosis not present

## 2022-05-09 DIAGNOSIS — H353112 Nonexudative age-related macular degeneration, right eye, intermediate dry stage: Secondary | ICD-10-CM | POA: Diagnosis not present

## 2022-05-09 DIAGNOSIS — E119 Type 2 diabetes mellitus without complications: Secondary | ICD-10-CM | POA: Diagnosis not present

## 2022-05-27 DIAGNOSIS — L03032 Cellulitis of left toe: Secondary | ICD-10-CM | POA: Diagnosis not present

## 2022-05-28 DIAGNOSIS — Z79899 Other long term (current) drug therapy: Secondary | ICD-10-CM | POA: Diagnosis not present

## 2022-05-28 DIAGNOSIS — M542 Cervicalgia: Secondary | ICD-10-CM | POA: Diagnosis not present

## 2022-05-28 DIAGNOSIS — M797 Fibromyalgia: Secondary | ICD-10-CM | POA: Diagnosis not present

## 2022-05-28 DIAGNOSIS — G43909 Migraine, unspecified, not intractable, without status migrainosus: Secondary | ICD-10-CM | POA: Diagnosis not present

## 2022-05-28 DIAGNOSIS — E119 Type 2 diabetes mellitus without complications: Secondary | ICD-10-CM | POA: Diagnosis not present

## 2022-05-28 DIAGNOSIS — Z6821 Body mass index (BMI) 21.0-21.9, adult: Secondary | ICD-10-CM | POA: Diagnosis not present

## 2022-06-02 DIAGNOSIS — Z79899 Other long term (current) drug therapy: Secondary | ICD-10-CM | POA: Diagnosis not present

## 2022-06-05 DIAGNOSIS — G8929 Other chronic pain: Secondary | ICD-10-CM | POA: Diagnosis not present

## 2022-06-05 DIAGNOSIS — M542 Cervicalgia: Secondary | ICD-10-CM | POA: Diagnosis not present

## 2022-06-25 DIAGNOSIS — M542 Cervicalgia: Secondary | ICD-10-CM | POA: Diagnosis not present

## 2022-06-25 DIAGNOSIS — G43909 Migraine, unspecified, not intractable, without status migrainosus: Secondary | ICD-10-CM | POA: Diagnosis not present

## 2022-06-25 DIAGNOSIS — Z79899 Other long term (current) drug therapy: Secondary | ICD-10-CM | POA: Diagnosis not present

## 2022-06-25 DIAGNOSIS — E119 Type 2 diabetes mellitus without complications: Secondary | ICD-10-CM | POA: Diagnosis not present

## 2022-06-25 DIAGNOSIS — Z6821 Body mass index (BMI) 21.0-21.9, adult: Secondary | ICD-10-CM | POA: Diagnosis not present

## 2022-06-25 DIAGNOSIS — M797 Fibromyalgia: Secondary | ICD-10-CM | POA: Diagnosis not present

## 2022-07-01 DIAGNOSIS — F17210 Nicotine dependence, cigarettes, uncomplicated: Secondary | ICD-10-CM | POA: Diagnosis not present

## 2022-07-01 DIAGNOSIS — G4700 Insomnia, unspecified: Secondary | ICD-10-CM | POA: Diagnosis not present

## 2022-07-01 DIAGNOSIS — J439 Emphysema, unspecified: Secondary | ICD-10-CM | POA: Diagnosis not present

## 2022-07-01 DIAGNOSIS — Z299 Encounter for prophylactic measures, unspecified: Secondary | ICD-10-CM | POA: Diagnosis not present

## 2022-07-01 DIAGNOSIS — M797 Fibromyalgia: Secondary | ICD-10-CM | POA: Diagnosis not present

## 2022-07-06 DIAGNOSIS — M542 Cervicalgia: Secondary | ICD-10-CM | POA: Diagnosis not present

## 2022-07-06 DIAGNOSIS — G8929 Other chronic pain: Secondary | ICD-10-CM | POA: Diagnosis not present

## 2022-07-10 DIAGNOSIS — F17210 Nicotine dependence, cigarettes, uncomplicated: Secondary | ICD-10-CM | POA: Diagnosis not present

## 2022-07-10 DIAGNOSIS — J069 Acute upper respiratory infection, unspecified: Secondary | ICD-10-CM | POA: Diagnosis not present

## 2022-07-10 DIAGNOSIS — I700 Atherosclerosis of aorta: Secondary | ICD-10-CM | POA: Diagnosis not present

## 2022-07-10 DIAGNOSIS — J439 Emphysema, unspecified: Secondary | ICD-10-CM | POA: Diagnosis not present

## 2022-08-01 DIAGNOSIS — G43909 Migraine, unspecified, not intractable, without status migrainosus: Secondary | ICD-10-CM | POA: Diagnosis not present

## 2022-08-01 DIAGNOSIS — Z79899 Other long term (current) drug therapy: Secondary | ICD-10-CM | POA: Diagnosis not present

## 2022-08-01 DIAGNOSIS — M542 Cervicalgia: Secondary | ICD-10-CM | POA: Diagnosis not present

## 2022-08-01 DIAGNOSIS — M797 Fibromyalgia: Secondary | ICD-10-CM | POA: Diagnosis not present

## 2022-08-01 DIAGNOSIS — R030 Elevated blood-pressure reading, without diagnosis of hypertension: Secondary | ICD-10-CM | POA: Diagnosis not present

## 2022-08-01 DIAGNOSIS — E119 Type 2 diabetes mellitus without complications: Secondary | ICD-10-CM | POA: Diagnosis not present

## 2022-08-01 DIAGNOSIS — Z6820 Body mass index (BMI) 20.0-20.9, adult: Secondary | ICD-10-CM | POA: Diagnosis not present

## 2022-08-05 DIAGNOSIS — G8929 Other chronic pain: Secondary | ICD-10-CM | POA: Diagnosis not present

## 2022-08-05 DIAGNOSIS — M542 Cervicalgia: Secondary | ICD-10-CM | POA: Diagnosis not present

## 2022-08-29 DIAGNOSIS — R030 Elevated blood-pressure reading, without diagnosis of hypertension: Secondary | ICD-10-CM | POA: Diagnosis not present

## 2022-08-29 DIAGNOSIS — M542 Cervicalgia: Secondary | ICD-10-CM | POA: Diagnosis not present

## 2022-08-29 DIAGNOSIS — Z6820 Body mass index (BMI) 20.0-20.9, adult: Secondary | ICD-10-CM | POA: Diagnosis not present

## 2022-08-29 DIAGNOSIS — M797 Fibromyalgia: Secondary | ICD-10-CM | POA: Diagnosis not present

## 2022-08-29 DIAGNOSIS — G8929 Other chronic pain: Secondary | ICD-10-CM | POA: Diagnosis not present

## 2022-08-29 DIAGNOSIS — Z79899 Other long term (current) drug therapy: Secondary | ICD-10-CM | POA: Diagnosis not present

## 2022-08-29 DIAGNOSIS — G43909 Migraine, unspecified, not intractable, without status migrainosus: Secondary | ICD-10-CM | POA: Diagnosis not present

## 2022-08-29 DIAGNOSIS — E119 Type 2 diabetes mellitus without complications: Secondary | ICD-10-CM | POA: Diagnosis not present

## 2022-09-05 DIAGNOSIS — M542 Cervicalgia: Secondary | ICD-10-CM | POA: Diagnosis not present

## 2022-09-05 DIAGNOSIS — G8929 Other chronic pain: Secondary | ICD-10-CM | POA: Diagnosis not present

## 2022-09-30 DIAGNOSIS — F17210 Nicotine dependence, cigarettes, uncomplicated: Secondary | ICD-10-CM | POA: Diagnosis not present

## 2022-09-30 DIAGNOSIS — G4700 Insomnia, unspecified: Secondary | ICD-10-CM | POA: Diagnosis not present

## 2022-09-30 DIAGNOSIS — M797 Fibromyalgia: Secondary | ICD-10-CM | POA: Diagnosis not present

## 2022-09-30 DIAGNOSIS — Z23 Encounter for immunization: Secondary | ICD-10-CM | POA: Diagnosis not present

## 2022-09-30 DIAGNOSIS — G43909 Migraine, unspecified, not intractable, without status migrainosus: Secondary | ICD-10-CM | POA: Diagnosis not present

## 2022-09-30 DIAGNOSIS — Z299 Encounter for prophylactic measures, unspecified: Secondary | ICD-10-CM | POA: Diagnosis not present

## 2022-09-30 DIAGNOSIS — R350 Frequency of micturition: Secondary | ICD-10-CM | POA: Diagnosis not present

## 2022-10-04 DIAGNOSIS — F17200 Nicotine dependence, unspecified, uncomplicated: Secondary | ICD-10-CM | POA: Diagnosis not present

## 2022-10-04 DIAGNOSIS — G43909 Migraine, unspecified, not intractable, without status migrainosus: Secondary | ICD-10-CM | POA: Diagnosis not present

## 2022-10-04 DIAGNOSIS — Z79899 Other long term (current) drug therapy: Secondary | ICD-10-CM | POA: Diagnosis not present

## 2022-10-04 DIAGNOSIS — R030 Elevated blood-pressure reading, without diagnosis of hypertension: Secondary | ICD-10-CM | POA: Diagnosis not present

## 2022-10-04 DIAGNOSIS — M542 Cervicalgia: Secondary | ICD-10-CM | POA: Diagnosis not present

## 2022-10-04 DIAGNOSIS — Z6821 Body mass index (BMI) 21.0-21.9, adult: Secondary | ICD-10-CM | POA: Diagnosis not present

## 2022-10-04 DIAGNOSIS — Z9181 History of falling: Secondary | ICD-10-CM | POA: Diagnosis not present

## 2022-10-04 DIAGNOSIS — E119 Type 2 diabetes mellitus without complications: Secondary | ICD-10-CM | POA: Diagnosis not present

## 2022-10-04 DIAGNOSIS — M797 Fibromyalgia: Secondary | ICD-10-CM | POA: Diagnosis not present

## 2022-10-04 DIAGNOSIS — F17210 Nicotine dependence, cigarettes, uncomplicated: Secondary | ICD-10-CM | POA: Diagnosis not present

## 2022-10-06 DIAGNOSIS — G8929 Other chronic pain: Secondary | ICD-10-CM | POA: Diagnosis not present

## 2022-10-06 DIAGNOSIS — M542 Cervicalgia: Secondary | ICD-10-CM | POA: Diagnosis not present

## 2022-10-09 DIAGNOSIS — Z79899 Other long term (current) drug therapy: Secondary | ICD-10-CM | POA: Diagnosis not present

## 2022-10-15 DIAGNOSIS — R0981 Nasal congestion: Secondary | ICD-10-CM | POA: Diagnosis not present

## 2022-10-15 DIAGNOSIS — J439 Emphysema, unspecified: Secondary | ICD-10-CM | POA: Diagnosis not present

## 2022-10-15 DIAGNOSIS — F17210 Nicotine dependence, cigarettes, uncomplicated: Secondary | ICD-10-CM | POA: Diagnosis not present

## 2022-10-15 DIAGNOSIS — J069 Acute upper respiratory infection, unspecified: Secondary | ICD-10-CM | POA: Diagnosis not present

## 2022-11-01 DIAGNOSIS — E119 Type 2 diabetes mellitus without complications: Secondary | ICD-10-CM | POA: Diagnosis not present

## 2022-11-01 DIAGNOSIS — F17200 Nicotine dependence, unspecified, uncomplicated: Secondary | ICD-10-CM | POA: Diagnosis not present

## 2022-11-01 DIAGNOSIS — Z6821 Body mass index (BMI) 21.0-21.9, adult: Secondary | ICD-10-CM | POA: Diagnosis not present

## 2022-11-01 DIAGNOSIS — Z79899 Other long term (current) drug therapy: Secondary | ICD-10-CM | POA: Diagnosis not present

## 2022-11-01 DIAGNOSIS — M797 Fibromyalgia: Secondary | ICD-10-CM | POA: Diagnosis not present

## 2022-11-01 DIAGNOSIS — M542 Cervicalgia: Secondary | ICD-10-CM | POA: Diagnosis not present

## 2022-11-01 DIAGNOSIS — R030 Elevated blood-pressure reading, without diagnosis of hypertension: Secondary | ICD-10-CM | POA: Diagnosis not present

## 2022-11-01 DIAGNOSIS — F17210 Nicotine dependence, cigarettes, uncomplicated: Secondary | ICD-10-CM | POA: Diagnosis not present

## 2022-11-01 DIAGNOSIS — G43709 Chronic migraine without aura, not intractable, without status migrainosus: Secondary | ICD-10-CM | POA: Diagnosis not present

## 2022-11-01 DIAGNOSIS — Z9181 History of falling: Secondary | ICD-10-CM | POA: Diagnosis not present

## 2022-11-05 DIAGNOSIS — Z79899 Other long term (current) drug therapy: Secondary | ICD-10-CM | POA: Diagnosis not present

## 2022-11-05 DIAGNOSIS — M542 Cervicalgia: Secondary | ICD-10-CM | POA: Diagnosis not present

## 2022-11-05 DIAGNOSIS — G8929 Other chronic pain: Secondary | ICD-10-CM | POA: Diagnosis not present

## 2022-11-06 DIAGNOSIS — E559 Vitamin D deficiency, unspecified: Secondary | ICD-10-CM | POA: Diagnosis not present

## 2022-11-06 DIAGNOSIS — F17210 Nicotine dependence, cigarettes, uncomplicated: Secondary | ICD-10-CM | POA: Diagnosis not present

## 2022-11-06 DIAGNOSIS — Z0000 Encounter for general adult medical examination without abnormal findings: Secondary | ICD-10-CM | POA: Diagnosis not present

## 2022-11-06 DIAGNOSIS — Z299 Encounter for prophylactic measures, unspecified: Secondary | ICD-10-CM | POA: Diagnosis not present

## 2022-11-06 DIAGNOSIS — E7800 Pure hypercholesterolemia, unspecified: Secondary | ICD-10-CM | POA: Diagnosis not present

## 2022-11-06 DIAGNOSIS — E119 Type 2 diabetes mellitus without complications: Secondary | ICD-10-CM | POA: Diagnosis not present

## 2022-11-06 DIAGNOSIS — Z79899 Other long term (current) drug therapy: Secondary | ICD-10-CM | POA: Diagnosis not present

## 2022-11-06 DIAGNOSIS — R52 Pain, unspecified: Secondary | ICD-10-CM | POA: Diagnosis not present

## 2022-11-28 DIAGNOSIS — J42 Unspecified chronic bronchitis: Secondary | ICD-10-CM | POA: Diagnosis not present

## 2022-11-28 DIAGNOSIS — J439 Emphysema, unspecified: Secondary | ICD-10-CM | POA: Diagnosis not present

## 2022-11-28 DIAGNOSIS — R5383 Other fatigue: Secondary | ICD-10-CM | POA: Diagnosis not present

## 2022-11-29 DIAGNOSIS — M542 Cervicalgia: Secondary | ICD-10-CM | POA: Diagnosis not present

## 2022-11-29 DIAGNOSIS — Z6821 Body mass index (BMI) 21.0-21.9, adult: Secondary | ICD-10-CM | POA: Diagnosis not present

## 2022-11-29 DIAGNOSIS — E119 Type 2 diabetes mellitus without complications: Secondary | ICD-10-CM | POA: Diagnosis not present

## 2022-11-29 DIAGNOSIS — Z9181 History of falling: Secondary | ICD-10-CM | POA: Diagnosis not present

## 2022-11-29 DIAGNOSIS — Z79899 Other long term (current) drug therapy: Secondary | ICD-10-CM | POA: Diagnosis not present

## 2022-11-29 DIAGNOSIS — M79671 Pain in right foot: Secondary | ICD-10-CM | POA: Diagnosis not present

## 2022-11-29 DIAGNOSIS — F17200 Nicotine dependence, unspecified, uncomplicated: Secondary | ICD-10-CM | POA: Diagnosis not present

## 2022-11-29 DIAGNOSIS — M797 Fibromyalgia: Secondary | ICD-10-CM | POA: Diagnosis not present

## 2022-11-29 DIAGNOSIS — G43709 Chronic migraine without aura, not intractable, without status migrainosus: Secondary | ICD-10-CM | POA: Diagnosis not present

## 2022-11-29 DIAGNOSIS — R0602 Shortness of breath: Secondary | ICD-10-CM | POA: Diagnosis not present

## 2022-12-03 DIAGNOSIS — Z79899 Other long term (current) drug therapy: Secondary | ICD-10-CM | POA: Diagnosis not present

## 2022-12-06 DIAGNOSIS — G8929 Other chronic pain: Secondary | ICD-10-CM | POA: Diagnosis not present

## 2022-12-06 DIAGNOSIS — M542 Cervicalgia: Secondary | ICD-10-CM | POA: Diagnosis not present

## 2022-12-18 DIAGNOSIS — M7661 Achilles tendinitis, right leg: Secondary | ICD-10-CM | POA: Diagnosis not present

## 2022-12-18 DIAGNOSIS — M79671 Pain in right foot: Secondary | ICD-10-CM | POA: Diagnosis not present

## 2022-12-27 DIAGNOSIS — Z79899 Other long term (current) drug therapy: Secondary | ICD-10-CM | POA: Diagnosis not present

## 2022-12-27 DIAGNOSIS — Z6821 Body mass index (BMI) 21.0-21.9, adult: Secondary | ICD-10-CM | POA: Diagnosis not present

## 2022-12-27 DIAGNOSIS — E119 Type 2 diabetes mellitus without complications: Secondary | ICD-10-CM | POA: Diagnosis not present

## 2022-12-27 DIAGNOSIS — F17210 Nicotine dependence, cigarettes, uncomplicated: Secondary | ICD-10-CM | POA: Diagnosis not present

## 2022-12-27 DIAGNOSIS — M542 Cervicalgia: Secondary | ICD-10-CM | POA: Diagnosis not present

## 2022-12-27 DIAGNOSIS — M797 Fibromyalgia: Secondary | ICD-10-CM | POA: Diagnosis not present

## 2022-12-27 DIAGNOSIS — G43709 Chronic migraine without aura, not intractable, without status migrainosus: Secondary | ICD-10-CM | POA: Diagnosis not present

## 2022-12-27 DIAGNOSIS — Z9181 History of falling: Secondary | ICD-10-CM | POA: Diagnosis not present

## 2022-12-30 DIAGNOSIS — Z79899 Other long term (current) drug therapy: Secondary | ICD-10-CM | POA: Diagnosis not present

## 2023-01-01 ENCOUNTER — Other Ambulatory Visit: Payer: Self-pay | Admitting: Acute Care

## 2023-01-01 DIAGNOSIS — F17210 Nicotine dependence, cigarettes, uncomplicated: Secondary | ICD-10-CM

## 2023-01-01 DIAGNOSIS — Z87891 Personal history of nicotine dependence: Secondary | ICD-10-CM

## 2023-01-05 DIAGNOSIS — M542 Cervicalgia: Secondary | ICD-10-CM | POA: Diagnosis not present

## 2023-01-05 DIAGNOSIS — G8929 Other chronic pain: Secondary | ICD-10-CM | POA: Diagnosis not present

## 2023-01-09 DIAGNOSIS — F17210 Nicotine dependence, cigarettes, uncomplicated: Secondary | ICD-10-CM | POA: Diagnosis not present

## 2023-01-09 DIAGNOSIS — J441 Chronic obstructive pulmonary disease with (acute) exacerbation: Secondary | ICD-10-CM | POA: Diagnosis not present

## 2023-01-10 DIAGNOSIS — G43709 Chronic migraine without aura, not intractable, without status migrainosus: Secondary | ICD-10-CM | POA: Diagnosis not present

## 2023-01-10 DIAGNOSIS — Z79899 Other long term (current) drug therapy: Secondary | ICD-10-CM | POA: Diagnosis not present

## 2023-01-10 DIAGNOSIS — Z6822 Body mass index (BMI) 22.0-22.9, adult: Secondary | ICD-10-CM | POA: Diagnosis not present

## 2023-01-10 DIAGNOSIS — M797 Fibromyalgia: Secondary | ICD-10-CM | POA: Diagnosis not present

## 2023-01-10 DIAGNOSIS — R7303 Prediabetes: Secondary | ICD-10-CM | POA: Diagnosis not present

## 2023-01-10 DIAGNOSIS — M542 Cervicalgia: Secondary | ICD-10-CM | POA: Diagnosis not present

## 2023-01-10 DIAGNOSIS — Z9181 History of falling: Secondary | ICD-10-CM | POA: Diagnosis not present

## 2023-01-10 DIAGNOSIS — F17210 Nicotine dependence, cigarettes, uncomplicated: Secondary | ICD-10-CM | POA: Diagnosis not present

## 2023-01-13 DIAGNOSIS — Z79899 Other long term (current) drug therapy: Secondary | ICD-10-CM | POA: Diagnosis not present

## 2023-01-22 DIAGNOSIS — M419 Scoliosis, unspecified: Secondary | ICD-10-CM | POA: Diagnosis not present

## 2023-01-22 DIAGNOSIS — M797 Fibromyalgia: Secondary | ICD-10-CM | POA: Diagnosis not present

## 2023-01-22 DIAGNOSIS — M47816 Spondylosis without myelopathy or radiculopathy, lumbar region: Secondary | ICD-10-CM | POA: Diagnosis not present

## 2023-01-22 DIAGNOSIS — M545 Low back pain, unspecified: Secondary | ICD-10-CM | POA: Diagnosis not present

## 2023-01-22 DIAGNOSIS — M5440 Lumbago with sciatica, unspecified side: Secondary | ICD-10-CM | POA: Diagnosis not present

## 2023-01-22 DIAGNOSIS — M1611 Unilateral primary osteoarthritis, right hip: Secondary | ICD-10-CM | POA: Diagnosis not present

## 2023-01-22 DIAGNOSIS — Z299 Encounter for prophylactic measures, unspecified: Secondary | ICD-10-CM | POA: Diagnosis not present

## 2023-01-22 DIAGNOSIS — M25551 Pain in right hip: Secondary | ICD-10-CM | POA: Diagnosis not present

## 2023-01-22 DIAGNOSIS — M8580 Other specified disorders of bone density and structure, unspecified site: Secondary | ICD-10-CM | POA: Diagnosis not present

## 2023-01-22 DIAGNOSIS — Z9181 History of falling: Secondary | ICD-10-CM | POA: Diagnosis not present

## 2023-01-22 DIAGNOSIS — M810 Age-related osteoporosis without current pathological fracture: Secondary | ICD-10-CM | POA: Diagnosis not present

## 2023-01-22 DIAGNOSIS — M4726 Other spondylosis with radiculopathy, lumbar region: Secondary | ICD-10-CM | POA: Diagnosis not present

## 2023-02-05 DIAGNOSIS — M542 Cervicalgia: Secondary | ICD-10-CM | POA: Diagnosis not present

## 2023-02-05 DIAGNOSIS — G8929 Other chronic pain: Secondary | ICD-10-CM | POA: Diagnosis not present

## 2023-02-20 DIAGNOSIS — Z0000 Encounter for general adult medical examination without abnormal findings: Secondary | ICD-10-CM | POA: Diagnosis not present

## 2023-02-20 DIAGNOSIS — Z6821 Body mass index (BMI) 21.0-21.9, adult: Secondary | ICD-10-CM | POA: Diagnosis not present

## 2023-02-20 DIAGNOSIS — Z79899 Other long term (current) drug therapy: Secondary | ICD-10-CM | POA: Diagnosis not present

## 2023-02-20 DIAGNOSIS — Z9181 History of falling: Secondary | ICD-10-CM | POA: Diagnosis not present

## 2023-02-20 DIAGNOSIS — G43709 Chronic migraine without aura, not intractable, without status migrainosus: Secondary | ICD-10-CM | POA: Diagnosis not present

## 2023-02-20 DIAGNOSIS — M542 Cervicalgia: Secondary | ICD-10-CM | POA: Diagnosis not present

## 2023-02-20 DIAGNOSIS — M797 Fibromyalgia: Secondary | ICD-10-CM | POA: Diagnosis not present

## 2023-02-25 DIAGNOSIS — Z79899 Other long term (current) drug therapy: Secondary | ICD-10-CM | POA: Diagnosis not present

## 2023-03-04 ENCOUNTER — Other Ambulatory Visit: Payer: Self-pay | Admitting: Internal Medicine

## 2023-03-08 DIAGNOSIS — M542 Cervicalgia: Secondary | ICD-10-CM | POA: Diagnosis not present

## 2023-03-08 DIAGNOSIS — G8929 Other chronic pain: Secondary | ICD-10-CM | POA: Diagnosis not present

## 2023-03-09 ENCOUNTER — Ambulatory Visit (HOSPITAL_COMMUNITY)
Admission: RE | Admit: 2023-03-09 | Discharge: 2023-03-09 | Disposition: A | Discharge status: Home / Self Care | Payer: Medicare Other | Source: Ambulatory Visit | Attending: Acute Care | Admitting: Acute Care

## 2023-03-09 DIAGNOSIS — Z87891 Personal history of nicotine dependence: Secondary | ICD-10-CM | POA: Insufficient documentation

## 2023-03-09 DIAGNOSIS — G8929 Other chronic pain: Secondary | ICD-10-CM | POA: Diagnosis not present

## 2023-03-09 DIAGNOSIS — M542 Cervicalgia: Secondary | ICD-10-CM | POA: Diagnosis not present

## 2023-03-09 DIAGNOSIS — F17210 Nicotine dependence, cigarettes, uncomplicated: Secondary | ICD-10-CM | POA: Diagnosis not present

## 2023-03-12 DIAGNOSIS — J439 Emphysema, unspecified: Secondary | ICD-10-CM | POA: Diagnosis not present

## 2023-03-12 DIAGNOSIS — M5440 Lumbago with sciatica, unspecified side: Secondary | ICD-10-CM | POA: Diagnosis not present

## 2023-03-12 DIAGNOSIS — I700 Atherosclerosis of aorta: Secondary | ICD-10-CM | POA: Diagnosis not present

## 2023-03-12 DIAGNOSIS — E1169 Type 2 diabetes mellitus with other specified complication: Secondary | ICD-10-CM | POA: Diagnosis not present

## 2023-03-12 DIAGNOSIS — Z299 Encounter for prophylactic measures, unspecified: Secondary | ICD-10-CM | POA: Diagnosis not present

## 2023-03-12 DIAGNOSIS — R52 Pain, unspecified: Secondary | ICD-10-CM | POA: Diagnosis not present

## 2023-03-12 DIAGNOSIS — F17210 Nicotine dependence, cigarettes, uncomplicated: Secondary | ICD-10-CM | POA: Diagnosis not present

## 2023-03-12 DIAGNOSIS — J42 Unspecified chronic bronchitis: Secondary | ICD-10-CM | POA: Diagnosis not present

## 2023-03-20 DIAGNOSIS — Z6822 Body mass index (BMI) 22.0-22.9, adult: Secondary | ICD-10-CM | POA: Diagnosis not present

## 2023-03-20 DIAGNOSIS — Z9181 History of falling: Secondary | ICD-10-CM | POA: Diagnosis not present

## 2023-03-20 DIAGNOSIS — Z79899 Other long term (current) drug therapy: Secondary | ICD-10-CM | POA: Diagnosis not present

## 2023-03-20 DIAGNOSIS — M797 Fibromyalgia: Secondary | ICD-10-CM | POA: Diagnosis not present

## 2023-03-20 DIAGNOSIS — F17210 Nicotine dependence, cigarettes, uncomplicated: Secondary | ICD-10-CM | POA: Diagnosis not present

## 2023-03-20 DIAGNOSIS — G43709 Chronic migraine without aura, not intractable, without status migrainosus: Secondary | ICD-10-CM | POA: Diagnosis not present

## 2023-03-24 DIAGNOSIS — Z79899 Other long term (current) drug therapy: Secondary | ICD-10-CM | POA: Diagnosis not present

## 2023-04-01 ENCOUNTER — Telehealth: Payer: Self-pay | Admitting: Acute Care

## 2023-04-01 NOTE — Telephone Encounter (Signed)
 Kandice Robinsons NP has reviewed scan and advises to reach out to PCP to see if they would be willing to order a thyroid US regarding 2.5cm left thyroid nodule. Patient can had annual 12 month LDCT.    IMPRESSION: 1. Lung-RADS 2, benign appearance or behavior. Continue annual screening with low-dose chest CT without contrast in 12 months. 2. Enlarged nodular thyroid with a 2.5 cm left thyroid nodule. Recommend thyroid ultrasound. (Ref: J Am Coll Radiol. 2015 Feb;12(2): 143-50). 3.  Aortic atherosclerosis (ICD10-I70.0). 4.  Emphysema (ICD10-J43.9).     Electronically Signed   By: Leanna Battles M.D.   On: 03/29/2023 11:45

## 2023-04-02 ENCOUNTER — Other Ambulatory Visit: Payer: Self-pay

## 2023-04-02 DIAGNOSIS — F17210 Nicotine dependence, cigarettes, uncomplicated: Secondary | ICD-10-CM

## 2023-04-02 DIAGNOSIS — Z87891 Personal history of nicotine dependence: Secondary | ICD-10-CM

## 2023-04-02 DIAGNOSIS — Z122 Encounter for screening for malignant neoplasm of respiratory organs: Secondary | ICD-10-CM

## 2023-04-02 NOTE — Telephone Encounter (Signed)
 Called and spoke with patient. Reviewed results. Patient agrees to complete one year annual scan again next year. Due 02/2024. Reviewed thyroid nodule. Pt states it was also seen on her CT scan in 2020. Pt will discuss [place] with her PCP. If needed she will have her PCP follow for results. Patient states she should be seeing her soon for a physical. Plan and results to PCP. One year follow up scan order placed.

## 2023-04-08 DIAGNOSIS — M542 Cervicalgia: Secondary | ICD-10-CM | POA: Diagnosis not present

## 2023-04-08 DIAGNOSIS — G8929 Other chronic pain: Secondary | ICD-10-CM | POA: Diagnosis not present

## 2023-04-21 DIAGNOSIS — Z79899 Other long term (current) drug therapy: Secondary | ICD-10-CM | POA: Diagnosis not present

## 2023-04-21 DIAGNOSIS — Z9181 History of falling: Secondary | ICD-10-CM | POA: Diagnosis not present

## 2023-04-21 DIAGNOSIS — M549 Dorsalgia, unspecified: Secondary | ICD-10-CM | POA: Diagnosis not present

## 2023-04-21 DIAGNOSIS — G43709 Chronic migraine without aura, not intractable, without status migrainosus: Secondary | ICD-10-CM | POA: Diagnosis not present

## 2023-04-21 DIAGNOSIS — G8929 Other chronic pain: Secondary | ICD-10-CM | POA: Diagnosis not present

## 2023-04-21 DIAGNOSIS — M542 Cervicalgia: Secondary | ICD-10-CM | POA: Diagnosis not present

## 2023-04-21 DIAGNOSIS — Z6821 Body mass index (BMI) 21.0-21.9, adult: Secondary | ICD-10-CM | POA: Diagnosis not present

## 2023-04-21 DIAGNOSIS — M797 Fibromyalgia: Secondary | ICD-10-CM | POA: Diagnosis not present

## 2023-05-09 DIAGNOSIS — M542 Cervicalgia: Secondary | ICD-10-CM | POA: Diagnosis not present

## 2023-05-09 DIAGNOSIS — G8929 Other chronic pain: Secondary | ICD-10-CM | POA: Diagnosis not present

## 2023-05-21 DIAGNOSIS — M797 Fibromyalgia: Secondary | ICD-10-CM | POA: Diagnosis not present

## 2023-05-21 DIAGNOSIS — Z6821 Body mass index (BMI) 21.0-21.9, adult: Secondary | ICD-10-CM | POA: Diagnosis not present

## 2023-05-21 DIAGNOSIS — Z9181 History of falling: Secondary | ICD-10-CM | POA: Diagnosis not present

## 2023-05-21 DIAGNOSIS — M549 Dorsalgia, unspecified: Secondary | ICD-10-CM | POA: Diagnosis not present

## 2023-05-21 DIAGNOSIS — M542 Cervicalgia: Secondary | ICD-10-CM | POA: Diagnosis not present

## 2023-05-21 DIAGNOSIS — Z79899 Other long term (current) drug therapy: Secondary | ICD-10-CM | POA: Diagnosis not present

## 2023-05-21 DIAGNOSIS — M129 Arthropathy, unspecified: Secondary | ICD-10-CM | POA: Diagnosis not present

## 2023-05-27 DIAGNOSIS — Z79899 Other long term (current) drug therapy: Secondary | ICD-10-CM | POA: Diagnosis not present

## 2023-06-08 DIAGNOSIS — M542 Cervicalgia: Secondary | ICD-10-CM | POA: Diagnosis not present

## 2023-06-08 DIAGNOSIS — G8929 Other chronic pain: Secondary | ICD-10-CM | POA: Diagnosis not present

## 2023-06-21 DIAGNOSIS — Z79899 Other long term (current) drug therapy: Secondary | ICD-10-CM | POA: Diagnosis not present

## 2023-06-21 DIAGNOSIS — G8929 Other chronic pain: Secondary | ICD-10-CM | POA: Diagnosis not present

## 2023-06-21 DIAGNOSIS — F17210 Nicotine dependence, cigarettes, uncomplicated: Secondary | ICD-10-CM | POA: Diagnosis not present

## 2023-06-21 DIAGNOSIS — M797 Fibromyalgia: Secondary | ICD-10-CM | POA: Diagnosis not present

## 2023-06-21 DIAGNOSIS — M4126 Other idiopathic scoliosis, lumbar region: Secondary | ICD-10-CM | POA: Diagnosis not present

## 2023-06-21 DIAGNOSIS — M51362 Other intervertebral disc degeneration, lumbar region with discogenic back pain and lower extremity pain: Secondary | ICD-10-CM | POA: Diagnosis not present

## 2023-06-21 DIAGNOSIS — M549 Dorsalgia, unspecified: Secondary | ICD-10-CM | POA: Diagnosis not present

## 2023-06-21 DIAGNOSIS — Z6821 Body mass index (BMI) 21.0-21.9, adult: Secondary | ICD-10-CM | POA: Diagnosis not present

## 2023-06-21 DIAGNOSIS — G43709 Chronic migraine without aura, not intractable, without status migrainosus: Secondary | ICD-10-CM | POA: Diagnosis not present

## 2023-06-21 DIAGNOSIS — Z9181 History of falling: Secondary | ICD-10-CM | POA: Diagnosis not present

## 2023-06-23 DIAGNOSIS — Z1389 Encounter for screening for other disorder: Secondary | ICD-10-CM | POA: Diagnosis not present

## 2023-06-23 DIAGNOSIS — Z299 Encounter for prophylactic measures, unspecified: Secondary | ICD-10-CM | POA: Diagnosis not present

## 2023-06-23 DIAGNOSIS — F17210 Nicotine dependence, cigarettes, uncomplicated: Secondary | ICD-10-CM | POA: Diagnosis not present

## 2023-06-23 DIAGNOSIS — R52 Pain, unspecified: Secondary | ICD-10-CM | POA: Diagnosis not present

## 2023-06-23 DIAGNOSIS — K219 Gastro-esophageal reflux disease without esophagitis: Secondary | ICD-10-CM | POA: Diagnosis not present

## 2023-06-23 DIAGNOSIS — J439 Emphysema, unspecified: Secondary | ICD-10-CM | POA: Diagnosis not present

## 2023-06-23 DIAGNOSIS — Z0000 Encounter for general adult medical examination without abnormal findings: Secondary | ICD-10-CM | POA: Diagnosis not present

## 2023-06-23 DIAGNOSIS — E7800 Pure hypercholesterolemia, unspecified: Secondary | ICD-10-CM | POA: Diagnosis not present

## 2023-06-23 DIAGNOSIS — Z7189 Other specified counseling: Secondary | ICD-10-CM | POA: Diagnosis not present

## 2023-06-24 DIAGNOSIS — Z79899 Other long term (current) drug therapy: Secondary | ICD-10-CM | POA: Diagnosis not present

## 2023-07-13 DIAGNOSIS — J439 Emphysema, unspecified: Secondary | ICD-10-CM | POA: Diagnosis not present

## 2023-07-13 DIAGNOSIS — R52 Pain, unspecified: Secondary | ICD-10-CM | POA: Diagnosis not present

## 2023-07-13 DIAGNOSIS — Z299 Encounter for prophylactic measures, unspecified: Secondary | ICD-10-CM | POA: Diagnosis not present

## 2023-07-13 DIAGNOSIS — M79674 Pain in right toe(s): Secondary | ICD-10-CM | POA: Diagnosis not present

## 2023-07-13 DIAGNOSIS — S99929A Unspecified injury of unspecified foot, initial encounter: Secondary | ICD-10-CM | POA: Diagnosis not present

## 2023-07-13 DIAGNOSIS — F17210 Nicotine dependence, cigarettes, uncomplicated: Secondary | ICD-10-CM | POA: Diagnosis not present

## 2023-07-16 DIAGNOSIS — E1165 Type 2 diabetes mellitus with hyperglycemia: Secondary | ICD-10-CM | POA: Diagnosis not present

## 2023-07-16 DIAGNOSIS — M542 Cervicalgia: Secondary | ICD-10-CM | POA: Diagnosis not present

## 2023-07-16 DIAGNOSIS — M546 Pain in thoracic spine: Secondary | ICD-10-CM | POA: Diagnosis not present

## 2023-07-16 DIAGNOSIS — F17210 Nicotine dependence, cigarettes, uncomplicated: Secondary | ICD-10-CM | POA: Diagnosis not present

## 2023-07-16 DIAGNOSIS — Z299 Encounter for prophylactic measures, unspecified: Secondary | ICD-10-CM | POA: Diagnosis not present

## 2023-07-16 DIAGNOSIS — J439 Emphysema, unspecified: Secondary | ICD-10-CM | POA: Diagnosis not present

## 2023-07-16 DIAGNOSIS — E041 Nontoxic single thyroid nodule: Secondary | ICD-10-CM | POA: Diagnosis not present

## 2023-07-16 DIAGNOSIS — M47812 Spondylosis without myelopathy or radiculopathy, cervical region: Secondary | ICD-10-CM | POA: Diagnosis not present

## 2023-07-16 DIAGNOSIS — R52 Pain, unspecified: Secondary | ICD-10-CM | POA: Diagnosis not present

## 2023-07-21 DIAGNOSIS — M51362 Other intervertebral disc degeneration, lumbar region with discogenic back pain and lower extremity pain: Secondary | ICD-10-CM | POA: Diagnosis not present

## 2023-07-21 DIAGNOSIS — M4126 Other idiopathic scoliosis, lumbar region: Secondary | ICD-10-CM | POA: Diagnosis not present

## 2023-07-21 DIAGNOSIS — Z9181 History of falling: Secondary | ICD-10-CM | POA: Diagnosis not present

## 2023-07-21 DIAGNOSIS — R2681 Unsteadiness on feet: Secondary | ICD-10-CM | POA: Diagnosis not present

## 2023-07-21 DIAGNOSIS — M797 Fibromyalgia: Secondary | ICD-10-CM | POA: Diagnosis not present

## 2023-07-21 DIAGNOSIS — Z79899 Other long term (current) drug therapy: Secondary | ICD-10-CM | POA: Diagnosis not present

## 2023-07-21 DIAGNOSIS — G8929 Other chronic pain: Secondary | ICD-10-CM | POA: Diagnosis not present

## 2023-07-21 DIAGNOSIS — M549 Dorsalgia, unspecified: Secondary | ICD-10-CM | POA: Diagnosis not present

## 2023-07-21 DIAGNOSIS — G43709 Chronic migraine without aura, not intractable, without status migrainosus: Secondary | ICD-10-CM | POA: Diagnosis not present

## 2023-07-28 DIAGNOSIS — Z79899 Other long term (current) drug therapy: Secondary | ICD-10-CM | POA: Diagnosis not present

## 2023-07-31 DIAGNOSIS — E041 Nontoxic single thyroid nodule: Secondary | ICD-10-CM | POA: Diagnosis not present

## 2023-07-31 DIAGNOSIS — E042 Nontoxic multinodular goiter: Secondary | ICD-10-CM | POA: Diagnosis not present

## 2023-08-17 ENCOUNTER — Other Ambulatory Visit: Payer: Self-pay | Admitting: Internal Medicine

## 2023-08-17 DIAGNOSIS — Z1231 Encounter for screening mammogram for malignant neoplasm of breast: Secondary | ICD-10-CM

## 2023-08-18 ENCOUNTER — Ambulatory Visit
Admission: RE | Admit: 2023-08-18 | Discharge: 2023-08-18 | Disposition: A | Discharge status: Home / Self Care | Payer: Self-pay | Source: Ambulatory Visit | Attending: Internal Medicine | Admitting: Internal Medicine

## 2023-08-18 DIAGNOSIS — Z1231 Encounter for screening mammogram for malignant neoplasm of breast: Secondary | ICD-10-CM

## 2023-08-20 ENCOUNTER — Ambulatory Visit: Admitting: Neurology

## 2023-08-20 ENCOUNTER — Encounter: Payer: Self-pay | Admitting: Neurology

## 2023-08-20 VITALS — BP 120/84 | HR 84 | Ht 68.0 in | Wt 152.4 lb

## 2023-08-20 DIAGNOSIS — G43101 Migraine with aura, not intractable, with status migrainosus: Secondary | ICD-10-CM

## 2023-08-20 DIAGNOSIS — G8929 Other chronic pain: Secondary | ICD-10-CM

## 2023-08-20 MED ORDER — SUMATRIPTAN SUCCINATE 50 MG PO TABS: 50.0000 mg | ORAL_TABLET | ORAL | 5 refills | Status: AC | PRN

## 2023-08-20 NOTE — Progress Notes (Signed)
 Provider:  Dedra Gores, MD  Primary Care Physician:  Maree Isles, MD [address] [place] [address]     Referring Provider:   Jerome Heron Ruth, Pa-c [address] [place],  [place] [address]          Chief Complaint according to patient   Patient presents with:     New Patient (Initial Visit)           HISTORY OF PRESENT ILLNESS:  Patricia Jones is a 68 y.o. female patient who looks much older than her numeric age and is pleasant and cooperative, she is seen upon referral from [facility] where this patient is followed for pain management,  Her PCP is at [facility] ,  Dr Maree.  The patient was referred after a visit from 04-21-2023 and is seen on  08/20/2023  for a Migraine management second opinion.  .  Chief concern according to patient :   prior to pain management.  I saw Dr Milton who treated me for fibromyalgia and headaches .  The migraines reportedly have been more frequent over the last 15 years, but were infrequent as a teenager.   I got them more when I had my period.  I had a total hysterectomy at age 62, after I had 2 babies. . I had infrequent migraines after that, but developed clusters,  and over the last 20 years have had migraines 5-6  days per month.   Changed with Aimovig. Abortive was a sumatriptan  and worked well     Family medical /sleep history: daughter with seizures, MGM, mother and  older sister have migraines, daughter and niece  are affected.    Social history:  Patient is retired by a disability - disabled since  14 years ago secondary to FIBROMYALGIA. Has had multiple falls, blind in the left eye,   Heavy smoke, bronchitis, but denies pneumonia , chronic pain therapy on SOBUTEX and tramadol' all my back and spine is hurting. All muscles hurting    The patient lives in a household with significant other . Family status is divorced , with 2 daughters  born when she was 82 and 80.    Pets are  present. 3  dogs, 2 birds,  Tobacco use; 1 ppd.  ETOH use ; none ,  Caffeine intake in form of Coffee( 2-4 / day) Soda( /) Tea ( /) or energy drinks Exercise in form of - walking the dogs.    The patient's worst headaches start in the morning, she is nauseated, pressure behind her eyes, she reports a visual aura with blurring.   Photophobia, phonophobia, but not triggered by smells. Nauseated when intensity 7-10 or even 10-10. . More often pressure retro- orbital and stabbing sensation in the right angle of the eye, right at the nasal bridge and under the eyebrow.  Also back of the skull hurts.      Review of Systems: Out of a complete 14 system review, the patient complains of only the following symptoms, and all other reviewed systems are negative.:   Above    Social History   Socioeconomic History   Marital status: Divorced    Spouse name: Not on file   Number of children: 2   Years of education: Not on file   Highest education level: Not on file  Occupational History   Occupation: disabled     Comment: previously Charity fundraiser @ [employer]  Tobacco Use   Smoking  status: Every Day    Current packs/day: 1.00    Average packs/day: 1 pack/day for 50.0 years (50.0 ttl pk-yrs)    Types: Cigarettes    Start date: 12/25/1973   Smokeless tobacco: Never  Vaping Use   Vaping status: Some Days   Substances: Nicotine, THC  Substance and Sexual Activity   Alcohol use: No    Comment: heavy etoh x 30 yrs, quit 14 yrs ago   Drug use: Not Currently    Types: Marijuana    Comment: last used 06/12/21   Sexual activity: Yes    Partners: Male    Comment: [facility]  Other Topics Concern   Not on file  Social History Narrative   Retired Charity fundraiser    Social Drivers of Corporate investment banker Strain: Not on file  Food Insecurity: Not on file  Transportation Needs: Not on file  Physical Activity: Not on file  Stress: Not on file  Social Connections: Not on file    Family History  Problem Relation Age of Onset    Aneurysm Mother    Heart disease Mother    Coronary artery disease Father    Stroke Father    Heart disease Father    Cervical cancer Sister    Ovarian cancer Sister    Colon cancer Maternal Grandmother        greater than age 50. was her great grandmother   Diabetes Maternal Aunt     Past Medical History:  Diagnosis Date   Allergy    Chronic migraine without aura, with intractable migraine, so stated, without mention of status migrainosus 12/08/2012   Depression    DJD (degenerative joint disease)    Fibromyalgia    GERD (gastroesophageal reflux disease)    Hepatitis C 1980   symptomatic w/ jaundice, NEGATIVE HCV RNA  08/2005, positive hepatitis C antibody but negative HCVRNA in 2019   Hypersomnia with sleep apnea, unspecified    Insomnia    Lumbago 12/08/2012   Migraines    Myalgia and myositis, unspecified 12/08/2012   Overactive bladder    S/P colonoscopy 10/29/1999   Dr Belvin polyps-hyperplastic   S/P colonoscopy 09/12/2005   4 hyperplastic polyps   Sleep apnea    CPAP   Tobacco dependence    Type II or unspecified type diabetes mellitus without mention of complication, not stated as uncontrolled    Unspecified hereditary and idiopathic peripheral neuropathy     Past Surgical History:  Procedure Laterality Date   ABDOMINAL HYSTERECTOMY  10/30/1999   TAH/BSO   BALLOON DILATION N/A 06/17/2021   Procedure: BALLOON DILATION;  Surgeon: Cindie Carlin POUR, DO;  [facility];  Service: Endoscopy;  Laterality: N/A;   BIOPSY  06/17/2021   Procedure: BIOPSY;  Surgeon: Cindie Carlin POUR, DO;  [facility];  Service: Endoscopy;;   COLONOSCOPY     2007, 2001, hyperplastic polyps   COLONOSCOPY WITH PROPOFOL  N/A 10/20/2017   Procedure: COLONOSCOPY WITH PROPOFOL ;  Surgeon: Harvey Margo CROME, MD;  [facility];  Service: Endoscopy;  Laterality: N/A;  10:45am   COLONOSCOPY WITH PROPOFOL  N/A 06/17/2021   Procedure: COLONOSCOPY WITH PROPOFOL ;  Surgeon:  Cindie Carlin POUR, DO;  [facility];  Service: Endoscopy;  Laterality: N/A;  10:30am   complete hyst  2001   ESOPHAGOGASTRODUODENOSCOPY (EGD) WITH PROPOFOL  N/A 10/20/2017   Procedure: ESOPHAGOGASTRODUODENOSCOPY (EGD) WITH PROPOFOL ;  Surgeon: Harvey Margo CROME, MD;  [facility];  Service: Endoscopy;  Laterality:  N/A;   ESOPHAGOGASTRODUODENOSCOPY (EGD) WITH PROPOFOL  N/A 06/17/2021   Procedure: ESOPHAGOGASTRODUODENOSCOPY (EGD) WITH PROPOFOL ;  Surgeon: Cindie Carlin POUR, DO;  [facility];  Service: Endoscopy;  Laterality: N/A;   EXPLORATORY LAPAROTOMY     Ruptured ovarian cyst with hemorrhage in her 67s   FOOT SURGERY Left    HIP SURGERY     laproscopic left   PELVIC LAPAROSCOPY     Multiple for ovarian cysts and endometriosis, infertility issues   POLYPECTOMY  10/20/2017   Procedure: POLYPECTOMY;  Surgeon: Harvey Margo CROME, MD;  [facility];  Service: Endoscopy;;   POLYPECTOMY  06/17/2021   Procedure: POLYPECTOMY INTESTINAL;  Surgeon: Cindie Carlin POUR, DO;  [facility];  Service: Endoscopy;;   SAVORY DILATION N/A 10/20/2017   Procedure: SAVORY DILATION;  Surgeon: Harvey Margo CROME, MD;  [facility];  Service: Endoscopy;  Laterality: N/A;   SINUS SURGERY WITH INSTATRAK       Current Outpatient Medications on File Prior to Visit  Medication Sig Dispense Refill   AIMOVIG 140 MG/ML SOAJ Inject 140 mg into the skin every 30 (thirty) days.     albuterol  (PROVENTIL ) (2.5 MG/3ML) 0.083% nebulizer solution Take 2.5 mg by nebulization every 6 (six) hours as needed for wheezing or shortness of breath.     albuterol  (VENTOLIN  HFA) 108 (90 Base) MCG/ACT inhaler INHALE 2 PUFFS INTO THE LUNGS EVERY 4 HOURS AS NEEDED FOR WHEEZING OR SHORTNESS OF BREATH. 8.5 g 0   amitriptyline (ELAVIL) 10 MG tablet Take 10 mg by mouth at bedtime.     baclofen (LIORESAL) 10 MG tablet Take 20 mg by mouth 3 (three) times daily as needed for muscle spasms.     BREO  ELLIPTA 100-25 MCG/ACT AEPB INHALE 1 PUFF INTO LUNGS DAILY. (Patient taking differently: Inhale 1 puff into the lungs at bedtime.) 60 each 0   buprenorphine (SUBUTEX) 2 MG SUBL SL tablet Place 2 mg under the tongue in the morning and at bedtime. May hold 2nd dose if not having terrible pain day     butalbital-acetaminophen -caffeine (FIORICET, ESGIC) 50-325-40 MG per tablet Take 1-2 tablets by mouth every 4 (four) hours as needed for headache or migraine.     Calcium Carb-Cholecalciferol 600-20 MG-MCG CHEW Chew 1-2 tablets by mouth See admin instructions. Alternate between taking 2 tablets on day & then taking 1 tablet the next day     diazepam (VALIUM) 10 MG tablet Take 10 mg by mouth at bedtime as needed (spasms/sleep).     ergocalciferol  (VITAMIN D2) 1.25 MG (50000 UT) capsule Take 50,000 Units by mouth every Friday.     FLUoxetine (PROZAC) 40 MG capsule Take 40 mg by mouth every evening.     fluticasone  (FLONASE ) 50 MCG/ACT nasal spray Place 1-2 sprays into both nostrils daily as needed for allergies or rhinitis.     gabapentin (NEURONTIN) 600 MG tablet Take 600 mg by mouth 3 (three) times daily.     GAVILYTE-G 236 g solution Take 4,000 mLs by mouth as directed.     ibuprofen  (ADVIL ) 800 MG tablet Take 1 tablet (800 mg total) by mouth every 8 (eight) hours as needed. 30 tablet 0   pantoprazole  (PROTONIX ) 40 MG tablet Take 1 tablet (40 mg total) by mouth 2 (two) times daily. 60 tablet 11   traMADol (ULTRAM) 50 MG tablet Take 50-100 mg by mouth every 6 (six) hours as needed (pain.).     No current facility-administered medications on file  prior to visit.    Allergies  Allergen Reactions   Other     Vicryl Suture-redness/swelling   Sulfamethoxazole-Trimethoprim Dermatitis   Penicillins Rash and Dermatitis    Childhood reaction     DIAGNOSTIC DATA (LABS, IMAGING, TESTING) - I reviewed patient records, labs, notes, testing and imaging myself where available.  Lab Results  Component Value  Date   WBC 9.8 11/08/2019   HGB 15.3 11/08/2019   HCT 47.9 (H) 11/08/2019   MCV 88.9 11/08/2019   PLT 277 11/08/2019      Component Value Date/Time   NA 137 06/13/2021 1004   K 3.9 06/13/2021 1004   CL 100 06/13/2021 1004   CO2 31 06/13/2021 1004   GLUCOSE 87 06/13/2021 1004   BUN 15 06/13/2021 1004   CREATININE 0.62 06/13/2021 1004   CREATININE 0.59 11/08/2019 1258   CALCIUM 9.2 06/13/2021 1004   CALCIUM 8.5 01/23/2011 1306   PROT 7.1 11/08/2019 1258   ALBUMIN 4.1 08/28/2019 1947   AST 16 11/08/2019 1258   ALT 8 11/08/2019 1258   ALKPHOS 74 08/28/2019 1947   BILITOT 0.5 11/08/2019 1258   GFRNONAA >60 06/13/2021 1004   GFRNONAA 95 08/12/2018 1220   GFRAA >60 08/28/2019 1947   GFRAA 110 08/12/2018 1220   Lab Results  Component Value Date   CHOL 163 11/26/2018   HDL 41 (L) 11/26/2018   LDLCALC 106 (H) 11/26/2018   TRIG 73 11/26/2018   CHOLHDL 4.0 11/26/2018   Lab Results  Component Value Date   HGBA1C 5.5 11/08/2019   No results found for: VITAMINB12 Lab Results  Component Value Date   TSH 0.74 11/26/2018    PHYSICAL EXAM:  Today's Vitals   08/20/23 1416  BP: 120/84  Pulse: 84  Weight: 152 lb 6.4 oz (69.1 kg)  Height: 5' 8 (1.727 m)   Body mass index is 23.17 kg/m.   Wt Readings from Last 3 Encounters:  08/20/23 152 lb 6.4 oz (69.1 kg)  06/13/21 145 lb (65.8 kg)  05/23/21 141 lb 6.4 oz (64.1 kg)     Ht Readings from Last 3 Encounters:  08/20/23 5' 8 (1.727 m)  06/13/21 5' 8 (1.727 m)  05/23/21 5' 8 (1.727 m)      General: The patient is awake, alert and appears not in acute distress. Dressed in tank top and fatigue cargo pants.   Dental status: no biological teeth .  Cardiovascular:  Regular rate and cardiac rhythm by pulse,  without distended neck veins. Respiratory: Lungs are clear to auscultation.  Skin tattooed, Without evidence of ankle edema, or rash. Trunk: The patient's posture is erect.   NEUROLOGIC EXAM: The patient is  awake and alert, oriented to place and time.    Attention span & concentration ability appears normal.  Speech is fluent,  without  dysarthria, but raspy voice, dysphonia .  Chronic sinus issues.  Mood and affect are appropriate.   Cranial nerves: no loss of smell or taste reported  Pupils are equal and briskly reactive to light.  Funduscopic exam deferred. .  Extraocular movements in vertical and horizontal planes were intact and without nystagmus. Legally blind on the left, left sided ptosis.  Visual fields of the right eye by finger perimetry are intact. Hearing was intact to soft voice .   Facial sensation intact to fine touch.  Facial motor strength is symmetric  but the patient broke the right orbital skull and there is assymmetry-  and tongue and uvula move midline.  Neck ROM : rotation, tilt and flexion extension were normal for age and shoulder shrug was symmetrical.    Motor exam:  Symmetric bulk, tone and ROM.   Normal tone without cog wheeling, symmetric grip strength .   Sensory:  deferred.    Coordination: Rapid alternating movements in the fingers/hands were of normal speed.  The Finger-to-nose maneuver was intact without evidence of ataxia, dysmetria  and action / target  tremor.   Gait and station:  scoliosis, history of pelvic fracture. Patient could rise unassisted from a seated position, walked without assistive device.   Stooped.      ASSESSMENT AND PLAN 68 y.o. year old female  here with:    1) lifelong  migraines that were chronic but have been less frequent on Aimvig q 30 days  injections.  Prevention medication: Neurontin is a good pain prevention for headaches, migraines.   She could also consider verapamil at night 120 mg XR po.  Elavil may be increased from 10 mg to 20 mg at night its also a migraine preventing medication.    2) Sumatriptan  for abortive therapy is working for her.  Its not listed on her current med list, the patient doesn't know why.   50 mg 10 per months , no refills from here.   3) I will not use tramadol for headaches nor butalbutal.   Plan : The current therapy  is effective and needs not to be changed. It can be maintained by PCP .  I do not treat fibromyalgia, nor chronic pain syndromes.  P[facility] [place] has been seeing the patient. - please address any sleep and breathing issues with them.  I d/c Valium from her list.    I do not plan to follow up :   I would like to thank Maree Isles, MD and Jerome Heron Ruth, Pa-c [address] [place], [place],  [place] [address] for allowing me to meet with and to take care of this pleasant patient.   Discussion of sleep hygiene setting bedtime and rise time,  hot shower  before bed time, no screen light in the bedroom, the bedroom should be cool, quiet and dark. Night lights should illuminate the floor not shine into your eyes. Golden glow  light is less intrusive than blue or cold light.  Read in a book with pages, not on a device. Consider audio books and soothing  sound -scapes.   After spending a total time of  40  minutes face to face and additional time for physical and neurologic examination, review of laboratory studies,  personal review of imaging studies, reports and results of other testing and review of referral information / records as far as provided in visit,   Electronically signed by: Dedra Gores, MD 08/20/2023 2:25 PM  [facility] Board certified by The [employer] of Sleep Medicine and Diplomate of the [employer] of [employer]. Board certified In Neurology through the ABPN, Fellow of the [employer] of [employer].

## 2023-08-20 NOTE — Patient Instructions (Signed)
 ASSESSMENT AND PLAN 68 y.o. year old female  here with:     1) lifelong  migraines that were chronic but have been less frequent on Aimvig q 30 days  injections.  Prevention medication: Neurontin is a good pain prevention for headaches, migraines.   She could also consider verapamil at night 120 mg XR po.  Elavil may be increased from 10 mg to 20 mg at night its also a migraine preventing medication.      2) Sumatriptan  for abortive therapy is working for her.  Its not listed on her current med list, the patient doesn't know why.  50 mg 10 per months , no refills from here.    3) I will not use tramadol for headaches nor butalbutal.    Plan : The current therapy  is effective and needs not to be changed. It can be maintained by PCP .I do not plan to follow up :   I do not treat fibromyalgia, nor chronic pain syndromes.   COPD smoking cessation :  P[facility] [place] has been seeing the patient. - please address any sleep and breathing issues with them.  I d/c Valium from her list.      I do not plan to follow up :    I would like to thank Maree Isles, MD and Jerome Heron Ruth, Pa-c [address] [place], [place],  [place] [address] for allowing me to meet with and to take care of this pleasant patient.    Discussion of sleep hygiene setting bedtime and rise time,  hot shower  before bed time, no screen light in the bedroom, the bedroom should be cool, quiet and dark. Night lights should illuminate the floor not shine into your eyes. Golden glow  light is less intrusive than blue or cold light.  Read in a book with pages, not on a device. Consider audio books and soothing  sound -scapes.

## 2023-08-25 DIAGNOSIS — M549 Dorsalgia, unspecified: Secondary | ICD-10-CM | POA: Diagnosis not present

## 2023-08-25 DIAGNOSIS — M51362 Other intervertebral disc degeneration, lumbar region with discogenic back pain and lower extremity pain: Secondary | ICD-10-CM | POA: Diagnosis not present

## 2023-08-25 DIAGNOSIS — M4126 Other idiopathic scoliosis, lumbar region: Secondary | ICD-10-CM | POA: Diagnosis not present

## 2023-08-25 DIAGNOSIS — G8929 Other chronic pain: Secondary | ICD-10-CM | POA: Diagnosis not present

## 2023-08-25 DIAGNOSIS — M797 Fibromyalgia: Secondary | ICD-10-CM | POA: Diagnosis not present

## 2023-08-25 DIAGNOSIS — Z79899 Other long term (current) drug therapy: Secondary | ICD-10-CM | POA: Diagnosis not present

## 2023-08-25 DIAGNOSIS — Z9181 History of falling: Secondary | ICD-10-CM | POA: Diagnosis not present

## 2023-08-31 ENCOUNTER — Telehealth: Payer: Self-pay | Admitting: Neurology

## 2023-08-31 NOTE — Telephone Encounter (Signed)
 Late entry: Patient sent in after our message through our call service on 08/28/2023.  I was able to connect with the patient on 08/28/2023.  She reported that she has had a migraine since earlier in the week, for about 5 days.  She reported that Imitrex  has not helped and that she previously tried Fioricet but it was discontinued by Dr. Chalice.  Patient is also on Aimovig injections.  She was advised to proceed to urgent care or emergency room for acute management of a prolonged migraine.  She was advised that Fioricet or the generic version are not recommended for chronic migraine management.  She demonstrated understanding and agreement with the plan.  Upon chart review, she is on multiple other medications including potentially sedating medications.  I did not see any urgent care or emergency room visit under encounters at this time.

## 2023-08-31 NOTE — Telephone Encounter (Signed)
 Thank you Dr Buck and Catheryn Alexander, RN for reaching out to this new patient.

## 2023-08-31 NOTE — Telephone Encounter (Signed)
 Attempted to call Pt to f/u on migraine and treatment recommended by on call provider. No answer, LVM for call back.

## 2023-09-01 NOTE — Telephone Encounter (Signed)
 Attempted to call Pt in f/u of migraine and provider's message sent via [facility]. No answer, LVM for Pt to call back.

## 2023-09-25 DIAGNOSIS — M4126 Other idiopathic scoliosis, lumbar region: Secondary | ICD-10-CM | POA: Diagnosis not present

## 2023-09-25 DIAGNOSIS — G43709 Chronic migraine without aura, not intractable, without status migrainosus: Secondary | ICD-10-CM | POA: Diagnosis not present

## 2023-09-25 DIAGNOSIS — M549 Dorsalgia, unspecified: Secondary | ICD-10-CM | POA: Diagnosis not present

## 2023-09-25 DIAGNOSIS — R2681 Unsteadiness on feet: Secondary | ICD-10-CM | POA: Diagnosis not present

## 2023-09-25 DIAGNOSIS — G8929 Other chronic pain: Secondary | ICD-10-CM | POA: Diagnosis not present

## 2023-09-25 DIAGNOSIS — M797 Fibromyalgia: Secondary | ICD-10-CM | POA: Diagnosis not present

## 2023-09-25 DIAGNOSIS — Z9181 History of falling: Secondary | ICD-10-CM | POA: Diagnosis not present

## 2023-09-25 DIAGNOSIS — Z79899 Other long term (current) drug therapy: Secondary | ICD-10-CM | POA: Diagnosis not present

## 2023-09-25 DIAGNOSIS — M51362 Other intervertebral disc degeneration, lumbar region with discogenic back pain and lower extremity pain: Secondary | ICD-10-CM | POA: Diagnosis not present

## 2023-10-02 DIAGNOSIS — M51362 Other intervertebral disc degeneration, lumbar region with discogenic back pain and lower extremity pain: Secondary | ICD-10-CM | POA: Diagnosis not present

## 2023-10-02 DIAGNOSIS — Z6822 Body mass index (BMI) 22.0-22.9, adult: Secondary | ICD-10-CM | POA: Diagnosis not present

## 2023-10-02 DIAGNOSIS — G43709 Chronic migraine without aura, not intractable, without status migrainosus: Secondary | ICD-10-CM | POA: Diagnosis not present

## 2023-10-02 DIAGNOSIS — M797 Fibromyalgia: Secondary | ICD-10-CM | POA: Diagnosis not present

## 2023-10-02 DIAGNOSIS — Z79899 Other long term (current) drug therapy: Secondary | ICD-10-CM | POA: Diagnosis not present

## 2023-10-02 DIAGNOSIS — M4126 Other idiopathic scoliosis, lumbar region: Secondary | ICD-10-CM | POA: Diagnosis not present

## 2023-10-02 DIAGNOSIS — R2681 Unsteadiness on feet: Secondary | ICD-10-CM | POA: Diagnosis not present

## 2023-10-02 DIAGNOSIS — Z9181 History of falling: Secondary | ICD-10-CM | POA: Diagnosis not present

## 2023-10-28 DIAGNOSIS — Z9181 History of falling: Secondary | ICD-10-CM | POA: Diagnosis not present

## 2023-10-28 DIAGNOSIS — M4126 Other idiopathic scoliosis, lumbar region: Secondary | ICD-10-CM | POA: Diagnosis not present

## 2023-10-28 DIAGNOSIS — R2681 Unsteadiness on feet: Secondary | ICD-10-CM | POA: Diagnosis not present

## 2023-10-28 DIAGNOSIS — M51362 Other intervertebral disc degeneration, lumbar region with discogenic back pain and lower extremity pain: Secondary | ICD-10-CM | POA: Diagnosis not present

## 2023-10-28 DIAGNOSIS — G43709 Chronic migraine without aura, not intractable, without status migrainosus: Secondary | ICD-10-CM | POA: Diagnosis not present

## 2023-10-28 DIAGNOSIS — Z79899 Other long term (current) drug therapy: Secondary | ICD-10-CM | POA: Diagnosis not present

## 2023-10-28 DIAGNOSIS — M797 Fibromyalgia: Secondary | ICD-10-CM | POA: Diagnosis not present

## 2023-10-30 ENCOUNTER — Other Ambulatory Visit: Payer: Self-pay | Admitting: Internal Medicine

## 2023-11-03 DIAGNOSIS — Z79899 Other long term (current) drug therapy: Secondary | ICD-10-CM | POA: Diagnosis not present

## 2023-11-16 ENCOUNTER — Encounter: Payer: Self-pay | Admitting: Radiology

## 2024-01-21 ENCOUNTER — Other Ambulatory Visit (HOSPITAL_COMMUNITY): Payer: Self-pay | Admitting: Internal Medicine

## 2024-01-25 ENCOUNTER — Encounter: Payer: Self-pay | Admitting: Internal Medicine

## 2024-01-25 NOTE — Progress Notes (Signed)
 Jubbonti referral received.  CMP on 01/04/2024 (attached to referral). Calcium wnl on this date  Sherry Pennant, PharmD, MPH, BCPS, CPP Clinical Pharmacist

## 2024-02-04 ENCOUNTER — Telehealth: Payer: Self-pay

## 2024-02-04 ENCOUNTER — Encounter: Payer: Self-pay | Admitting: Internal Medicine

## 2024-02-04 NOTE — Telephone Encounter (Signed)
 Auth Submission: APPROVED Site of care: Site of care: [facility] Payer: [employer] medicare Medication & CPT/J Code(s) submitted: Prolia (Denosumab) [id number] Diagnosis Code:  Route of submission (phone, fax, portal): portal Phone # Fax # Auth type: Buy/Bill PB Units/visits requested: 60mg  q51months x 2 doses Reference number: [id number] Approval from: 02/03/24 to 01/12/25

## 2024-02-09 ENCOUNTER — Telehealth: Payer: Self-pay

## 2024-02-09 NOTE — Telephone Encounter (Signed)
 Auth Submission: NO AUTH NEEDED Site of care: Site of care: [facility] Payer: human[employer] medicare Medication & CPT/J Code(s) submitted: Jubbonti (denosumab-bbdz) [phone] Diagnosis Code:  Route of submission (phone, fax, portal): portal/phone Phone # Fax # Auth type: Buy/Bill PB Units/visits requested: 60mg  q56months x 2 doses Reference number: [id number] Approval from: 02/09/24 to 01/12/25

## 2024-02-18 ENCOUNTER — Ambulatory Visit: Payer: Self-pay

## 2024-02-29 ENCOUNTER — Ambulatory Visit: Payer: Self-pay
